# Patient Record
Sex: Female | Born: 1955
Health system: Southern US, Community
[De-identification: ages and names within clinical notes are randomized; demographics above are authoritative.]

## PROBLEM LIST (undated history)

## (undated) DIAGNOSIS — E785 Hyperlipidemia, unspecified: Secondary | ICD-10-CM

## (undated) DIAGNOSIS — E559 Vitamin D deficiency, unspecified: Secondary | ICD-10-CM

## (undated) DIAGNOSIS — Z973 Presence of spectacles and contact lenses: Secondary | ICD-10-CM

## (undated) DIAGNOSIS — G8929 Other chronic pain: Secondary | ICD-10-CM

## (undated) DIAGNOSIS — G2581 Restless legs syndrome: Secondary | ICD-10-CM

## (undated) DIAGNOSIS — K219 Gastro-esophageal reflux disease without esophagitis: Secondary | ICD-10-CM

## (undated) DIAGNOSIS — T7840XA Allergy, unspecified, initial encounter: Secondary | ICD-10-CM

## (undated) DIAGNOSIS — G43909 Migraine, unspecified, not intractable, without status migrainosus: Secondary | ICD-10-CM

## (undated) DIAGNOSIS — U071 COVID-19: Secondary | ICD-10-CM

## (undated) DIAGNOSIS — N83209 Unspecified ovarian cyst, unspecified side: Secondary | ICD-10-CM

## (undated) DIAGNOSIS — Z860101 Personal history of adenomatous and serrated colon polyps: Secondary | ICD-10-CM

## (undated) DIAGNOSIS — I8390 Asymptomatic varicose veins of unspecified lower extremity: Secondary | ICD-10-CM

## (undated) DIAGNOSIS — N838 Other noninflammatory disorders of ovary, fallopian tube and broad ligament: Secondary | ICD-10-CM

## (undated) DIAGNOSIS — N950 Postmenopausal bleeding: Secondary | ICD-10-CM

## (undated) DIAGNOSIS — R011 Cardiac murmur, unspecified: Secondary | ICD-10-CM

## (undated) DIAGNOSIS — J302 Other seasonal allergic rhinitis: Secondary | ICD-10-CM

## (undated) DIAGNOSIS — M8580 Other specified disorders of bone density and structure, unspecified site: Secondary | ICD-10-CM

## (undated) HISTORY — PX: DILATION AND CURETTAGE OF UTERUS: SHX78

## (undated) HISTORY — DX: Unspecified ovarian cyst, unspecified side: N83.209

## (undated) HISTORY — DX: Migraine, unspecified, not intractable, without status migrainosus: G43.909

## (undated) HISTORY — PX: APPENDECTOMY: SHX54

## (undated) HISTORY — DX: Vitamin D deficiency, unspecified: E55.9

## (undated) HISTORY — DX: Restless legs syndrome: G25.81

## (undated) HISTORY — DX: COVID-19: U07.1

## (undated) HISTORY — DX: Asymptomatic varicose veins of unspecified lower extremity: I83.90

## (undated) HISTORY — PX: BREAST BIOPSY: SHX20

## (undated) HISTORY — DX: Allergy, unspecified, initial encounter: T78.40XA

## (undated) HISTORY — PX: POLYPECTOMY: SHX149

## (undated) HISTORY — DX: Cardiac murmur, unspecified: R01.1

---

## 1989-05-12 HISTORY — PX: APPENDECTOMY: SHX54

## 2008-05-12 HISTORY — PX: CARPAL TUNNEL RELEASE: SHX101

## 2009-05-12 HISTORY — PX: FOOT SURGERY: SHX648

## 2009-05-12 HISTORY — PX: BUNIONECTOMY: SHX129

## 2010-05-12 HISTORY — PX: BREAST SURGERY: SHX581

## 2010-05-12 LAB — HM COLONOSCOPY: HM Colonoscopy: NORMAL

## 2011-02-12 DIAGNOSIS — I8390 Asymptomatic varicose veins of unspecified lower extremity: Secondary | ICD-10-CM | POA: Insufficient documentation

## 2011-02-12 DIAGNOSIS — J302 Other seasonal allergic rhinitis: Secondary | ICD-10-CM | POA: Insufficient documentation

## 2011-02-12 DIAGNOSIS — N926 Irregular menstruation, unspecified: Secondary | ICD-10-CM | POA: Insufficient documentation

## 2011-02-12 DIAGNOSIS — K219 Gastro-esophageal reflux disease without esophagitis: Secondary | ICD-10-CM | POA: Insufficient documentation

## 2011-10-13 DIAGNOSIS — K581 Irritable bowel syndrome with constipation: Secondary | ICD-10-CM | POA: Insufficient documentation

## 2013-07-10 LAB — HM MAMMOGRAPHY

## 2014-09-04 ENCOUNTER — Encounter: Payer: Self-pay | Admitting: *Deleted

## 2014-09-04 ENCOUNTER — Telehealth: Payer: Self-pay | Admitting: *Deleted

## 2014-09-04 NOTE — Telephone Encounter (Signed)
Pre-Visit Call completed with patient and chart updated.   Pre-Visit Info documented in Specialty Comments under SnapShot.    

## 2014-09-04 NOTE — Addendum Note (Signed)
Addended by: Leticia Penna A on: 09/04/2014 02:27 PM   Modules accepted: Medications

## 2014-09-04 NOTE — Telephone Encounter (Signed)
Unable to reach patient at time of Pre-Visit Call.  Left message for patient to return call when available.    

## 2014-09-05 ENCOUNTER — Ambulatory Visit (INDEPENDENT_AMBULATORY_CARE_PROVIDER_SITE_OTHER): Payer: Federal, State, Local not specified - PPO | Admitting: Family

## 2014-09-05 ENCOUNTER — Encounter: Payer: Self-pay | Admitting: *Deleted

## 2014-09-05 VITALS — BP 120/86 | HR 85 | Temp 98.0°F | Resp 16 | Ht 64.25 in | Wt 134.6 lb

## 2014-09-05 DIAGNOSIS — G43909 Migraine, unspecified, not intractable, without status migrainosus: Secondary | ICD-10-CM | POA: Insufficient documentation

## 2014-09-05 DIAGNOSIS — N3000 Acute cystitis without hematuria: Secondary | ICD-10-CM

## 2014-09-05 DIAGNOSIS — N390 Urinary tract infection, site not specified: Secondary | ICD-10-CM | POA: Insufficient documentation

## 2014-09-05 DIAGNOSIS — I872 Venous insufficiency (chronic) (peripheral): Secondary | ICD-10-CM

## 2014-09-05 DIAGNOSIS — N83209 Unspecified ovarian cyst, unspecified side: Secondary | ICD-10-CM

## 2014-09-05 DIAGNOSIS — R300 Dysuria: Secondary | ICD-10-CM

## 2014-09-05 DIAGNOSIS — N8320 Unspecified ovarian cysts: Secondary | ICD-10-CM

## 2014-09-05 DIAGNOSIS — G43809 Other migraine, not intractable, without status migrainosus: Secondary | ICD-10-CM

## 2014-09-05 LAB — POCT URINALYSIS DIPSTICK
Bilirubin, UA: NEGATIVE
Blood, UA: NEGATIVE
Glucose, UA: NEGATIVE
Ketones, UA: NEGATIVE
Nitrite, UA: NEGATIVE
Protein, UA: NEGATIVE
Spec Grav, UA: 1.01
Urobilinogen, UA: NEGATIVE
pH, UA: 7.5

## 2014-09-05 MED ORDER — RIZATRIPTAN BENZOATE 10 MG PO TABS: 10.0000 mg | ORAL_TABLET | ORAL | Status: DC | PRN

## 2014-09-05 MED ORDER — CIPROFLOXACIN HCL 500 MG PO TABS: 500.0000 mg | ORAL_TABLET | Freq: Two times a day (BID) | ORAL | Status: DC

## 2014-09-05 NOTE — Assessment & Plan Note (Signed)
UA + nitrite, will send for culture, start cipro.

## 2014-09-05 NOTE — Patient Instructions (Signed)
You will be contacted about your referral to vascular and to GYN. Please schedule a complete physical at your convenience. Welcome to [facility]!

## 2014-09-05 NOTE — Progress Notes (Signed)
Subjective:    Patient ID: Joanna Lambert, female    DOB: November 14, 1955, 59 y.o.   MRN: [id number]  HPI  Joanna Lambert is a 59 yr old female who presents today to establish care.    Dysuria-  Reports dysuria since last night. Urinalysis shows positive leuks.  Reports that it feels likey her urinary stream is "very fine and not coming out the right way.  Notes a general lower abdominal discomfort. Denies hematuria, fever or unusual low back pain.    Hx ovarian cyst- reports hx of 5cm ovarian cyst back about 1.5 years ago. Was told "no concern of CA" had serial ultrasounds and reports that [place] went down to 1 cm.    Circulation concern-  Reports that she had bilateral LE ultrasounds of her LE extremities and was told that she should have endovenous laser therapy.    Migraines-reports hx of "menstrual migraines". Less frequent since menopause but lasts 3 days instead of 1 day like it used to. Now has one every 6 weeks. Reports that she does get relief from maxalt but usually needs 2 doses.     Review of Systems  Constitutional: Negative for unexpected weight change.  HENT: Negative for hearing loss and rhinorrhea.   Eyes: Negative for visual disturbance.  Respiratory: Negative for cough.   Cardiovascular: Negative for leg swelling.  Gastrointestinal: Negative for nausea, diarrhea and constipation.  Genitourinary: Positive for dysuria. Negative for hematuria.  Musculoskeletal: Negative for myalgias and arthralgias.  Skin: Negative for rash.  Neurological: Positive for headaches.  Hematological: Negative for adenopathy.  Psychiatric/Behavioral: Negative for dysphoric mood and agitation.   Past Medical History  Diagnosis Date  . Allergy     seasonal  . Migraines   . Ovarian cyst     right  . Restless legs     History   Social History  . Marital Status: Married    Spouse Name: N/A  . Number of Children: N/A  . Years of Education: N/A   Occupational History  . Not on file.   Social  History Main Topics  . Smoking status: Never Smoker   . Smokeless tobacco: Never Used  . Alcohol Use: 1.2 oz/week    2 Glasses of wine per week  . Drug Use: No  . Sexual Activity: Not on file   Other Topics Concern  . 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      Past Surgical History  Procedure Laterality Date  . Foot surgery Right 2011    bunion removal  . Appendectomy    . Breast surgery Right 2012    biopsy, benign cyst  . Carpal tunnel release Bilateral 2010    Family History  Problem Relation Age of Onset  . Diabetes Mother   . Hypertension Mother 54  . Hyperlipidemia Mother   . Heart disease Father 14    pacemaker  . Atrial fibrillation Father 33    Allergies  Allergen Reactions  . Adhesive [Tape] Rash    Current Outpatient Prescriptions on File Prior to Visit  Medication Sig Dispense Refill  . aspirin 81 MG tablet Take 81 mg by mouth daily.    . Cholecalciferol (VITAMIN D) 2000 UNITS tablet Take 2,000 Units by mouth daily.    Marland Kitchen  rizatriptan (MAXALT) 10 MG tablet Take 10 mg by mouth as needed for migraine. May repeat in 2 hours if needed     No current facility-administered medications on file prior to visit.    BP 120/86 mmHg  Pulse 85  Temp(Src) 98 F (36.7 C) (Oral)  Resp 16  Ht 5' 4.25" (1.632 m)  Wt 134 lb 9.6 oz (61.054 kg)  BMI 22.92 kg/m2  SpO2 99%  LMP 05/12/2013       Objective:   Physical Exam  Constitutional: She is oriented to person, place, and time. She appears well-developed and well-nourished.  HENT:  Head: Normocephalic and atraumatic.  Right Ear: Tympanic membrane and ear canal normal.  Left Ear: Tympanic membrane and ear canal normal.  Cardiovascular: Normal rate, regular rhythm and normal heart sounds.   No murmur heard. Pulmonary/Chest:  Effort normal and breath sounds normal. No respiratory distress. She has no wheezes.  Abdominal: Soft. Bowel sounds are normal. She exhibits no distension. There is no tenderness.  Musculoskeletal: She exhibits no edema.  Lymphadenopathy:    She has no cervical adenopathy.  Neurological: She is alert and oriented to person, place, and time.  Skin: Skin is warm and dry.  Psychiatric: She has a normal mood and affect. Her behavior is normal. Judgment and thought content normal.          Assessment & Plan:

## 2014-09-05 NOTE — Assessment & Plan Note (Signed)
Will refer to GYN for surveillance.

## 2014-09-05 NOTE — Assessment & Plan Note (Signed)
Refer to vascular 

## 2014-09-05 NOTE — Progress Notes (Signed)
Pre visit review using our clinic review tool, if applicable. No additional management support is needed unless otherwise documented below in the visit note. 

## 2014-09-05 NOTE — Assessment & Plan Note (Signed)
Stable.  Continue Maxalt.

## 2014-09-08 LAB — URINE CULTURE: Colony Count: >=100000

## 2014-10-06 ENCOUNTER — Other Ambulatory Visit: Payer: Self-pay

## 2014-10-06 DIAGNOSIS — I872 Venous insufficiency (chronic) (peripheral): Secondary | ICD-10-CM

## 2014-10-11 ENCOUNTER — Encounter: Payer: Self-pay | Admitting: Vascular Surgery

## 2014-10-11 ENCOUNTER — Ambulatory Visit (INDEPENDENT_AMBULATORY_CARE_PROVIDER_SITE_OTHER): Payer: Federal, State, Local not specified - PPO | Admitting: Women's Health

## 2014-10-11 ENCOUNTER — Encounter (HOSPITAL_COMMUNITY): Payer: Self-pay

## 2014-10-11 ENCOUNTER — Other Ambulatory Visit (HOSPITAL_COMMUNITY)
Admission: RE | Admit: 2014-10-11 | Discharge: 2014-10-11 | Disposition: A | Discharge status: Home / Self Care | Payer: Federal, State, Local not specified - PPO | Source: Ambulatory Visit | Attending: Gynecology | Admitting: Gynecology

## 2014-10-11 ENCOUNTER — Encounter: Payer: Self-pay | Admitting: Women's Health

## 2014-10-11 VITALS — BP 128/80 | Ht 64.0 in | Wt 134.0 lb

## 2014-10-11 DIAGNOSIS — N8320 Unspecified ovarian cysts: Secondary | ICD-10-CM

## 2014-10-11 DIAGNOSIS — Z01419 Encounter for gynecological examination (general) (routine) without abnormal findings: Secondary | ICD-10-CM

## 2014-10-11 DIAGNOSIS — N898 Other specified noninflammatory disorders of vagina: Secondary | ICD-10-CM

## 2014-10-11 DIAGNOSIS — N83201 Unspecified ovarian cyst, right side: Secondary | ICD-10-CM

## 2014-10-11 MED ORDER — ESTRADIOL 2 MG VA RING: 2.0000 mg | VAGINAL_RING | VAGINAL | Status: DC

## 2014-10-11 NOTE — Progress Notes (Signed)
Joanna Lambert [birth year] [id number]    History:    Presents for annual exam.  Postmenopausal/no bleeding 2 years/no HRT. Recently moved here from [place]. Reports history of a 5 cm cyst that decreased to 1 cm and was to have a follow-up ultrasound November 2016. Reports normal Pap and mammogram history. History of IBS, had first colonoscopy in her 57s with numerous benign polyps, 3 years later no polyps and is due for  colonoscopy next year. Reports normal DEXA 2015. History of  breast cysts with  benign breast aspiration. History of menstrual migraines.  Past medical history, past surgical history, family history and social history were all reviewed and documented in the EPIC chart. Retired, moved here to be closer to daughter (PA at [employer]) and help care for grandchildren. Mother diabetes, hypertension and increased cholesterol, father heart disease.  ROS:  A ROS was performed and pertinent positives and negatives are included.  Exam:  Filed Vitals:   10/11/14 1508  BP: 128/80    General appearance:  Normal Thyroid:  Symmetrical, normal in size, without palpable masses or nodularity. Respiratory  Auscultation:  Clear without wheezing or rhonchi Cardiovascular  Auscultation:  Regular rate, without rubs, murmurs or gallops  Edema/varicosities:  Not grossly evident Abdominal  Soft,nontender, without masses, guarding or rebound.  Liver/spleen:  No organomegaly noted  Hernia:  None appreciated  Skin  Inspection:  Grossly normal   Breasts: Examined lying and sitting.     Right: Without masses, retractions, discharge or axillary adenopathy.     Left: Without masses, retractions, discharge or axillary adenopathy. Gentitourinary   Inguinal/mons:  Normal without inguinal adenopathy  External genitalia:  Normal  BUS/Urethra/Skene's glands:  Normal  Vagina:  Normal  Cervix:  Normal  Uterus:   normal in size, shape and contour.  Midline and  mobile  Adnexa/parametria:     Rt: Without masses or tenderness.   Lt: Without masses or tenderness.  Anus and perineum: Normal  Digital rectal exam: Normal sphincter tone without palpated masses or tenderness  Assessment/Plan:  59 y.o.MWF G2P2  for annual exam, follow-up of reported 1 cm right ovarian cyst and complaint of vaginal dryness.  Postmenopausal/name HRT/no bleeding with vaginal dryness IBS with history of benign colon polyps History of menstrual migraines  Plan: Ultrasound for right ovarian cyst November 2016, will schedule. Vaginal dryness reviewed, encouraged over-the-counter vaginal lubricants, HRT, Estring reviewed, will try Estring per vagina every 3 months, reviewed minimal systemic absorption. Instructed to call if no relief of dryness. SBE's, annual screening mammogram, breast center information given instructed to schedule. [facility] number given to schedule follow-up colonoscopy. Regular exercise, calcium rich diet, vitamin D 2000 daily encouraged. Reports normal vitamin D level. UA, Pap. (Reports normal Pap with negative HR HPV typing 2015). Instructed to have records sent.   Huel Cote [facility], 5:44 PM 10/11/2014

## 2014-10-11 NOTE — Patient Instructions (Signed)
Health Recommendations for Postmenopausal Women Respected and ongoing research has looked at the most common causes of death, disability, and poor quality of life in postmenopausal women. The causes include heart disease, diseases of blood vessels, diabetes, depression, cancer, and bone loss (osteoporosis). Many things can be done to help lower the chances of developing these and other common problems. CARDIOVASCULAR DISEASE Heart Disease: A heart attack is a medical emergency. Know the signs and symptoms of a heart attack. Below are things women can do to reduce their risk for heart disease.   Do not smoke. If you smoke, quit.  Aim for a healthy weight. Being overweight causes many preventable deaths. Eat a healthy and balanced diet and drink an adequate amount of liquids.  Get moving. Make a commitment to be more physically active. Aim for 30 minutes of activity on most, if not all days of the week.  Eat for heart health. Choose a diet that is low in saturated fat and cholesterol and eliminate trans fat. Include whole grains, vegetables, and fruits. Read and understand the labels on food containers before buying.  Know your numbers. Ask your caregiver to check your blood pressure, cholesterol (total, HDL, LDL, triglycerides) and blood glucose. Work with your caregiver on improving your entire clinical picture.  High blood pressure. Limit or stop your table salt intake (try salt substitute and food seasonings). Avoid salty foods and drinks. Read labels on food containers before buying. Eating well and exercising can help control high blood pressure. STROKE  Stroke is a medical emergency. Stroke may be the result of a blood clot in a blood vessel in the brain or by a brain hemorrhage (bleeding). Know the signs and symptoms of a stroke. To lower the risk of developing a stroke:  Avoid fatty foods.  Quit smoking.  Control your diabetes, blood pressure, and irregular heart rate. THROMBOPHLEBITIS  (BLOOD CLOT) OF THE LEG  Becoming overweight and leading a stationary lifestyle may also contribute to developing blood clots. Controlling your diet and exercising will help lower the risk of developing blood clots. CANCER SCREENING  Breast Cancer: Take steps to reduce your risk of breast cancer.  You should practice "breast self-awareness." This means understanding the normal appearance and feel of your breasts and should include breast self-examination. Any changes detected, no matter how small, should be reported to your caregiver.  After age 40, you should have a clinical breast exam (CBE) every year.  Starting at age 40, you should consider having a mammogram (breast X-ray) every year.  If you have a family history of breast cancer, talk to your caregiver about genetic screening.  If you are at high risk for breast cancer, talk to your caregiver about having an MRI and a mammogram every year.  Intestinal or Stomach Cancer: Tests to consider are a rectal exam, fecal occult blood, sigmoidoscopy, and colonoscopy. Women who are high risk may need to be screened at an earlier age and more often.  Cervical Cancer:  Beginning at age 30, you should have a Pap test every 3 years as long as the past 3 Pap tests have been normal.  If you have had past treatment for cervical cancer or a condition that could lead to cancer, you need Pap tests and screening for cancer for at least 20 years after your treatment.  If you had a hysterectomy for a problem that was not cancer or a condition that could lead to cancer, then you no longer need Pap tests.    If you are between ages 65 and 70, and you have had normal Pap tests going back 10 years, you no longer need Pap tests.  If Pap tests have been discontinued, risk factors (such as a new sexual partner) need to be reassessed to determine if screening should be resumed.  Some medical problems can increase the chance of getting cervical cancer. In these  cases, your caregiver may recommend more frequent screening and Pap tests.  Uterine Cancer: If you have vaginal bleeding after reaching menopause, you should notify your caregiver.  Ovarian Cancer: Other than yearly pelvic exams, there are no reliable tests available to screen for ovarian cancer at this time except for yearly pelvic exams.  Lung Cancer: Yearly chest X-rays can detect lung cancer and should be done on high risk women, such as cigarette smokers and women with chronic lung disease (emphysema).  Skin Cancer: A complete body skin exam should be done at your yearly examination. Avoid overexposure to the sun and ultraviolet light lamps. Use a strong sun block cream when in the sun. All of these things are important for lowering the risk of skin cancer. MENOPAUSE Menopause Symptoms: Hormone therapy products are effective for treating symptoms associated with menopause:  Moderate to severe hot flashes.  Night sweats.  Mood swings.  Headaches.  Tiredness.  Loss of sex drive.  Insomnia.  Other symptoms. Hormone replacement carries certain risks, especially in older women. Women who use or are thinking about using estrogen or estrogen with progestin treatments should discuss that with their caregiver. Your caregiver will help you understand the benefits and risks. The ideal dose of hormone replacement therapy is not known. The Food and Drug Administration (FDA) has concluded that hormone therapy should be used only at the lowest doses and for the shortest amount of time to reach treatment goals.  OSTEOPOROSIS Protecting Against Bone Loss and Preventing Fracture If you use hormone therapy for prevention of bone loss (osteoporosis), the risks for bone loss must outweigh the risk of the therapy. Ask your caregiver about other medications known to be safe and effective for preventing bone loss and fractures. To guard against bone loss or fractures, the following is recommended:  If  you are younger than age 50, take 1000 mg of calcium and at least 600 mg of Vitamin D per day.  If you are older than age 50 but younger than age 70, take 1200 mg of calcium and at least 600 mg of Vitamin D per day.  If you are older than age 70, take 1200 mg of calcium and at least 800 mg of Vitamin D per day. Smoking and excessive alcohol intake increases the risk of osteoporosis. Eat foods rich in calcium and vitamin D and do weight bearing exercises several times a week as your caregiver suggests. DIABETES Diabetes Mellitus: If you have type I or type 2 diabetes, you should keep your blood sugar under control with diet, exercise, and recommended medication. Avoid starchy and fatty foods, and too many sweets. Being overweight can make diabetes control more difficult. COGNITION AND MEMORY Cognition and Memory: Menopausal hormone therapy is not recommended for the prevention of cognitive disorders such as Alzheimer's disease or memory loss.  DEPRESSION  Depression may occur at any age, but it is common in elderly women. This may be because of physical, medical, social (loneliness), or financial problems and needs. If you are experiencing depression because of medical problems and control of symptoms, talk to your caregiver about this. Physical   activity and exercise may help with mood and sleep. Community and volunteer involvement may improve your sense of value and worth. If you have depression and you feel that the problem is getting worse or becoming severe, talk to your caregiver about which treatment options are best for you. ACCIDENTS  Accidents are common and can be serious in elderly woman. Prepare your house to prevent accidents. Eliminate throw rugs, place hand bars in bath, shower, and toilet areas. Avoid wearing high heeled shoes or walking on wet, snowy, and icy areas. Limit or stop driving if you have vision or hearing problems, or if you feel you are unsteady with your movements and  reflexes. HEPATITIS C Hepatitis C is a type of viral infection affecting the liver. It is spread mainly through contact with blood from an infected person. It can be treated, but if left untreated, it can lead to severe liver damage over the years. Many people who are infected do not know that the virus is in their blood. If you are a "baby-boomer", it is recommended that you have one screening test for Hepatitis C. IMMUNIZATIONS  Several immunizations are important to consider having during your senior years, including:   Tetanus, diphtheria, and pertussis booster shot.  Influenza every year before the flu season begins.  Pneumonia vaccine.  Shingles vaccine.  Others, as indicated based on your specific needs. Talk to your caregiver about these. Document Released: 06/20/2005 Document Revised: 09/12/2013 Document Reviewed: 02/14/2008 ExitCare Patient Information 2015 [employer]. This information is not intended to replace advice given to you by your health care provider. Make sure you discuss any questions you have with your health care provider.  

## 2014-10-13 LAB — CYTOLOGY - PAP

## 2014-10-16 ENCOUNTER — Encounter: Payer: Self-pay | Admitting: Vascular Surgery

## 2014-10-17 ENCOUNTER — Ambulatory Visit (INDEPENDENT_AMBULATORY_CARE_PROVIDER_SITE_OTHER): Payer: Federal, State, Local not specified - PPO | Admitting: Vascular Surgery

## 2014-10-17 ENCOUNTER — Encounter: Payer: Self-pay | Admitting: Vascular Surgery

## 2014-10-17 VITALS — BP 140/79 | HR 76 | Resp 14 | Ht 64.0 in | Wt 135.0 lb

## 2014-10-17 DIAGNOSIS — I87303 Chronic venous hypertension (idiopathic) without complications of bilateral lower extremity: Secondary | ICD-10-CM

## 2014-10-17 NOTE — Progress Notes (Signed)
Patient name: Joanna Lambert MRN: [id number] DOB: 01/25/1956 Sex: female     Reason for referral:  Chief Complaint  Patient presents with  . New Evaluation    bilateral varicose veins    HISTORY OF PRESENT ILLNESS: The patient presents today for discussion of venous pathology in both lower extremities. She is a very pleasant 59 year old female who recently moved to our area to help care for grandchildren. She retired from the position in [place]. While there, she underwent evaluation with a formal duplex showing reflux in both saphenous veins. Both were dilated with the left being more dilated than the right. She reports symptoms of  Achy sensation in both legs with prolonged sitting and standing. Also caused her to have restless legs at night. Since retiring her symptoms have improved somewhat. She had been scheduled for laser ablation of her left great saphenous vein in [place]. Denies any history of DVT or venous stasis ulceration. Does not have any significant lower extremity swelling  Past Medical History  Diagnosis Date  . Allergy     seasonal  . Migraines   . Ovarian cyst     right  . Restless legs   . Varicose veins     Past Surgical History  Procedure Laterality Date  . Foot surgery Right 2011    bunion removal  . Appendectomy    . Breast surgery Right 2012    biopsy, benign cyst  . Carpal tunnel release Bilateral 2010    History   Social History  . Marital Status: Married    Spouse Name: N/A  . Number of Children: N/A  . Years of Education: N/A   Occupational History  . Not on file.   Social History Main Topics  . Smoking status: Never Smoker   . Smokeless tobacco: Never Used  . Alcohol Use: 1.2 oz/week    2 Glasses of wine per week  . Drug Use: No  . Sexual Activity: Not on file   Other Topics Concern  . Not on file   Social History Narrative   Married   2 children (daughter lives here- she has two sons)   Son lives in [place]   Retired from  Teaching laboratory technician (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      Family History  Problem Relation Age of Onset  . Diabetes Mother   . Hypertension Mother 86  . Hyperlipidemia Mother   . Heart disease Father 64    pacemaker  . Atrial fibrillation Father 1    Allergies as of 10/17/2014 - Review Complete 10/17/2014  Allergen Reaction Noted  . Adhesive [tape] Rash 09/04/2014    Current Outpatient Prescriptions on File Prior to Visit  Medication Sig Dispense Refill  . aspirin 81 MG tablet Take 81 mg by mouth daily.    . Cholecalciferol (VITAMIN D) 2000 UNITS tablet Take 2,000 Units by mouth daily.    Marland Kitchen estradiol (ESTRING) 2 MG vaginal ring Place 2 mg vaginally every 3 (three) months. follow package directions 1 each 4  . rizatriptan (MAXALT) 10 MG tablet Take 1 tablet (10 mg total) by mouth as needed for migraine. May repeat in 2 hours if needed 10 tablet 5   No current facility-administered medications on file prior to visit.     REVIEW OF SYSTEMS:  Positives indicated with an "X"  CARDIOVASCULAR:  [ ]  chest pain   [ ]  chest pressure   [ ]  palpitations   [ ]   orthopnea   [ ]  dyspnea on exertion   [ ]  claudication   [ ]  rest pain   [ ]  DVT   [ ]  phlebitis PULMONARY:   [ ]  productive cough   [ ]  asthma   [ ]  wheezing NEUROLOGIC:   [ ]  weakness  [ ]  paresthesias  [ ]  aphasia  [ ]  amaurosis  [ ]  dizziness HEMATOLOGIC:   [ ]  bleeding problems   [ ]  clotting disorders MUSCULOSKELETAL:  [ ]  joint pain   [ ]  joint swelling GASTROINTESTINAL: [ ]   blood in stool  [ ]   hematemesis GENITOURINARY:  [ ]   dysuria  [ ]   hematuria PSYCHIATRIC:  [ ]  history of major depression INTEGUMENTARY:  [ ]  rashes  [ ]  ulcers CONSTITUTIONAL:  [ ]  fever   [ ]  chills  PHYSICAL EXAMINATION:  General: The patient is a well-nourished female, in no acute distress. Vital signs are BP 140/79 mmHg  Pulse 76  Resp 14  Ht 5\' 4"  (1.626 m)  Wt 135 lb (61.236 kg)  BMI 23.16 kg/m2  LMP  05/12/2013 Pulmonary: There is a good air exchange   Musculoskeletal: There are no major deformities.  There is no significant extremity pain. Neurologic: No focal weakness or paresthesias are detected, Skin: There are no ulcer or rashes noted. Psychiatric: The patient has normal affect. Cardiovascular: Apical dorsalis pedis pulses bilaterally No lower extremity swelling or varicosities or telangiectasia   I imaged her veins with SonoSite ultrasound. This does show some distention of her great saphenous veins bilaterally.  Impression and Plan:  I did review her prior venous duplex from [place]. Discussed this with the patient. Explained that this does not put her any increased risk for DVT or other more serious venous complications. Achy sensation may be improved by ablation of her saphenous vein. She reports that she is comfortable with observation only. I explained that there should be no risk from her venous reflux. Explained that this may progress over time and associated treated. She is comfortable with observation only and will notify should she wish to proceed with further treatment    Imo Cumbie [facility] Office: [phone]

## 2014-11-06 ENCOUNTER — Telehealth: Payer: Self-pay | Admitting: *Deleted

## 2014-11-06 NOTE — Telephone Encounter (Signed)
When was nuva ring picked up? Was it recent?

## 2014-11-06 NOTE — Telephone Encounter (Signed)
Joanna Lambert the pharmacist Kim at [facility] and said that they gave patient the wrong Rx. They gave her nuvaring rather than estring 2mg , they have called the patient and left a message for her to call, I called her as well and told her to contact [facility]. The pharmacist asked me to make you aware of this.

## 2014-11-07 NOTE — Telephone Encounter (Signed)
She did pick up nuvaring , I'm not sure if she used it, I didn't personally speak with patient I only called her to call the pharmacy yesterday.

## 2014-11-07 NOTE — Telephone Encounter (Signed)
She had annual exams 10/11/2014 when was then NuvaRing picked up? Did they say?

## 2014-11-07 NOTE — Telephone Encounter (Signed)
Yes patient picked up Rx, Spoke with [facility] pharmacist and pt called back and is aware of the mix up with pharmacy. They have to order estring 2mg .

## 2014-12-12 ENCOUNTER — Ambulatory Visit: Payer: Self-pay | Admitting: Family

## 2014-12-13 ENCOUNTER — Encounter: Payer: Self-pay | Admitting: Family

## 2014-12-25 ENCOUNTER — Telehealth: Payer: Self-pay | Admitting: *Deleted

## 2014-12-25 NOTE — Telephone Encounter (Signed)
Unable to reach patient at time of Pre-Visit Call.  Left message for patient to return call when available.   (Pre-Visit Call completed recently- pre-visit info from chart updated in SnapShot)

## 2014-12-26 ENCOUNTER — Encounter: Payer: Self-pay | Admitting: Family

## 2014-12-26 ENCOUNTER — Ambulatory Visit (INDEPENDENT_AMBULATORY_CARE_PROVIDER_SITE_OTHER): Payer: Federal, State, Local not specified - PPO | Admitting: Family

## 2014-12-26 ENCOUNTER — Other Ambulatory Visit: Payer: Self-pay | Admitting: Family

## 2014-12-26 VITALS — BP 118/82 | HR 71 | Temp 98.1°F | Resp 16 | Ht 64.25 in | Wt 132.0 lb

## 2014-12-26 DIAGNOSIS — Z0000 Encounter for general adult medical examination without abnormal findings: Secondary | ICD-10-CM | POA: Diagnosis not present

## 2014-12-26 DIAGNOSIS — Z7185 Encounter for immunization safety counseling: Secondary | ICD-10-CM | POA: Insufficient documentation

## 2014-12-26 DIAGNOSIS — E559 Vitamin D deficiency, unspecified: Secondary | ICD-10-CM | POA: Diagnosis not present

## 2014-12-26 DIAGNOSIS — E2839 Other primary ovarian failure: Secondary | ICD-10-CM

## 2014-12-26 HISTORY — DX: Vitamin D deficiency, unspecified: E55.9

## 2014-12-26 LAB — LIPID PANEL
Cholesterol: 214 mg/dL — ABNORMAL HIGH (ref 0–200)
HDL: 51.6 mg/dL (ref >39.00)
LDL Cholesterol: 143 mg/dL — ABNORMAL HIGH (ref 0–99)
NonHDL: 162.34
Total CHOL/HDL Ratio: 4
Triglycerides: 98 mg/dL (ref 0.0–149.0)
VLDL: 19.6 mg/dL (ref 0.0–40.0)

## 2014-12-26 LAB — BASIC METABOLIC PANEL
BUN: 16 mg/dL (ref 6–23)
CO2: 32 mEq/L (ref 19–32)
Calcium: 9.5 mg/dL (ref 8.4–10.5)
Chloride: 101 mEq/L (ref 96–112)
Creatinine, Ser: 0.89 mg/dL (ref 0.40–1.20)
GFR: 69.42 mL/min (ref >60.00)
Glucose, Bld: 92 mg/dL (ref 70–99)
Potassium: 3.8 mEq/L (ref 3.5–5.1)
Sodium: 138 mEq/L (ref 135–145)

## 2014-12-26 LAB — URINALYSIS, ROUTINE W REFLEX MICROSCOPIC
Bilirubin Urine: NEGATIVE
Hgb urine dipstick: NEGATIVE
Ketones, ur: NEGATIVE
Leukocytes, UA: NEGATIVE
Nitrite: NEGATIVE
RBC / HPF: NONE SEEN (ref 0–?)
Specific Gravity, Urine: 1.015 (ref 1.000–1.030)
Total Protein, Urine: NEGATIVE
Urine Glucose: NEGATIVE
Urobilinogen, UA: 0.2 (ref 0.0–1.0)
pH: 6 (ref 5.0–8.0)

## 2014-12-26 LAB — CBC WITH DIFFERENTIAL/PLATELET
Basophils Absolute: 0 10*3/uL (ref 0.0–0.1)
Basophils Relative: 0.9 % (ref 0.0–3.0)
Eosinophils Absolute: 0.1 10*3/uL (ref 0.0–0.7)
Eosinophils Relative: 2.7 % (ref 0.0–5.0)
HCT: 40.8 % (ref 36.0–46.0)
Hemoglobin: 13.7 g/dL (ref 12.0–15.0)
Lymphocytes Relative: 36.4 % (ref 12.0–46.0)
Lymphs Abs: 1.8 10*3/uL (ref 0.7–4.0)
MCHC: 33.6 g/dL (ref 30.0–36.0)
MCV: 90.3 fl (ref 78.0–100.0)
Monocytes Absolute: 0.5 10*3/uL (ref 0.1–1.0)
Monocytes Relative: 10.8 % (ref 3.0–12.0)
Neutro Abs: 2.5 10*3/uL (ref 1.4–7.7)
Neutrophils Relative %: 49.2 % (ref 43.0–77.0)
Platelets: 252 10*3/uL (ref 150.0–400.0)
RBC: 4.52 Mil/uL (ref 3.87–5.11)
RDW: 12.8 % (ref 11.5–15.5)
WBC: 5.1 10*3/uL (ref 4.0–10.5)

## 2014-12-26 LAB — HEPATIC FUNCTION PANEL
ALT: 18 U/L (ref 0–35)
AST: 22 U/L (ref 0–37)
Albumin: 4.5 g/dL (ref 3.5–5.2)
Alkaline Phosphatase: 75 U/L (ref 39–117)
Bilirubin, Direct: 0.1 mg/dL (ref 0.0–0.3)
Total Bilirubin: 0.5 mg/dL (ref 0.2–1.2)
Total Protein: 7.5 g/dL (ref 6.0–8.3)

## 2014-12-26 LAB — HEPATITIS C ANTIBODY: HCV Ab: NEGATIVE

## 2014-12-26 LAB — VITAMIN D 25 HYDROXY (VIT D DEFICIENCY, FRACTURES): VITD: 29.08 ng/mL — ABNORMAL LOW (ref 30.00–100.00)

## 2014-12-26 LAB — TSH: TSH: 2.31 u[IU]/mL (ref 0.35–4.50)

## 2014-12-26 MED ORDER — VITAMIN D (ERGOCALCIFEROL) 1.25 MG (50000 UNIT) PO CAPS: 50000.0000 [IU] | ORAL_CAPSULE | ORAL | Status: DC

## 2014-12-26 NOTE — Progress Notes (Signed)
Subjective:    Patient ID: Joanna Lambert, female    DOB: Jun 20, 1955, 59 y.o.   MRN: [id number]  HPI  Patient presents today for complete physical.  Immunizations: tetanus up to date Diet: reports healthy diet Exercise: walking Colonoscopy: 2012- follow up due 2017 Dexa: 2013 Pap Smear: 10/11/14 Mammogram:3/15 Vision- 2015 Dental:  Up to date  Review of Systems  Constitutional: Negative for unexpected weight change.  HENT: Negative for hearing loss and rhinorrhea.   Eyes: Negative for visual disturbance.  Respiratory: Negative for cough.   Cardiovascular: Negative for leg swelling.  Gastrointestinal: Negative for nausea, diarrhea and blood in stool.       Occasional constipation  Genitourinary: Negative for dysuria and frequency.  Musculoskeletal: Negative for myalgias and arthralgias.  Skin: Negative for rash.  Neurological:       Occasional migraines  Hematological: Negative for adenopathy.  Psychiatric/Behavioral: Negative for dysphoric mood and agitation.   Past Medical History  Diagnosis Date  . Allergy     seasonal  . Migraines   . Ovarian cyst     right  . Restless legs   . Varicose veins     Social History   Social History  . Marital Status: Married    Spouse Name: N/A  . Number of Children: N/A  . Years of Education: N/A   Occupational History  . Not on file.   Social History Main Topics  . Smoking status: Never Smoker   . Smokeless tobacco: Never Used  . Alcohol Use: 1.2 oz/week    2 Glasses of wine per week  . Drug Use: No  . Sexual Activity: Not on file   Other Topics Concern  . 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      Past Surgical History  Procedure Laterality Date  . Foot surgery Right 2011    bunion removal  . Appendectomy    . Breast surgery  Right 2012    biopsy, benign cyst  . Carpal tunnel release Bilateral 2010    Family History  Problem Relation Age of Onset  . Diabetes Mother   . Hypertension Mother 76  . Hyperlipidemia Mother   . Heart disease Father 38    pacemaker  . Atrial fibrillation Father 48    Allergies  Allergen Reactions  . Adhesive [Tape] Rash    Current Outpatient Prescriptions on File Prior to Visit  Medication Sig Dispense Refill  . aspirin 81 MG tablet Take 81 mg by mouth daily.    . Cholecalciferol (VITAMIN D) 2000 UNITS tablet Take 2,000 Units by mouth daily.    Marland Kitchen estradiol (ESTRING) 2 MG vaginal ring Place 2 mg vaginally every 3 (three) months. follow package directions 1 each 4  . rizatriptan (MAXALT) 10 MG tablet Take 1 tablet (10 mg total) by mouth as needed for migraine. May repeat in 2 hours if needed 10 tablet 5   No current facility-administered medications on file prior to visit.    BP 118/82 mmHg  Pulse 71  Temp(Src) 98.1 F (36.7 C) (Oral)  Resp 16  Ht 5' 4.25" (1.632 m)  Wt 132 lb (59.875 kg)  BMI 22.48 kg/m2  SpO2 99%  LMP 05/12/2013       Objective:   Physical Exam  Physical Exam  Constitutional: She is oriented to person, place, and time. She appears well-developed and well-nourished. No distress.  HENT:  Head: Normocephalic and atraumatic.  Right Ear: Tympanic membrane and ear canal normal.  Left Ear: Tympanic membrane and ear canal normal.  Mouth/Throat: Oropharynx is clear and moist.  Eyes: Pupils are equal, round, and reactive to light. No scleral icterus.  Neck: Normal range of motion. No thyromegaly present.  Cardiovascular: Normal rate and regular rhythm.   No murmur heard. Pulmonary/Chest: Effort normal and breath sounds normal. No respiratory distress. He has no wheezes. She has no rales. She exhibits no tenderness.  Abdominal: Soft. Bowel sounds are normal. He exhibits no distension and no mass. There is no tenderness. There is no rebound and no  guarding.  Musculoskeletal: She exhibits no edema.  Lymphadenopathy:    She has no cervical adenopathy.  Neurological: She is alert and oriented to person, place, and time. She has normal patellar reflexes. She exhibits normal muscle tone. Coordination normal.  Skin: Skin is warm and dry.  Psychiatric: She has a normal mood and affect. Her behavior is normal. Judgment and thought content normal.  Breasts: Examined lying Right: Without masses, retractions, discharge or axillary adenopathy.  Left: Without masses, retractions, discharge or axillary adenopathy.  Pelvic:  deferred         Assessment & Plan:         Assessment & Plan:

## 2014-12-26 NOTE — Assessment & Plan Note (Signed)
Immunizations reviewed and up to date.  Continue healthy diet, exercise.  Obtain routine labs.  EKG tracing is personally reviewed.  EKG notes NSR.  No acute changes.

## 2014-12-26 NOTE — Progress Notes (Signed)
Pre visit review using our clinic review tool, if applicable. No additional management support is needed unless otherwise documented below in the visit note. 

## 2014-12-26 NOTE — Patient Instructions (Addendum)
Please complete lab work prior to leaving. Continue healthy diet, exercise.  

## 2015-01-12 ENCOUNTER — Encounter: Payer: Self-pay | Admitting: Family Medicine

## 2015-01-12 ENCOUNTER — Ambulatory Visit (INDEPENDENT_AMBULATORY_CARE_PROVIDER_SITE_OTHER): Payer: Federal, State, Local not specified - PPO | Admitting: Family Medicine

## 2015-01-12 VITALS — BP 135/79 | HR 90 | Ht 64.0 in | Wt 130.0 lb

## 2015-01-12 DIAGNOSIS — M25541 Pain in joints of right hand: Secondary | ICD-10-CM

## 2015-01-12 DIAGNOSIS — M6530 Trigger finger, unspecified finger: Secondary | ICD-10-CM

## 2015-01-12 DIAGNOSIS — M79644 Pain in right finger(s): Secondary | ICD-10-CM | POA: Diagnosis not present

## 2015-01-12 MED ORDER — METHYLPREDNISOLONE ACETATE 40 MG/ML IJ SUSP
40.0000 mg | Freq: Once | INTRAMUSCULAR | Status: AC
  Administered 2015-01-12: 20 mg via INTRA_ARTICULAR

## 2015-01-16 DIAGNOSIS — M6530 Trigger finger, unspecified finger: Secondary | ICD-10-CM | POA: Insufficient documentation

## 2015-01-16 NOTE — Progress Notes (Signed)
PCP: Nance Pear., NP  Subjective:   HPI: Patient is a 59 y.o. female here for right thumb pain.  Patient reports over the past several weeks she's had worsening pain, catching of right thumb. Locking as well especially in the morning. Some swelling at base of thumb. No prior issues. Tried bracing. Is left handed.  Past Medical History  Diagnosis Date  . Allergy     seasonal  . Migraines   . Ovarian cyst     right  . Restless legs   . Varicose veins   . Vitamin D deficiency 12/26/2014    Current Outpatient Prescriptions on File Prior to Visit  Medication Sig Dispense Refill  . aspirin 81 MG tablet Take 81 mg by mouth daily.    . Cholecalciferol (VITAMIN D) 2000 UNITS tablet Take 2,000 Units by mouth daily.    Marland Kitchen estradiol (ESTRING) 2 MG vaginal ring Place 2 mg vaginally every 3 (three) months. follow package directions 1 each 4  . rizatriptan (MAXALT) 10 MG tablet Take 1 tablet (10 mg total) by mouth as needed for migraine. May repeat in 2 hours if needed 10 tablet 5  . Vitamin D, Ergocalciferol, (DRISDOL) 50000 UNITS CAPS capsule Take 1 capsule (50,000 Units total) by mouth every 7 (seven) days. 12 capsule 0   No current facility-administered medications on file prior to visit.    Past Surgical History  Procedure Laterality Date  . Foot surgery Right 2011    bunion removal  . Appendectomy    . Breast surgery Right 2012    biopsy, benign cyst  . Carpal tunnel release Bilateral 2010    Allergies  Allergen Reactions  . Adhesive [Tape] Rash    Social History   Social History  . Marital Status: Married    Spouse Name: N/A  . Number of Children: N/A  . Years of Education: N/A   Occupational History  . Not on file.   Social History Main Topics  . Smoking status: Never Smoker   . Smokeless tobacco: Never Used  . Alcohol Use: 1.2 oz/week    2 Glasses of wine per week  . Drug Use: No  . Sexual Activity: Not on file   Other Topics Concern  . 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      Family History  Problem Relation Age of Onset  . Diabetes Mother   . Hypertension Mother 72  . Hyperlipidemia Mother   . Heart disease Father 75    pacemaker  . Atrial fibrillation Father 75    BP 135/79 mmHg  Pulse 90  Ht 5\' 4"  (1.626 m)  Wt 130 lb (58.968 kg)  BMI 22.30 kg/m2  LMP 05/12/2013  Review of Systems: See HPI above.    Objective:  Physical Exam:  Gen: NAD  Right thumb: Nodule felt right 1st A1 pulley area.  No other swelling, bruising, deformity. Reproduction of catching when flexing/extending at IP joint. TTP at nodule.  No other tenderness. FROM IP , MCP, CMC joints. NVI distally.    Assessment & Plan:  1. Right trigger thumb - discussed options.  Has tried bracing, icing, otc meds.  Went ahead with injection today.  F/u prn.  After informed written consent patient was seated on exam  table.  Area overlying right 1st A1 pulley prepped with alcohol swab then injected with 0.5:0.96mL marcaine: depomedrol.  Patient tolerated procedure well without immediate complications.

## 2015-01-16 NOTE — Assessment & Plan Note (Signed)
discussed options.  Has tried bracing, icing, otc meds.  Went ahead with injection today.  F/u prn.  After informed written consent patient was seated on exam table.  Area overlying right 1st A1 pulley prepped with alcohol swab then injected with 0.5:0.57mL marcaine: depomedrol.  Patient tolerated procedure well without immediate complications.

## 2015-02-14 ENCOUNTER — Other Ambulatory Visit: Payer: Self-pay | Admitting: Family

## 2015-02-14 DIAGNOSIS — Z1231 Encounter for screening mammogram for malignant neoplasm of breast: Secondary | ICD-10-CM

## 2015-02-16 ENCOUNTER — Ambulatory Visit (INDEPENDENT_AMBULATORY_CARE_PROVIDER_SITE_OTHER): Payer: Federal, State, Local not specified - PPO

## 2015-02-16 DIAGNOSIS — Z23 Encounter for immunization: Secondary | ICD-10-CM

## 2015-02-26 ENCOUNTER — Ambulatory Visit (HOSPITAL_BASED_OUTPATIENT_CLINIC_OR_DEPARTMENT_OTHER)
Admission: RE | Admit: 2015-02-26 | Discharge: 2015-02-26 | Disposition: A | Discharge status: Home / Self Care | Payer: Federal, State, Local not specified - PPO | Source: Ambulatory Visit | Attending: Family | Admitting: Family

## 2015-02-26 DIAGNOSIS — R928 Other abnormal and inconclusive findings on diagnostic imaging of breast: Secondary | ICD-10-CM | POA: Insufficient documentation

## 2015-02-26 DIAGNOSIS — Z1231 Encounter for screening mammogram for malignant neoplasm of breast: Secondary | ICD-10-CM | POA: Insufficient documentation

## 2015-03-06 ENCOUNTER — Other Ambulatory Visit: Payer: Self-pay | Admitting: Family

## 2015-03-06 DIAGNOSIS — R928 Other abnormal and inconclusive findings on diagnostic imaging of breast: Secondary | ICD-10-CM

## 2015-03-14 ENCOUNTER — Other Ambulatory Visit: Payer: Self-pay

## 2015-03-16 ENCOUNTER — Ambulatory Visit
Admission: RE | Admit: 2015-03-16 | Discharge: 2015-03-16 | Disposition: A | Discharge status: Home / Self Care | Payer: Federal, State, Local not specified - PPO | Source: Ambulatory Visit | Attending: Family | Admitting: Family

## 2015-03-16 DIAGNOSIS — R928 Other abnormal and inconclusive findings on diagnostic imaging of breast: Secondary | ICD-10-CM

## 2015-03-19 ENCOUNTER — Other Ambulatory Visit: Payer: Self-pay | Admitting: Family

## 2015-03-19 NOTE — Telephone Encounter (Signed)
Rx request Denied, patient has not come in to lab for Vitamin D recheck [order was placed at time of Rx 12/26/14]; spoke with patient, understood & agreed, scheduled lab appt Wed, 03/21/15 at 4:00pm/SLS

## 2015-03-21 ENCOUNTER — Other Ambulatory Visit (INDEPENDENT_AMBULATORY_CARE_PROVIDER_SITE_OTHER): Payer: Federal, State, Local not specified - PPO

## 2015-03-21 DIAGNOSIS — E559 Vitamin D deficiency, unspecified: Secondary | ICD-10-CM

## 2015-03-22 ENCOUNTER — Other Ambulatory Visit: Payer: Self-pay | Admitting: Family

## 2015-03-22 ENCOUNTER — Encounter: Payer: Self-pay | Admitting: Family

## 2015-03-22 LAB — VITAMIN D 25 HYDROXY (VIT D DEFICIENCY, FRACTURES): VITD: 56.47 ng/mL (ref 30.00–100.00)

## 2015-03-22 MED ORDER — VITAMIN D3 75 MCG (3000 UT) PO TABS: 1.0000 | ORAL_TABLET | Freq: Every day | ORAL | Status: DC

## 2015-03-28 ENCOUNTER — Other Ambulatory Visit: Payer: Self-pay | Admitting: Women's Health

## 2015-03-28 ENCOUNTER — Encounter: Payer: Self-pay | Admitting: Women's Health

## 2015-03-28 ENCOUNTER — Ambulatory Visit (INDEPENDENT_AMBULATORY_CARE_PROVIDER_SITE_OTHER): Payer: Federal, State, Local not specified - PPO | Admitting: Women's Health

## 2015-03-28 ENCOUNTER — Ambulatory Visit (INDEPENDENT_AMBULATORY_CARE_PROVIDER_SITE_OTHER): Payer: Federal, State, Local not specified - PPO

## 2015-03-28 VITALS — BP 126/80 | Ht 64.0 in | Wt 132.0 lb

## 2015-03-28 DIAGNOSIS — D251 Intramural leiomyoma of uterus: Secondary | ICD-10-CM | POA: Diagnosis not present

## 2015-03-28 DIAGNOSIS — N83319 Acquired atrophy of ovary, unspecified side: Secondary | ICD-10-CM

## 2015-03-28 DIAGNOSIS — N854 Malposition of uterus: Secondary | ICD-10-CM | POA: Diagnosis not present

## 2015-03-28 DIAGNOSIS — N83201 Unspecified ovarian cyst, right side: Secondary | ICD-10-CM

## 2015-03-28 DIAGNOSIS — N839 Noninflammatory disorder of ovary, fallopian tube and broad ligament, unspecified: Secondary | ICD-10-CM | POA: Diagnosis not present

## 2015-03-28 DIAGNOSIS — N838 Other noninflammatory disorders of ovary, fallopian tube and broad ligament: Secondary | ICD-10-CM

## 2015-03-28 NOTE — Patient Instructions (Signed)
Ovarian Cyst An ovarian cyst is a fluid-filled sac that forms on an ovary. The ovaries are small organs that produce eggs in women. Various types of cysts can form on the ovaries. Most are not cancerous. Many do not cause problems, and they often go away on their own. Some may cause symptoms and require treatment. Common types of ovarian cysts include:  Functional cysts--These cysts may occur every month during the menstrual cycle. This is normal. The cysts usually go away with the next menstrual cycle if the woman does not get pregnant. Usually, there are no symptoms with a functional cyst.  Endometrioma cysts--These cysts form from the tissue that lines the uterus. They are also called "chocolate cysts" because they become filled with blood that turns brown. This type of cyst can cause pain in the lower abdomen during intercourse and with your menstrual period.  Cystadenoma cysts--This type develops from the cells on the outside of the ovary. These cysts can get very big and cause lower abdomen pain and pain with intercourse. This type of cyst can twist on itself, cut off its blood supply, and cause severe pain. It can also easily rupture and cause a lot of pain.  Dermoid cysts--This type of cyst is sometimes found in both ovaries. These cysts may contain different kinds of body tissue, such as skin, teeth, hair, or cartilage. They usually do not cause symptoms unless they get very big.  Theca lutein cysts--These cysts occur when too much of a certain hormone (human chorionic gonadotropin) is produced and overstimulates the ovaries to produce an egg. This is most common after procedures used to assist with the conception of a baby (in vitro fertilization). CAUSES   Fertility drugs can cause a condition in which multiple large cysts are formed on the ovaries. This is called ovarian hyperstimulation syndrome.  A condition called polycystic ovary syndrome can cause hormonal imbalances that can lead to  nonfunctional ovarian cysts. SIGNS AND SYMPTOMS  Many ovarian cysts do not cause symptoms. If symptoms are present, they may include:  Pelvic pain or pressure.  Pain in the lower abdomen.  Pain during sexual intercourse.  Increasing girth (swelling) of the abdomen.  Abnormal menstrual periods.  Increasing pain with menstrual periods.  Stopping having menstrual periods without being pregnant. DIAGNOSIS  These cysts are commonly found during a routine or annual pelvic exam. Tests may be ordered to find out more about the cyst. These tests may include:  Ultrasound.  X-ray of the pelvis.  CT scan.  MRI.  Blood tests. TREATMENT  Many ovarian cysts go away on their own without treatment. Your health care provider may want to check your cyst regularly for 2-3 months to see if it changes. For women in menopause, it is particularly important to monitor a cyst closely because of the higher rate of ovarian cancer in menopausal women. When treatment is needed, it may include any of the following:  A procedure to drain the cyst (aspiration). This may be done using a long needle and ultrasound. It can also be done through a laparoscopic procedure. This involves using a thin, lighted tube with a tiny camera on the end (laparoscope) inserted through a small incision.  Surgery to remove the whole cyst. This may be done using laparoscopic surgery or an open surgery involving a larger incision in the lower abdomen.  Hormone treatment or birth control pills. These methods are sometimes used to help dissolve a cyst. HOME CARE INSTRUCTIONS   Only take over-the-counter   or prescription medicines as directed by your health care provider.  Follow up with your health care provider as directed.  Get regular pelvic exams and Pap tests. SEEK MEDICAL CARE IF:   Your periods are late, irregular, or painful, or they stop.  Your pelvic pain or abdominal pain does not go away.  Your abdomen becomes  larger or swollen.  You have pressure on your bladder or trouble emptying your bladder completely.  You have pain during sexual intercourse.  You have feelings of fullness, pressure, or discomfort in your stomach.  You lose weight for no apparent reason.  You feel generally ill.  You become constipated.  You lose your appetite.  You develop acne.  You have an increase in body and facial hair.  You are gaining weight, without changing your exercise and eating habits.  You think you are pregnant. SEEK IMMEDIATE MEDICAL CARE IF:   You have increasing abdominal pain.  You feel sick to your stomach (nauseous), and you throw up (vomit).  You develop a fever that comes on suddenly.  You have abdominal pain during a bowel movement.  Your menstrual periods become heavier than usual. MAKE SURE YOU:  Understand these instructions.  Will watch your condition.  Will get help right away if you are not doing well or get worse.   This information is not intended to replace advice given to you by your health care provider. Make sure you discuss any questions you have with your health care provider.   Document Released: 04/28/2005 Document Revised: 05/03/2013 Document Reviewed: 01/03/2013 Elsevier Interactive Patient Education 2016 [employer]  

## 2015-03-28 NOTE — Progress Notes (Signed)
Patient ID: Joanna Lambert, female   DOB: 06-Jul-1955, 59 y.o.   MRN: [id number] Presents for follow-up ultrasound. Moved here from [place], less than one year ago reported a painful 5 cm right ovarian cyst,  with recheck cyst was 1 cm and asymptomatic. Was instructed from Dr. in [place] to have follow-up ultrasound November 2016. Postmenopausal on no HRT with no bleeding. Continues to be asymptomatic.  Exam: Appears well. Ultrasound: T/V and T/A retroverted uterus with intramural fibroids 10 x 9 mm, 14 x 9 mm, 7 x 6 mm. Right ovary thin-walled solid mass 12 x 10 x 17 mm heterogeneous echoes. Negative CFD. Arterial blood flow to right ovary. Left ovary normal atrophic. Arterial blood flow to left ovary, negative cul-de-sac. Endometrium 1.2 mm.  Persistent small solid right ovarian cyst/asymptomatic  Plan: CA-125. Will triage based on results.

## 2015-03-29 ENCOUNTER — Other Ambulatory Visit: Payer: Self-pay | Admitting: Women's Health

## 2015-03-29 DIAGNOSIS — N83201 Unspecified ovarian cyst, right side: Secondary | ICD-10-CM

## 2015-03-29 LAB — CA 125: CA 125: 7 U/mL (ref <35)

## 2015-08-08 ENCOUNTER — Telehealth: Payer: Self-pay | Admitting: *Deleted

## 2015-08-08 DIAGNOSIS — N83209 Unspecified ovarian cyst, unspecified side: Secondary | ICD-10-CM

## 2015-08-08 NOTE — Telephone Encounter (Signed)
Joanna Lambert the six-month recheck was with the CA 125 lab result. Yes recheck  Ultrasound, has had a persistent solid ovarian cyst.

## 2015-08-08 NOTE — Telephone Encounter (Signed)
Nancy please see the below note. 

## 2015-08-08 NOTE — Telephone Encounter (Signed)
Order placed

## 2015-08-08 NOTE — Telephone Encounter (Signed)
-----   Message from Sinclair Grooms sent at 08/08/2015  2:46 PM EDT ----- Regarding: nurse call Patient states Joanna Lambert wanted to repeat ultrasound in 6 months from November 2016 but I do not see where she mentions that in her not and no order was place. Please ask Joanna Lambert if she wants pt to have ultrasound and when? Thx

## 2015-08-15 DIAGNOSIS — K080 Exfoliation of teeth due to systemic causes: Secondary | ICD-10-CM | POA: Diagnosis not present

## 2015-08-29 DIAGNOSIS — K080 Exfoliation of teeth due to systemic causes: Secondary | ICD-10-CM | POA: Diagnosis not present

## 2015-09-05 ENCOUNTER — Ambulatory Visit (INDEPENDENT_AMBULATORY_CARE_PROVIDER_SITE_OTHER): Payer: Federal, State, Local not specified - PPO | Admitting: Women's Health

## 2015-09-05 ENCOUNTER — Ambulatory Visit (INDEPENDENT_AMBULATORY_CARE_PROVIDER_SITE_OTHER): Payer: Federal, State, Local not specified - PPO

## 2015-09-05 ENCOUNTER — Encounter: Payer: Self-pay | Admitting: Women's Health

## 2015-09-05 ENCOUNTER — Other Ambulatory Visit: Payer: Self-pay | Admitting: Women's Health

## 2015-09-05 DIAGNOSIS — N838 Other noninflammatory disorders of ovary, fallopian tube and broad ligament: Secondary | ICD-10-CM

## 2015-09-05 DIAGNOSIS — N839 Noninflammatory disorder of ovary, fallopian tube and broad ligament, unspecified: Secondary | ICD-10-CM

## 2015-09-05 DIAGNOSIS — N83209 Unspecified ovarian cyst, unspecified side: Secondary | ICD-10-CM

## 2015-09-05 DIAGNOSIS — D251 Intramural leiomyoma of uterus: Secondary | ICD-10-CM

## 2015-09-05 DIAGNOSIS — N83201 Unspecified ovarian cyst, right side: Secondary | ICD-10-CM | POA: Diagnosis not present

## 2015-09-05 NOTE — Progress Notes (Signed)
Patient ID: Joanna Lambert, female   DOB: [birth year], 60 y.o.   MRN: [id number] Presents for ultrasound. History of right-sided pelvic pain and persistent right ovarian cyst. History of appendectomy. History of a 5 cm right ovarian cyst that has resolved. 03/2015 ovarian cyst 12 x 10 x 17 mm, CA-125  7. Postmenopausal on no HRT.  Ultrasound: T/V and T/A retroverted uterus intramural fibroid 12 x 17 mm. Right ovary continued presence of solid mass avascular 13 x 8 x 8 mm, slightly smaller in size than previous ultrasound. Avascular. Positive arterial blood flow to right ovary. Left ovary normal. Negative cul-de-sac.  Persistent small right ovarian solid mass/smaller  Plan: Repeat CA 125. Reviewed most likely not cause of right-sided discomfort. Will try Motrin. Return to office for annual exam.

## 2015-09-06 LAB — CA 125: CA 125: 8 U/mL (ref <35)

## 2015-09-12 DIAGNOSIS — Z83511 Family history of glaucoma: Secondary | ICD-10-CM | POA: Diagnosis not present

## 2015-09-12 DIAGNOSIS — H5203 Hypermetropia, bilateral: Secondary | ICD-10-CM | POA: Diagnosis not present

## 2015-09-12 DIAGNOSIS — H2513 Age-related nuclear cataract, bilateral: Secondary | ICD-10-CM | POA: Diagnosis not present

## 2015-09-12 DIAGNOSIS — H1851 Endothelial corneal dystrophy: Secondary | ICD-10-CM | POA: Diagnosis not present

## 2015-09-26 ENCOUNTER — Encounter: Payer: Self-pay | Admitting: Family Medicine

## 2015-09-26 ENCOUNTER — Ambulatory Visit (INDEPENDENT_AMBULATORY_CARE_PROVIDER_SITE_OTHER): Payer: Federal, State, Local not specified - PPO | Admitting: Family Medicine

## 2015-09-26 VITALS — BP 132/82 | HR 85 | Ht 64.0 in | Wt 133.0 lb

## 2015-09-26 DIAGNOSIS — M25561 Pain in right knee: Secondary | ICD-10-CM | POA: Diagnosis not present

## 2015-09-26 NOTE — Patient Instructions (Signed)
You have popliteus spasms and secondary hamstring muscle strain. Aleve 2 tabs twice a day with food OR ibuprofen 600mg  three times a day with food only if needed. Heat 15 minutes at a time 3-4 times a day. Leg curls, hamstring swings, running lunges - add 2 pound weight with time if these are too easy. 3 sets of 10 once or twice a day. Start physical therapy and transition to a home exercise program.

## 2015-09-27 DIAGNOSIS — M25561 Pain in right knee: Secondary | ICD-10-CM | POA: Insufficient documentation

## 2015-09-27 NOTE — Assessment & Plan Note (Signed)
2/2 popliteus muscle spasms/strain, secondary hamstring strain.  She will start physical therapy and home exercises.  Heat, nsaids as needed.  Shown home exercises to do daily while waiting on PT.

## 2015-09-27 NOTE — Progress Notes (Signed)
PCP: Nance Pear., NP  Subjective:   HPI: Patient is a 60 y.o. female here for right knee pain.  Patient reports she's had 3 months of right knee pain. Primarily posterior pain. Worse with trying to fully extend her right knee. Pain is 3/10 at rest, up to 6/10 and sharp when trying to extend the knee. Can come and go. No associated swelling, redness, bruising. No trauma. No numbness.  Past Medical History  Diagnosis Date  . Allergy     seasonal  . Migraines   . Ovarian cyst     right  . Restless legs   . Varicose veins   . Vitamin D deficiency 12/26/2014    Current Outpatient Prescriptions on File Prior to Visit  Medication Sig Dispense Refill  . aspirin 81 MG tablet Take 81 mg by mouth daily.    . Cholecalciferol (VITAMIN D3) 3000 UNITS TABS Take 1 tablet by mouth daily. 30 tablet   . estradiol (ESTRING) 2 MG vaginal ring Place 2 mg vaginally every 3 (three) months. follow package directions 1 each 4  . rizatriptan (MAXALT) 10 MG tablet Take 1 tablet (10 mg total) by mouth as needed for migraine. May repeat in 2 hours if needed 10 tablet 5   No current facility-administered medications on file prior to visit.    Past Surgical History  Procedure Laterality Date  . Foot surgery Right 2011    bunion removal  . Appendectomy    . Breast surgery Right 2012    biopsy, benign cyst  . Carpal tunnel release Bilateral 2010    Allergies  Allergen Reactions  . Adhesive [Tape] Rash    Social History   Social History  . Marital Status: Married    Spouse Name: N/A  . Number of Children: N/A  . Years of Education: N/A   Occupational History  . Not on file.   Social History Main Topics  . Smoking status: Never Smoker   . Smokeless tobacco: Never Used  . Alcohol Use: 1.2 oz/week    2 Glasses of wine per week  . Drug Use: No  . Sexual Activity: Not on file   Other Topics Concern  . 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      Family History  Problem Relation Age of Onset  . Diabetes Mother   . Hypertension Mother 40  . Hyperlipidemia Mother   . Heart disease Father 39    pacemaker  . Atrial fibrillation Father 75    BP 132/82 mmHg  Pulse 85  Ht 5\' 4"  (1.626 m)  Wt 133 lb (60.328 kg)  BMI 22.82 kg/m2  LMP 05/12/2013  Review of Systems: See HPI above.    Objective:  Physical Exam:  Gen: NAD, comfortable in exam room  Right knee: No gross deformity, ecchymoses, effusion.  No palpable cords, bakers cyst. TTP popliteal fossa, distal hamstring tendons. FROM with pain on full extension. Negative ant/post drawers. Negative valgus/varus testing. Negative lachmanns. Negative mcmurrays, apleys, patellar apprehension. NV intact distally.  Left knee: FROM without pain.    MSK u/s right knee:  No visible meniscal tears.  Popliteal vein easily compressible in area of maximal pain popliteal fossa.  No bakers cyst.  Hamstring tendons also normal  Assessment &  Plan:  1. Right knee pain - 2/2 popliteus muscle spasms/strain, secondary hamstring strain.  She will start physical therapy and home exercises.  Heat, nsaids as needed.  Shown home exercises to do daily while waiting on PT.

## 2015-10-16 ENCOUNTER — Ambulatory Visit: Payer: Federal, State, Local not specified - PPO | Attending: Family Medicine | Admitting: Physical Therapy

## 2015-10-16 DIAGNOSIS — M25561 Pain in right knee: Secondary | ICD-10-CM | POA: Insufficient documentation

## 2015-10-16 DIAGNOSIS — M6281 Muscle weakness (generalized): Secondary | ICD-10-CM

## 2015-10-16 NOTE — Patient Instructions (Signed)
   Straight Leg Raise: With External Leg Rotation    Lie on back with right leg straight, opposite leg bent. Rotate straight leg out and lift __6-8__ inches. Repeat __10__ times per set. Do __2__ sets per session. Do _2-3___ sessions per day.  http://orth.exer.us/729   Copyright  VHI. All rights reserved.     HIP: Abduction - Side-Lying    Lie on side, legs straight and in line with trunk. Squeeze glutes. Raise top leg up and slightly back. Point toes forward. _10__ reps per set, _2__ sets per session, _2-3__ times per day.   Bend bottom leg to stabilize pelvis.  Use 2 lbs weights.  Copyright  VHI. All rights reserved.

## 2015-10-16 NOTE — Therapy (Addendum)
[facility] [address]  [place], [place], [address] Phone: [phone]   Fax:  [phone]  Physical Therapy Evaluation  Patient Details  Name: Joanna Lambert MRN: [id number] Date of Birth: 1955-11-27 Referring Provider: Karlton Lemon, MD  Encounter Date: 10/16/2015      PT End of Session - 10/16/15 1602    Visit Number 1   Number of Visits 4   Date for PT Re-Evaluation 11/27/15   Authorization Type BC[employer] Federal   PT Start Time 1520   PT Stop Time 1558   PT Time Calculation (min) 38 min   Activity Tolerance Patient tolerated treatment well   Behavior During Therapy [facility] for tasks assessed/performed      Past Medical History  Diagnosis Date  . Allergy     seasonal  . Migraines   . Ovarian cyst     right  . Restless legs   . Varicose veins   . Vitamin D deficiency 12/26/2014    Past Surgical History  Procedure Laterality Date  . Foot surgery Right 2011    bunion removal  . Appendectomy    . Breast surgery Right 2012    biopsy, benign cyst  . Carpal tunnel release Bilateral 2010    There were no vitals filed for this visit.       Subjective Assessment - 10/16/15 1521    Subjective Pt is a 60 y/o female who presents to OPPT with 6 month history of R knee pain.  Reports hx of hyperextension injury in Dec 2016 from stepping awkwardly.  Pt reports increased pain with sitting and getting up and down from floor when caring for grandchildren.  Reports more pain with lateral movement.  Pt also c/o pain at night with full extension when lying down in bed.     Pertinent History migraines   Limitations Sitting   How long can you sit comfortably? reports she "toughs it out" but feels the need to reposition after 1 hour   Patient Stated Goals improve strength, decrease pain   Currently in Pain? Yes   Pain Score 0-No pain  up to 5/10 in past week   Pain Location Knee   Pain Orientation Right   Pain  Descriptors / Indicators Other (Comment);Stabbing  pulling, rubbing   Pain Type Chronic pain   Pain Onset More than a month ago   Pain Frequency Intermittent   Aggravating Factors  sitting, squatting   Pain Relieving Factors exercises, somewhat unknown            [facility] PT Assessment - 10/16/15 1526    Assessment   Medical Diagnosis R knee pain   Referring Provider Karlton Lemon, MD   Onset Date/Surgical Date --  6 months ago   Next MD Visit PRN   Prior Therapy L elbow   Precautions   Precautions None   Restrictions   Weight Bearing Restrictions No   Balance Screen   Has the patient fallen in the past 6 months No   Has the patient had a decrease in activity level because of a fear of falling?  No   Is the patient reluctant to leave their home because of a fear of falling?  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   Leisure walking, play board games, travel   Observation/Other Assessments   Focus on Therapeutic Outcomes (FOTO)  57 (43% limited; predicted 32% limited)   Functional Tests   Functional tests Other   Other:   Other/ Comments 10 degree extensor lag with RLE SLR with hip external rotation   AROM   AROM Assessment Site Knee   Right/Left Knee Right;Left   Right Knee Extension 0   Right Knee Flexion 135   Left Knee Extension 0   Left Knee Flexion 143   PROM   Overall PROM Comments mild pain with overpressure on R knee flexion   Strength   Strength Assessment Site Hip;Knee   Right Hip Flexion 4/5   Right Hip Extension 3+/5   Right Hip ABduction 4-/5   Right Hip ADduction 4-/5   Left Hip Flexion 4/5   Left Hip Extension 4/5   Left Hip ABduction 4/5   Left Hip  ADduction 4/5   Right/Left Knee Right;Left   Right Knee Flexion 4-/5  with pain   Right Knee Extension 4/5   Left Knee Flexion 5/5   Left Knee Extension 5/5   Palpation   Patella mobility no significant tracking abnormalities noted   Palpation comment mild tenderness along R popliteal fossa   Special Tests    Special Tests Knee Special Tests   Knee Special tests  other   other    Findings Negative   Side  Right;Left   Comments valgus stress test   Ambulation/Gait   Gait Comments pt amb independently without gait deviations today                   [facility] Adult PT Treatment/Exercise - 10/16/15 1526    Exercises   Exercises Knee/Hip   Knee/Hip Exercises: Standing   Other Standing Knee Exercises recommended continues hamstring curls given to pt by MD   Knee/Hip Exercises: Supine   Straight Leg Raise with External Rotation Right;10 reps   Straight Leg Raise with External Rotation Limitations cues for technique and how to maintain knee extension without extensor lag   Knee/Hip Exercises: Sidelying   Hip ABduction Right;10 reps   Hip ABduction Limitations 2# with cues for technique                PT Education - 10/16/15 1602    Education provided Yes   Education Details clinical findings, POC, goals of care, continue HEP and updated HEP   Person(s) Educated Patient   Methods Explanation   Comprehension Verbalized understanding             PT Long Term Goals - 10/16/15 1607    PT LONG TERM GOAL #1   Title independent with HEP (11/27/15)   Time 6   Period Weeks   Status New   PT LONG TERM GOAL #2   Title perform R knee ROM without pain (11/27/15)   Time 6   Period Weeks   Status New   PT LONG TERM GOAL #3   Title perform 10 reps RLE SLR with external rotation without extensor lag for improved VMO strength and knee stability (11/27/15)   Time 6   Period Weeks   Status New   PT LONG TERM GOAL #4   Title perform full squat without pain for improved  function (11/27/15)   Time 6   Period Weeks  Status New               Plan - 10/16/15 1603    Clinical Impression Statement Pt is a 60 y/o female who presents to OPPT for low complexity eval of R knee pain.  No pain present today with ability to reproduce pain with end range knee flexion and resisted MMT of knee flexion.  Pt demonstrates poor VMO activation and weakness in hips and knees affecting ability to care for grandchildren and perform functional tasks without pain consistently.  Pt reports pain is intermittent and at this time hasn't been bothersome; she feels exercises have helped.  Added to HEP to address glut med weakness and VMO strengthening.  Will plan to follow up as needed.   Rehab Potential Good   PT Frequency Biweekly   PT Duration 6 weeks   PT Treatment/Interventions ADLs/Self Care Home Management;Electrical Stimulation;Moist Heat;Ultrasound;Patient/family education;Therapeutic exercise;Therapeutic activities;Functional mobility training;Manual techniques;Taping   PT Next Visit Plan review HEP; progress hip stability exercises and VMO strengthening (closed chain activities); modalities PRN   Consulted and Agree with Plan of Care Patient      Patient will benefit from skilled therapeutic intervention in order to improve the following deficits and impairments:  Decreased strength, Pain, Decreased mobility  Visit Diagnosis: Pain in right knee - Plan: PT plan of care cert/re-cert  Muscle weakness (generalized) - Plan: PT plan of care cert/re-cert     Problem List Patient Active Problem List   Diagnosis Date Noted  . Right knee pain 09/27/2015  . Trigger finger, acquired 01/16/2015  . Preventative health care 12/26/2014  . Vitamin D deficiency 12/26/2014  . Chronic venous insufficiency 09/05/2014  . Migraines 09/05/2014  . Ovarian cyst 09/05/2014  . Irritable bowel syndrome with constipation 10/13/2011  . Acid reflux 02/12/2011  . Phlebectasia 02/12/2011   . Allergic rhinitis, seasonal 02/12/2011   Laureen Abrahams, PT, DPT 10/16/2015 4:13 PM  [facility] [address]  [place], [place], [address] Phone: [phone]   Fax:  [phone]  Name: Joanna Lambert MRN: [id number] Date of Birth: May 23, 1955        PHYSICAL THERAPY DISCHARGE SUMMARY  Visits from Start of Care: 1  Current functional level related to goals / functional outcomes: See above   Remaining deficits: unknown   Education / Equipment: HEP  Plan: Patient agrees to discharge.  Patient goals were not met. Patient is being discharged due to not returning since the last visit.  ?????     Laureen Abrahams, PT, DPT 11/21/2015 8:45 AM  [facility] [address], [place] [address]  [phone] (office) [phone] (fax)

## 2015-11-16 ENCOUNTER — Other Ambulatory Visit: Payer: Self-pay | Admitting: Family

## 2015-11-16 NOTE — Telephone Encounter (Signed)
Called pt. Informed of the below. She says that she will call back to schedule.

## 2015-11-16 NOTE — Telephone Encounter (Signed)
Pt is requesting refill on Maxalt tabs.   Last OV: 12/26/2014, no future appt scheduled Last Fill: 09/05/2014 #10 and 5RF  Please advise.

## 2015-11-16 NOTE — Telephone Encounter (Signed)
Refill sent. Please contact pt to schedule follow up visit.

## 2015-12-24 ENCOUNTER — Other Ambulatory Visit: Payer: Self-pay | Admitting: Women's Health

## 2015-12-24 DIAGNOSIS — N898 Other specified noninflammatory disorders of vagina: Secondary | ICD-10-CM

## 2015-12-26 ENCOUNTER — Ambulatory Visit (INDEPENDENT_AMBULATORY_CARE_PROVIDER_SITE_OTHER): Payer: Federal, State, Local not specified - PPO | Admitting: Family

## 2015-12-26 ENCOUNTER — Encounter: Payer: Self-pay | Admitting: Family

## 2015-12-26 VITALS — BP 120/80 | HR 74 | Temp 97.9°F | Resp 16 | Ht 64.0 in | Wt 133.0 lb

## 2015-12-26 DIAGNOSIS — Z0000 Encounter for general adult medical examination without abnormal findings: Secondary | ICD-10-CM

## 2015-12-26 DIAGNOSIS — Z86010 Personal history of colonic polyps: Secondary | ICD-10-CM | POA: Diagnosis not present

## 2015-12-26 DIAGNOSIS — E559 Vitamin D deficiency, unspecified: Secondary | ICD-10-CM

## 2015-12-26 LAB — CBC WITH DIFFERENTIAL/PLATELET
Basophils Absolute: 0 10*3/uL (ref 0.0–0.1)
Basophils Relative: 0.9 % (ref 0.0–3.0)
Eosinophils Absolute: 0.2 10*3/uL (ref 0.0–0.7)
Eosinophils Relative: 3.4 % (ref 0.0–5.0)
HCT: 40.5 % (ref 36.0–46.0)
Hemoglobin: 13.6 g/dL (ref 12.0–15.0)
Lymphocytes Relative: 30.4 % (ref 12.0–46.0)
Lymphs Abs: 1.5 10*3/uL (ref 0.7–4.0)
MCHC: 33.6 g/dL (ref 30.0–36.0)
MCV: 89.8 fl (ref 78.0–100.0)
Monocytes Absolute: 0.6 10*3/uL (ref 0.1–1.0)
Monocytes Relative: 11.4 % (ref 3.0–12.0)
Neutro Abs: 2.7 10*3/uL (ref 1.4–7.7)
Neutrophils Relative %: 53.9 % (ref 43.0–77.0)
Platelets: 255 10*3/uL (ref 150.0–400.0)
RBC: 4.51 Mil/uL (ref 3.87–5.11)
RDW: 12.3 % (ref 11.5–15.5)
WBC: 5 10*3/uL (ref 4.0–10.5)

## 2015-12-26 LAB — LIPID PANEL
Cholesterol: 189 mg/dL (ref 0–200)
HDL: 51.9 mg/dL (ref >39.00)
LDL Cholesterol: 126 mg/dL — ABNORMAL HIGH (ref 0–99)
NonHDL: 137.34
Total CHOL/HDL Ratio: 4
Triglycerides: 58 mg/dL (ref 0.0–149.0)
VLDL: 11.6 mg/dL (ref 0.0–40.0)

## 2015-12-26 LAB — BASIC METABOLIC PANEL
BUN: 13 mg/dL (ref 6–23)
CO2: 33 mEq/L — ABNORMAL HIGH (ref 19–32)
Calcium: 9.8 mg/dL (ref 8.4–10.5)
Chloride: 99 mEq/L (ref 96–112)
Creatinine, Ser: 0.79 mg/dL (ref 0.40–1.20)
GFR: 79.38 mL/min (ref >60.00)
Glucose, Bld: 84 mg/dL (ref 70–99)
Potassium: 3.9 mEq/L (ref 3.5–5.1)
Sodium: 138 mEq/L (ref 135–145)

## 2015-12-26 LAB — HEPATIC FUNCTION PANEL
ALT: 20 U/L (ref 0–35)
AST: 21 U/L (ref 0–37)
Albumin: 4.5 g/dL (ref 3.5–5.2)
Alkaline Phosphatase: 83 U/L (ref 39–117)
Bilirubin, Direct: 0.1 mg/dL (ref 0.0–0.3)
Total Bilirubin: 0.5 mg/dL (ref 0.2–1.2)
Total Protein: 7.3 g/dL (ref 6.0–8.3)

## 2015-12-26 LAB — TSH: TSH: 1.29 u[IU]/mL (ref 0.35–4.50)

## 2015-12-26 LAB — VITAMIN D 25 HYDROXY (VIT D DEFICIENCY, FRACTURES): VITD: 20.3 ng/mL — ABNORMAL LOW (ref 30.00–100.00)

## 2015-12-26 NOTE — Progress Notes (Signed)
Subjective:    Patient ID: Joanna Lambert, female    DOB: 06/29/1955, 60 y.o.   MRN: [id number]  HPI  Patient presents today for complete physical.  Immunizations: up to date Diet: healthy Exercise: walks 6 days a week Colonoscopy: 05/2010, due Dexa: 12/26/13 Pap Smear: 10/11/14 done by GYN Elon Alas NP) Mammogram: 03/16/15   Review of Systems  Constitutional: Negative for unexpected weight change.  HENT: Positive for rhinorrhea.        Mild runny nose, grandson has a runny nose  Respiratory: Negative for cough and shortness of breath.   Cardiovascular: Negative for chest pain.  Gastrointestinal: Negative for constipation and diarrhea.  Genitourinary: Negative for dysuria and frequency.  Musculoskeletal: Negative for arthralgias and myalgias.  Skin: Negative for rash.  Neurological:       HA's are worse at the end of her 90 day estring cycle. Uses Estrace with some help  Hematological: Negative for adenopathy.  Psychiatric/Behavioral:       Denies depression/anxiety   Past Medical History:  Diagnosis Date  . Allergy    seasonal  . Migraines   . Ovarian cyst    right  . Restless legs   . Varicose veins   . Vitamin D deficiency 12/26/2014     Social History   Social History  . Marital status: Married    Spouse name: N/A  . Number of children: N/A  . Years of education: N/A   Occupational History  . Not on file.   Social History Main Topics  . Smoking status: Never Smoker  . Smokeless tobacco: Never Used  . Alcohol use 1.2 oz/week    2 Glasses of wine per week  . Drug use: No  . Sexual activity: Not on file   Other Topics Concern  . 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      Past Surgical History:  Procedure Laterality Date  . APPENDECTOMY    . BREAST SURGERY Right  2012   biopsy, benign cyst  . CARPAL TUNNEL RELEASE Bilateral 2010  . FOOT SURGERY Right 2011   bunion removal    Family History  Problem Relation Age of Onset  . Diabetes Mother   . Hypertension Mother 51  . Hyperlipidemia Mother   . Heart disease Father 73    pacemaker  . Atrial fibrillation Father 73  . Peripheral vascular disease Father     s/p Carotid Edartarectomy    Allergies  Allergen Reactions  . Adhesive [Tape] Rash    Current Outpatient Prescriptions on File Prior to Visit  Medication Sig Dispense Refill  . aspirin 81 MG tablet Take 81 mg by mouth daily.    . calcium carbonate (OS-CAL - DOSED IN MG OF ELEMENTAL CALCIUM) 1250 (500 Ca) MG tablet Take by mouth.    . ESTRING 2 MG vaginal ring INSERT VAGINALLY AS DIRECTED FOR 3 MONTHS 1 each 0  . rizatriptan (MAXALT) 10 MG tablet TAKE 1 BY MOUTH AS NEEDED FOR MIGRAINE. MAY REPEAT IN 2 HOURS IF NEEDED 10 tablet 0  . Vitamin D, Ergocalciferol, (DRISDOL) 50000 units CAPS capsule Take 1 capsule (50,000 Units total) by mouth every 7 (seven) days. 12 capsule 0   No current facility-administered medications on file prior to visit.  BP 120/80 (BP Location: Left Arm, Patient Position: Sitting, Cuff Size: Normal)   Pulse 74   Temp 97.9 F (36.6 C) (Oral)   Resp 16   Ht 5\' 4"  (1.626 m)   Wt 133 lb (60.3 kg)   LMP 05/12/2013   SpO2 97%   BMI 22.83 kg/m        Objective:   Physical Exam  Physical Exam  Constitutional: She is oriented to person, place, and time. She appears well-developed and well-nourished. No distress.  HENT:  Head: Normocephalic and atraumatic.  Right Ear: Tympanic membrane and ear canal normal.  Left Ear: Tympanic membrane and ear canal normal.  Mouth/Throat: Oropharynx is clear and moist.  Eyes: Pupils are equal, round, and reactive to light. No scleral icterus.  Neck: Normal range of motion. No thyromegaly present.  Cardiovascular: Normal rate and regular rhythm.   No murmur  heard. Pulmonary/Chest: Effort normal and breath sounds normal. No respiratory distress. He has no wheezes. She has no rales. She exhibits no tenderness.  Abdominal: Soft. Bowel sounds are normal. She exhibits no distension and no mass. There is no tenderness. There is no rebound and no guarding.  Musculoskeletal: She exhibits no edema.  Lymphadenopathy:    She has no cervical adenopathy.  Neurological: She is alert and oriented to person, place, and time. She has normal patellar reflexes. She exhibits normal muscle tone. Coordination normal.  Skin: Skin is warm and dry.  Psychiatric: She has a normal mood and affect. Her behavior is normal. Judgment and thought content normal.      Assessment & Plan:          Assessment & Plan:   EKG tracing is personally reviewed.  EKG notes NSR.  No acute changes.

## 2015-12-26 NOTE — Patient Instructions (Signed)
Please complete lab work prior to leaving.   

## 2015-12-26 NOTE — Progress Notes (Signed)
Pre visit review using our clinic review tool, if applicable. No additional management support is needed unless otherwise documented below in the visit note. 

## 2015-12-27 LAB — URINALYSIS, ROUTINE W REFLEX MICROSCOPIC
Bilirubin Urine: NEGATIVE
Hgb urine dipstick: NEGATIVE
Ketones, ur: NEGATIVE
Leukocytes, UA: NEGATIVE
Nitrite: NEGATIVE
RBC / HPF: NONE SEEN (ref 0–?)
Specific Gravity, Urine: <=1.005 — AB (ref 1.000–1.030)
Total Protein, Urine: NEGATIVE
Urine Glucose: NEGATIVE
Urobilinogen, UA: 0.2 (ref 0.0–1.0)
WBC, UA: NONE SEEN (ref 0–?)
pH: 5.5 (ref 5.0–8.0)

## 2015-12-27 NOTE — Telephone Encounter (Signed)
Vitamin D level is low.  Advise patient to begin vit D 50000 units once weekly for 12 weeks, then repeat vit D level (dx Vit D deficiency).  Hold the otc supplement for now.

## 2015-12-28 MED ORDER — VITAMIN D (ERGOCALCIFEROL) 1.25 MG (50000 UNIT) PO CAPS: 50000.0000 [IU] | ORAL_CAPSULE | ORAL | 0 refills | Status: DC

## 2015-12-30 NOTE — Assessment & Plan Note (Signed)
Immunizations reviewed and up to date. Obtain routine lab work.  Refer for colo, pap/mammo up to date.  Continue healthy diet and exercise.

## 2016-01-08 ENCOUNTER — Other Ambulatory Visit: Payer: Self-pay | Admitting: Family

## 2016-01-16 ENCOUNTER — Ambulatory Visit (INDEPENDENT_AMBULATORY_CARE_PROVIDER_SITE_OTHER): Payer: Federal, State, Local not specified - PPO | Admitting: Women's Health

## 2016-01-16 ENCOUNTER — Encounter: Payer: Self-pay | Admitting: Women's Health

## 2016-01-16 VITALS — BP 130/82 | Ht 64.0 in | Wt 133.0 lb

## 2016-01-16 DIAGNOSIS — N898 Other specified noninflammatory disorders of vagina: Secondary | ICD-10-CM | POA: Diagnosis not present

## 2016-01-16 DIAGNOSIS — Z01419 Encounter for gynecological examination (general) (routine) without abnormal findings: Secondary | ICD-10-CM

## 2016-01-16 MED ORDER — ESTRADIOL 2 MG VA RING: VAGINAL_RING | VAGINAL | 4 refills | Status: DC

## 2016-01-16 NOTE — Patient Instructions (Signed)

## 2016-01-16 NOTE — Progress Notes (Signed)
Joanna Lambert 1955/07/21 [id number]    History:    Presents for annual exam.  Postmenopausal on Estring with good relief of vaginal dryness and preventing migraines. States by day 80 migraines are starting would like to try to change Estring every 80 days. History of a 5 cm ovarian cyst that resolved. Normal Pap and mammogram history. 2015 normal DEXA. History of IBS had a colonoscopy 2009  numerous negative polyps, 2012 negative colonoscopy is due for colonoscopy now.  Past medical history, past surgical history, family history and social history were all reviewed and documented in the EPIC chart. Originally from [place]. Son doing a PhD program at [employer], daughter is a PA helps to care for her 2 grandchildren. Mother diabetes, hypertension and elevated cholesterol, father heart disease both living.  ROS:  A ROS was performed and pertinent positives and negatives are included.  Exam:  Vitals:   01/16/16 1608  BP: 130/82  Weight: 133 lb (60.3 kg)  Height: 5\' 4"  (1.626 m)   Body mass index is 22.83 kg/m.   General appearance:  Normal Thyroid:  Symmetrical, normal in size, without palpable masses or nodularity. Respiratory  Auscultation:  Clear without wheezing or rhonchi Cardiovascular  Auscultation:  Regular rate, without rubs, murmurs or gallops  Edema/varicosities:  Not grossly evident Abdominal  Soft,nontender, without masses, guarding or rebound.  Liver/spleen:  No organomegaly noted  Hernia:  None appreciated  Skin  Inspection:  Grossly normal   Breasts: Examined lying and sitting.     Right: Without masses, retractions, discharge or axillary adenopathy.     Left: Without masses, retractions, discharge or axillary adenopathy. Gentitourinary   Inguinal/mons:  Normal without inguinal adenopathy  External genitalia:  Normal  BUS/Urethra/Skene's glands:  Normal  Vagina:  Normal  Cervix:  Normal  Uterus:   normal in size, shape and contour.  Midline and  mobile  Adnexa/parametria:     Rt: Without masses or tenderness.   Lt: Without masses or tenderness.  Anus and perineum: Normal  Digital rectal exam: Normal sphincter tone without palpated masses or tenderness  Assessment/Plan:  60 y.o. MWF G3 P2 for annual exam.    Postmenopausal with no bleeding on Estring IBS Labs-primary care Migraines  Plan: Estring 2 mg per vagina every 80 days prescription, proper use given. Instructed to call if continued headache problems. SBE's, continue annual 3-D screening mammogram history of dense breasts. Continue active lifestyle of regular exercise, calcium rich foods, vitamin D 50,000 weekly per primary care. Home safety, fall prevention and importance of weightbearing exercise reviewed. Has occasional intermittent right lower quadrant pain, instructed to call for ultrasound if continues or changes. Pap normal 2016, new screening guidelines reviewed.    Huel Cote [facility], 5:31 PM 01/16/2016

## 2016-01-30 DIAGNOSIS — K080 Exfoliation of teeth due to systemic causes: Secondary | ICD-10-CM | POA: Diagnosis not present

## 2016-02-01 ENCOUNTER — Telehealth: Payer: Self-pay | Admitting: Gastroenterology

## 2016-02-27 ENCOUNTER — Other Ambulatory Visit: Payer: Self-pay | Admitting: Family

## 2016-02-28 NOTE — Telephone Encounter (Signed)
Medication filled to pharmacy as requested.   

## 2016-02-29 ENCOUNTER — Other Ambulatory Visit: Payer: Self-pay | Admitting: Family

## 2016-02-29 DIAGNOSIS — Z1231 Encounter for screening mammogram for malignant neoplasm of breast: Secondary | ICD-10-CM

## 2016-03-18 ENCOUNTER — Ambulatory Visit (HOSPITAL_BASED_OUTPATIENT_CLINIC_OR_DEPARTMENT_OTHER)
Admission: RE | Admit: 2016-03-18 | Discharge: 2016-03-18 | Disposition: A | Discharge status: Home / Self Care | Payer: Federal, State, Local not specified - PPO | Source: Ambulatory Visit | Attending: Family | Admitting: Family

## 2016-03-18 DIAGNOSIS — Z1231 Encounter for screening mammogram for malignant neoplasm of breast: Secondary | ICD-10-CM | POA: Diagnosis not present

## 2016-03-20 ENCOUNTER — Other Ambulatory Visit: Payer: Self-pay | Admitting: Family

## 2016-03-26 DIAGNOSIS — L821 Other seborrheic keratosis: Secondary | ICD-10-CM | POA: Diagnosis not present

## 2016-03-26 DIAGNOSIS — L718 Other rosacea: Secondary | ICD-10-CM | POA: Diagnosis not present

## 2016-03-26 NOTE — Telephone Encounter (Signed)
Dr. Silverio Decamp reviewed records and and has accepted for patient to be scheduled for direct colonoscopy. Spoke with patient will call back to schedule. Records will be in records file folder.

## 2016-04-17 ENCOUNTER — Encounter: Payer: Self-pay | Admitting: Gastroenterology

## 2016-04-17 NOTE — Telephone Encounter (Signed)
Patient has not returned phone call to schedule. Records will be in "records reviewed" folder. °

## 2016-04-28 ENCOUNTER — Other Ambulatory Visit: Payer: Self-pay | Admitting: Family

## 2016-04-28 NOTE — Telephone Encounter (Signed)
Refill sent per [facility] refill protocol/SLS 12/18 Last OV: 12/26/15 ROV: 6-Mths. Last [3] BP: 130/82, 120/80, 132/80

## 2016-05-01 ENCOUNTER — Emergency Department (INDEPENDENT_AMBULATORY_CARE_PROVIDER_SITE_OTHER)
Admission: EM | Admit: 2016-05-01 | Discharge: 2016-05-01 | Disposition: A | Discharge status: Home / Self Care | Payer: Federal, State, Local not specified - PPO | Source: Home / Self Care | Attending: Family Medicine | Admitting: Family Medicine

## 2016-05-01 ENCOUNTER — Encounter: Payer: Self-pay | Admitting: Emergency Medicine

## 2016-05-01 DIAGNOSIS — R358 Other polyuria: Secondary | ICD-10-CM | POA: Diagnosis not present

## 2016-05-01 DIAGNOSIS — R351 Nocturia: Secondary | ICD-10-CM | POA: Diagnosis not present

## 2016-05-01 DIAGNOSIS — R3589 Other polyuria: Secondary | ICD-10-CM

## 2016-05-01 LAB — POCT URINALYSIS DIP (MANUAL ENTRY)
Bilirubin, UA: NEGATIVE
Glucose, UA: NEGATIVE
Ketones, POC UA: NEGATIVE
Leukocytes, UA: NEGATIVE
Nitrite, UA: NEGATIVE
Protein Ur, POC: NEGATIVE
Spec Grav, UA: 1.01 (ref 1.005–1.03)
Urobilinogen, UA: 0.2 (ref 0–1)
pH, UA: 5.5 (ref 5–8)

## 2016-05-01 NOTE — ED Triage Notes (Signed)
Polyuria, pressure, LBP x 4 days

## 2016-05-01 NOTE — ED Provider Notes (Signed)
CSN: [id number]     Arrival date & time 05/01/16  0919 History   First MD Initiated Contact with Patient 05/01/16 1010     Chief Complaint  Patient presents with  . Polyuria   (Consider location/radiation/quality/duration/timing/severity/associated sxs/prior Treatment) HPI  Joanna Lambert is a 60 y.o. female presenting to UC with c/o urinary frequency, lower back pain, and bladder pressure for about 4 days.  She reports urinary frequency is typically worse at night.  She has been trying to stay well hydrated and take OTC cranberry tablets but no relief.  Hx of first UTI about 1 year ago.  Denies fever, chills, n/v/d. Denies vaginal symptoms.    Past Medical History:  Diagnosis Date  . Allergy    seasonal  . Migraines   . Ovarian cyst    right  . Restless legs   . Varicose veins   . Vitamin D deficiency 12/26/2014   Past Surgical History:  Procedure Laterality Date  . APPENDECTOMY    . BREAST SURGERY Right 2012   biopsy, benign cyst  . CARPAL TUNNEL RELEASE Bilateral 2010  . FOOT SURGERY Right 2011   bunion removal   Family History  Problem Relation Age of Onset  . Diabetes Mother   . Hypertension Mother 29  . Hyperlipidemia Mother   . Heart disease Father 36    pacemaker  . Atrial fibrillation Father 37  . Peripheral vascular disease Father     s/p Carotid Edartarectomy   Social History  Substance Use Topics  . Smoking status: Never Smoker  . Smokeless tobacco: Never Used  . Alcohol use 1.2 oz/week    2 Glasses of wine per week   OB History    Gravida Para Term Preterm AB Living   2       0 2   SAB TAB Ectopic Multiple Live Births                 Review of Systems  Gastrointestinal: Positive for abdominal pain ( bladder pressure). Negative for diarrhea, nausea and vomiting.  Genitourinary: Positive for frequency and urgency. Negative for dysuria, flank pain and hematuria.  Musculoskeletal: Negative for back pain and myalgias.    Allergies  Adhesive  [tape]  Home Medications   Prior to Admission medications   Medication Sig Start Date End Date Taking? Authorizing Provider  aspirin 81 MG tablet Take 81 mg by mouth daily.    Historical Provider, MD  calcium carbonate (OS-CAL - DOSED IN MG OF ELEMENTAL CALCIUM) 1250 (500 Ca) MG tablet Take by mouth.    Historical Provider, MD  estradiol (ESTRING) 2 MG vaginal ring Replace estring every 2 months 01/16/16   Huel Cote, NP  rizatriptan (MAXALT) 10 MG tablet TAKE 1 BY MOUTH AS NEEDED FOR MIGRAINE. MAY REPEAT IN 2 HOURS IF NEEDED 04/28/16   Debbrah Alar, NP  Vitamin D, Ergocalciferol, (DRISDOL) 50000 units CAPS capsule Take 1 capsule (50,000 Units total) by mouth every 7 (seven) days. 12/28/15   Debbrah Alar, NP   Meds Ordered and Administered this Visit  Medications - No data to display  BP 137/83 (BP Location: Left Arm)   Pulse 82   Temp 98 F (36.7 C) (Oral)   Ht 5\' 4"  (1.626 m)   Wt 132 lb (59.9 kg)   LMP 05/12/2013   SpO2 97%   BMI 22.66 kg/m  No data found.   Physical Exam  Constitutional: She is oriented to person, place, and time. She appears  well-developed and well-nourished. No distress.  HENT:  Head: Normocephalic and atraumatic.  Mouth/Throat: Oropharynx is clear and moist.  Eyes: EOM are normal.  Neck: Normal range of motion.  Cardiovascular: Normal rate and regular rhythm.   Pulmonary/Chest: Effort normal and breath sounds normal. No respiratory distress. She has no wheezes. She has no rales.  Abdominal: Soft. She exhibits no distension. There is no tenderness. There is no CVA tenderness.  Musculoskeletal: Normal range of motion.  Neurological: She is alert and oriented to person, place, and time.  Skin: Skin is warm and dry. She is not diaphoretic.  Psychiatric: She has a normal mood and affect. Her behavior is normal.  Nursing note and vitals reviewed.   Urgent Care Course   Clinical Course     Procedures (including critical care time)  Labs  Review Labs Reviewed - No data to display  Imaging Review No results found.   MDM   1. Polyuria   2. Nocturia    Pt c/o polyuria and nocturia.   Hx of UTI about 1 year ago.  There is trace hgb in urine, otherwise no evidence of infection. Culture sent due to pt being symptomatic and hx of prior UTI, will notify pt if antibiotics indicated based off culture.  She may try OTC Azo to help  With symptoms in the meantime. Encouraged f/u with PCP next week if not improving, especially if urine culture is normal.   Noland Fordyce, PA-C 05/01/16 1032

## 2016-05-02 LAB — URINE CULTURE: Organism ID, Bacteria: NO GROWTH

## 2016-05-03 ENCOUNTER — Telehealth: Payer: Self-pay | Admitting: Emergency Medicine

## 2016-05-30 ENCOUNTER — Ambulatory Visit (AMBULATORY_SURGERY_CENTER): Payer: Self-pay | Admitting: *Deleted

## 2016-05-30 VITALS — Ht 64.0 in | Wt 135.0 lb

## 2016-05-30 DIAGNOSIS — Z860100 Personal history of colon polyps, unspecified: Secondary | ICD-10-CM

## 2016-05-30 MED ORDER — NA SULFATE-K SULFATE-MG SULF 17.5-3.13-1.6 GM/177ML PO SOLN: ORAL | 0 refills | Status: DC

## 2016-05-30 NOTE — Progress Notes (Signed)
Patient denies any allergies to eggs or soy. Patient has nausea with anesthesia. Patient denies any oxygen use at home and does not take any diet/weight loss medications. EMMI education assisgned to patient on colonoscopy, this was explained and instructions given to patient. 

## 2016-06-13 ENCOUNTER — Ambulatory Visit (AMBULATORY_SURGERY_CENTER): Payer: Federal, State, Local not specified - PPO | Admitting: Gastroenterology

## 2016-06-13 ENCOUNTER — Encounter: Payer: Self-pay | Admitting: Gastroenterology

## 2016-06-13 VITALS — BP 127/77 | HR 72 | Temp 96.9°F | Resp 19 | Ht 64.0 in | Wt 135.0 lb

## 2016-06-13 DIAGNOSIS — Z86010 Personal history of colonic polyps: Secondary | ICD-10-CM

## 2016-06-13 DIAGNOSIS — D122 Benign neoplasm of ascending colon: Secondary | ICD-10-CM | POA: Diagnosis not present

## 2016-06-13 DIAGNOSIS — K589 Irritable bowel syndrome without diarrhea: Secondary | ICD-10-CM | POA: Diagnosis not present

## 2016-06-13 HISTORY — PX: COLONOSCOPY: SHX174

## 2016-06-13 MED ORDER — SODIUM CHLORIDE 0.9 % IV SOLN: 500.0000 mL | INTRAVENOUS | Status: DC

## 2016-06-13 NOTE — Progress Notes (Signed)
To PACU  Pt awake and Alert. Report to RN 

## 2016-06-13 NOTE — Progress Notes (Signed)
Called to room to assist during endoscopic procedure.  Patient ID and intended procedure confirmed with present staff. Received instructions for my participation in the procedure from the performing physician.  

## 2016-06-13 NOTE — Op Note (Signed)
[facility] Patient Name: Joanna Lambert Procedure Date: 06/13/2016 8:53 AM MRN: [id number] Endoscopist: Mauri Pole , MD Age: 61 Referring MD:  Date of Birth: 01-03-1956 Gender: Female Account #: [id number] Procedure:                Colonoscopy Indications:              High risk colon cancer surveillance: Personal                            history of colonic polyps, (Last colonoscopy: 2009                            with index polypectomy, subsequent surveillance                            colonoscopy: 2012 was normal, both wer preformed in                            [place]) Medicines:                Monitored Anesthesia Care Procedure:                Pre-Anesthesia Assessment:                           - Prior to the procedure, a History and Physical                            was performed, and patient medications and                            allergies were reviewed. The patient's tolerance of                            previous anesthesia was also reviewed. The risks                            and benefits of the procedure and the sedation                            options and risks were discussed with the patient.                            All questions were answered, and informed consent                            was obtained. Prior Anticoagulants: The patient has                            taken no previous anticoagulant or antiplatelet                            agents. ASA Grade Assessment: II - A patient with  mild systemic disease. After reviewing the risks                            and benefits, the patient was deemed in                            satisfactory condition to undergo the procedure.                           After obtaining informed consent, the colonoscope                            was passed under direct vision. Throughout the                            procedure, the patient's blood pressure, pulse,  and                            oxygen saturations were monitored continuously. The                            Model CF-HQ190L [id number]) scope was introduced                            through the anus and advanced to the the terminal                            ileum, with identification of the appendiceal                            orifice and IC valve. The colonoscopy was performed                            without difficulty. The patient tolerated the                            procedure well. The quality of the bowel                            preparation was good. The terminal ileum, ileocecal                            valve, appendiceal orifice, and rectum were                            photographed. Scope In: 8:59:11 AM Scope Out: 9:19:33 AM Scope Withdrawal Time: 0 hours 12 minutes 24 seconds  Total Procedure Duration: 0 hours 20 minutes 22 seconds  Findings:                 The perianal and digital rectal examinations were                            normal.  A 9 mm polyp was found in the ascending colon. The                            polyp was granular lateral spreading. The polyp was                            removed with a cold snare. Resection and retrieval                            were complete.                           Non-bleeding internal hemorrhoids were found during                            retroflexion. The hemorrhoids were small.                           The exam was otherwise without abnormality. Complications:            No immediate complications. Estimated Blood Loss:     Estimated blood loss was minimal. Impression:               - One 9 mm polyp in the ascending colon, removed                            with a cold snare. Resected and retrieved.                           - Non-bleeding internal hemorrhoids.                           - The examination was otherwise normal. Recommendation:           - Patient has a contact  number available for                            emergencies. The signs and symptoms of potential                            delayed complications were discussed with the                            patient. Return to normal activities tomorrow.                            Written discharge instructions were provided to the                            patient.                           - Resume previous diet.                           - Continue present medications.                           -  Await pathology results.                           - Repeat colonoscopy in 5 years for surveillance                            based on pathology results. Mauri Pole, MD 06/13/2016 9:24:30 AM This report has been signed electronically.

## 2016-06-13 NOTE — Patient Instructions (Addendum)
YOU HAD AN ENDOSCOPIC PROCEDURE TODAY AT [facility]:   Refer to the procedure report that was given to you for any specific questions about what was found during the examination.  If the procedure report does not answer your questions, please call your gastroenterologist to clarify.  If you requested that your care partner not be given the details of your procedure findings, then the procedure report has been included in a sealed envelope for you to review at your convenience later.  YOU SHOULD EXPECT: Some feelings of bloating in the abdomen. Passage of more gas than usual.  Walking can help get rid of the air that was put into your GI tract during the procedure and reduce the bloating. If you had a lower endoscopy (such as a colonoscopy or flexible sigmoidoscopy) you may notice spotting of blood in your stool or on the toilet paper. If you underwent a bowel prep for your procedure, you may not have a normal bowel movement for a few days.  Please Note:  You might notice some irritation and congestion in your nose or some drainage.  This is from the oxygen used during your procedure.  There is no need for concern and it should clear up in a day or so.  SYMPTOMS TO REPORT IMMEDIATELY:   Following lower endoscopy (colonoscopy or flexible sigmoidoscopy):  Excessive amounts of blood in the stool  Significant tenderness or worsening of abdominal pains  Swelling of the abdomen that is new, acute  Fever of 100F or higher   Following upper endoscopy (EGD)  Vomiting of blood or coffee ground material  New chest pain or pain under the shoulder blades  Painful or persistently difficult swallowing  New shortness of breath  Fever of 100F or higher  Black, tarry-looking stools  For urgent or emergent issues, a gastroenterologist can be reached at any hour by calling [phone].   DIET:  We do recommend a small meal at first, but then you may proceed to your regular diet.  Drink  plenty of fluids but you should avoid alcoholic beverages for 24 hours.  ACTIVITY:  You should plan to take it easy for the rest of today and you should NOT DRIVE or use heavy machinery until tomorrow (because of the sedation medicines used during the test).    FOLLOW UP: Our staff will call the number listed on your records the next business day following your procedure to check on you and address any questions or concerns that you may have regarding the information given to you following your procedure. If we do not reach you, we will leave a message.  However, if you are feeling well and you are not experiencing any problems, there is no need to return our call.  We will assume that you have returned to your regular daily activities without incident.  If any biopsies were taken you will be contacted by phone or by letter within the next 1-3 weeks.  Please call us at [phone] if you have not heard about the biopsies in 3 weeks.    SIGNATURES/CONFIDENTIALITY: You and/or your care partner have signed paperwork which will be entered into your electronic medical record.  These signatures attest to the fact that that the information above on your After Visit Summary has been reviewed and is understood.  Full responsibility of the confidentiality of this discharge information lies with you and/or your care-partner.    Handouts were given to your care partner on polyps  and hemorrhoids. You may resume your current medications today. Await biopsy results. Please call if any questions or concerns.

## 2016-06-15 ENCOUNTER — Other Ambulatory Visit: Payer: Self-pay | Admitting: Family

## 2016-06-16 ENCOUNTER — Telehealth: Payer: Self-pay

## 2016-06-16 NOTE — Telephone Encounter (Signed)
Called patient and informed her of all recommendations and will call the office to schedule an appointment if no better

## 2016-06-16 NOTE — Telephone Encounter (Signed)
  Follow up Call-  Call back number 06/13/2016  Post procedure Call Back phone  # [phone]  Permission to leave phone message Yes     Patient questions:  Do you have a fever, pain , or abdominal swelling? No. Pain Score  0 *  Have you tolerated food without any problems? Yes.    Have you been able to return to your normal activities? Yes.    Do you have any questions about your discharge instructions: Diet   No. Medications  No. Follow up visit  No.  Do you have questions or concerns about your Care? No.  Actions: * If pain score is 4 or above: No action needed, pain <4.

## 2016-06-16 NOTE — Telephone Encounter (Signed)
Dr Silverio Decamp this patient had a procedure with you on Friday, Please advise

## 2016-06-16 NOTE — Telephone Encounter (Signed)
She can try Ranitidine 150mg  twice daily as needed, if continues to have frequent episodes of heartburn despite H2 blocker please advise her to schedule office visit for further evaluation. Thanks

## 2016-06-19 ENCOUNTER — Encounter: Payer: Self-pay | Admitting: Gastroenterology

## 2016-07-02 ENCOUNTER — Ambulatory Visit: Payer: Self-pay | Admitting: Family

## 2016-07-04 ENCOUNTER — Ambulatory Visit (INDEPENDENT_AMBULATORY_CARE_PROVIDER_SITE_OTHER): Payer: Federal, State, Local not specified - PPO | Admitting: Family

## 2016-07-04 ENCOUNTER — Encounter: Payer: Self-pay | Admitting: Family

## 2016-07-04 VITALS — BP 127/81 | HR 73 | Temp 98.0°F | Resp 16 | Ht 64.0 in | Wt 136.0 lb

## 2016-07-04 DIAGNOSIS — G43809 Other migraine, not intractable, without status migrainosus: Secondary | ICD-10-CM | POA: Diagnosis not present

## 2016-07-04 DIAGNOSIS — K219 Gastro-esophageal reflux disease without esophagitis: Secondary | ICD-10-CM | POA: Diagnosis not present

## 2016-07-04 DIAGNOSIS — E559 Vitamin D deficiency, unspecified: Secondary | ICD-10-CM | POA: Diagnosis not present

## 2016-07-04 DIAGNOSIS — R599 Enlarged lymph nodes, unspecified: Secondary | ICD-10-CM

## 2016-07-04 MED ORDER — RIZATRIPTAN BENZOATE 10 MG PO TABS: ORAL_TABLET | ORAL | 5 refills | Status: DC

## 2016-07-04 MED ORDER — AMITRIPTYLINE HCL 10 MG PO TABS: 10.0000 mg | ORAL_TABLET | Freq: Every day | ORAL | 2 refills | Status: DC

## 2016-07-04 NOTE — Assessment & Plan Note (Signed)
Not taking a vit D supplement. Will check vit D level.

## 2016-07-04 NOTE — Progress Notes (Signed)
Pre visit review using our clinic review tool, if applicable. No additional management support is needed unless otherwise documented below in the visit note. 

## 2016-07-04 NOTE — Progress Notes (Addendum)
Subjective:    Patient ID: Joanna Lambert, female    DOB: 1955-06-28, 61 y.o.   MRN: [id number]  HPI  [name] is a 61 yr old female who presents today with chief complaint of migraine.  Reports that she continues to have 2-3 migraine a month. Uses maxalt prn.    GERD- recently started zantac for her gerd symptoms by GI. Reports that this is helping her gerd symptoms.   Vit D- reports that she completed the 12 week course of vitamin D back in the fall. She is no longer taking a supplement.   She also notes enlarged lymph node in her neck. This has been present for 3-4 months.   Review of Systems See HPI  Past Medical History:  Diagnosis Date  . Allergy    seasonal  . Migraines   . Ovarian cyst    right  . Restless legs   . Varicose veins   . Vitamin D deficiency 12/26/2014     Social History   Social History  . Marital status: Married    Spouse name: N/A  . Number of children: N/A  . Years of education: N/A   Occupational History  . Not on file.   Social History Main Topics  . Smoking status: Never Smoker  . Smokeless tobacco: Never Used  . Alcohol use Yes     Comment: once a month per pt  . Drug use: No  . Sexual activity: Yes   Other Topics Concern  . 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      Past Surgical History:  Procedure Laterality Date  . APPENDECTOMY    . BREAST SURGERY Right 2012   biopsy, benign cyst  . CARPAL TUNNEL RELEASE Bilateral 2010  . FOOT SURGERY Right 2011   bunion removal    Family History  Problem Relation Age of Onset  . Diabetes Mother   . Hypertension Mother 66  . Hyperlipidemia Mother   . Heart disease Father 51    pacemaker  . Atrial fibrillation Father 52  . Peripheral vascular disease Father     s/p Carotid Edartarectomy  . Colon  cancer Neg Hx     Allergies  Allergen Reactions  . Adhesive [Tape] Rash    Current Outpatient Prescriptions on File Prior to Visit  Medication Sig Dispense Refill  . aspirin 81 MG tablet Take 81 mg by mouth daily.    . calcium carbonate (OS-CAL - DOSED IN MG OF ELEMENTAL CALCIUM) 1250 (500 Ca) MG tablet Take by mouth.    . estradiol (ESTRING) 2 MG vaginal ring Replace estring every 2 months 2 each 4  . metroNIDAZOLE (METROCREAM) 0.75 % cream Apply 1 application topically daily.  11  . rizatriptan (MAXALT) 10 MG tablet TAKE 1 BY MOUTH AS NEEDED FOR MIGRAINE. MAY REPEAT IN 2 HOURS IF NEEDED 10 tablet 0  . Vitamin D, Ergocalciferol, (DRISDOL) 50000 units CAPS capsule Take 1 capsule (50,000 Units total) by mouth every 7 (seven) days. 12 capsule 0   Current Facility-Administered Medications on File Prior to Visit  Medication Dose Route Frequency Provider Last Rate Last Dose  . 0.9 %  sodium chloride infusion  500 mL Intravenous Continuous Mauri Pole, MD  BP 127/81 (BP Location: Right Arm, Patient Position: Sitting, Cuff Size: Normal)   Pulse 73   Temp 98 F (36.7 C)   Resp 16   Ht 5\' 4"  (1.626 m)   Wt 136 lb (61.7 kg)   LMP 05/12/2013   SpO2 100%   BMI 23.34 kg/m       Objective:   Physical Exam  Constitutional: She is oriented to person, place, and time. She appears well-developed and well-nourished.  HENT:  Head: Normocephalic and atraumatic.  Neck:    Prominent, non-tender mobile lymph node noted left upper anterior cervical neck.   Cardiovascular: Normal rate, regular rhythm and normal heart sounds.   No murmur heard. Pulmonary/Chest: Effort normal and breath sounds normal. No respiratory distress. She has no wheezes.  Musculoskeletal: She exhibits no edema.  Neurological: She is alert and oriented to person, place, and time.  Psychiatric: She has a normal mood and affect. Her behavior is normal. Judgment and thought content normal.            Assessment & Plan:  Enlarged lymph node- will obtain US to further characterize.

## 2016-07-04 NOTE — Assessment & Plan Note (Signed)
Uncontrolled. Will try trial of elavil for migraine prophylaxis.  Continue prn maxalt.

## 2016-07-04 NOTE — Patient Instructions (Signed)
Please complete lab work prior to leaving. Start elavil at bedtime.

## 2016-07-04 NOTE — Addendum Note (Signed)
Addended by: Debbrah Alar on: 07/04/2016 02:14 PM   Modules accepted: Orders

## 2016-07-04 NOTE — Assessment & Plan Note (Signed)
Stable on PPI, continue same.  

## 2016-07-07 LAB — VITAMIN D 1,25 DIHYDROXY
Vitamin D 1, 25 (OH)2 Total: 42 pg/mL (ref 18–72)
Vitamin D2 1, 25 (OH)2: 26 pg/mL
Vitamin D3 1, 25 (OH)2: 16 pg/mL

## 2016-07-09 ENCOUNTER — Ambulatory Visit (HOSPITAL_BASED_OUTPATIENT_CLINIC_OR_DEPARTMENT_OTHER)
Admission: RE | Admit: 2016-07-09 | Discharge: 2016-07-09 | Disposition: A | Discharge status: Home / Self Care | Payer: Federal, State, Local not specified - PPO | Source: Ambulatory Visit | Attending: Family | Admitting: Family

## 2016-07-09 DIAGNOSIS — R590 Localized enlarged lymph nodes: Secondary | ICD-10-CM | POA: Diagnosis not present

## 2016-07-09 DIAGNOSIS — R599 Enlarged lymph nodes, unspecified: Secondary | ICD-10-CM | POA: Diagnosis not present

## 2016-07-10 ENCOUNTER — Telehealth: Payer: Self-pay | Admitting: Family

## 2016-07-10 NOTE — Telephone Encounter (Signed)
Relation to [id number] Call back number: [phone]   Reason for call:  Patient returning call regarding imaging results, please advise

## 2016-07-10 NOTE — Progress Notes (Signed)
Tried to reach pt lvm to call back.  

## 2016-07-10 NOTE — Telephone Encounter (Signed)
Spoke with pt. about results  pt. voiced understanding.  Pt will call back to schedule 3 week follow up

## 2016-07-10 NOTE — Telephone Encounter (Signed)
-----   Message from Debbrah Alar, NP sent at 07/10/2016 12:57 PM EST ----- Ultrasound shows a mildly enlarged cervical lymph node.  I would like to see her back in the office in 3 weeks for re-evaluation.  If it is not improved at that time we may do a CT of her neck.

## 2016-08-06 ENCOUNTER — Ambulatory Visit (INDEPENDENT_AMBULATORY_CARE_PROVIDER_SITE_OTHER): Payer: Federal, State, Local not specified - PPO | Admitting: Family

## 2016-08-06 ENCOUNTER — Encounter: Payer: Self-pay | Admitting: Family

## 2016-08-06 VITALS — BP 119/75 | HR 77 | Temp 98.6°F | Resp 16 | Ht 64.0 in | Wt 135.2 lb

## 2016-08-06 DIAGNOSIS — G43809 Other migraine, not intractable, without status migrainosus: Secondary | ICD-10-CM

## 2016-08-06 DIAGNOSIS — Z91030 Bee allergy status: Secondary | ICD-10-CM | POA: Diagnosis not present

## 2016-08-06 DIAGNOSIS — K080 Exfoliation of teeth due to systemic causes: Secondary | ICD-10-CM | POA: Diagnosis not present

## 2016-08-06 DIAGNOSIS — R599 Enlarged lymph nodes, unspecified: Secondary | ICD-10-CM

## 2016-08-06 MED ORDER — EPINEPHRINE 0.3 MG/0.3ML IJ SOAJ
0.3000 mg | Freq: Once | INTRAMUSCULAR | 0 refills | Status: AC
Start: 2016-08-06 — End: 2016-08-06

## 2016-08-06 NOTE — Progress Notes (Signed)
Pre visit review using our clinic review tool, if applicable. No additional management support is needed unless otherwise documented below in the visit note. 

## 2016-08-06 NOTE — Progress Notes (Signed)
Subjective:    Patient ID: Joanna Lambert, female    DOB: 03-09-1956, 61 y.o.   MRN: [id number]  HPI  [name] is a 61 yr old female who presents today for follow up  enlarged cervical lymph node-She underwent an [place] which showed LN with diameter of .7 cm.   Migraines- was given trial of maxalt last visit.  She does not notice much of an improvement.  Sleeping better.  Thinks that it is "too early to tell" how it is going to work since starting the [place] coincided with changing her estrace ring.   Reports that she had a bee sting on her foot over the summer which became very swollen and black and blue.  Review of Systems See HPI  Past Medical History:  Diagnosis Date  . Allergy    seasonal  . Migraines   . Ovarian cyst    right  . Restless legs   . Varicose veins   . Vitamin D deficiency 12/26/2014     Social History   Social History  . Marital status: Married    Spouse name: N/A  . Number of children: N/A  . Years of education: N/A   Occupational History  . Not on file.   Social History Main Topics  . Smoking status: Never Smoker  . Smokeless tobacco: Never Used  . Alcohol use Yes     Comment: once a month per pt  . Drug use: No  . Sexual activity: Yes   Other Topics Concern  . 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      Past Surgical History:  Procedure Laterality Date  . APPENDECTOMY    . BREAST SURGERY Right 2012   biopsy, benign cyst  . CARPAL TUNNEL RELEASE Bilateral 2010  . FOOT SURGERY Right 2011   bunion removal    Family History  Problem Relation Age of Onset  . Diabetes Mother   . Hypertension Mother 79  . Hyperlipidemia Mother   . Heart disease Father 40    pacemaker  . Atrial fibrillation Father 54  . Peripheral vascular disease Father     s/p Carotid  Edartarectomy  . Colon cancer Neg Hx     Allergies  Allergen Reactions  . Adhesive [Tape] Rash    Current Outpatient Prescriptions on File Prior to Visit  Medication Sig Dispense Refill  . amitriptyline (ELAVIL) 10 MG tablet Take 1 tablet (10 mg total) by mouth at bedtime. 30 tablet 2  . aspirin 81 MG tablet Take 81 mg by mouth daily.    . calcium carbonate (OS-CAL - DOSED IN MG OF ELEMENTAL CALCIUM) 1250 (500 Ca) MG tablet Take by mouth.    . estradiol (ESTRING) 2 MG vaginal ring Replace estring every 2 months 2 each 4  . metroNIDAZOLE (METROCREAM) 0.75 % cream Apply 1 application topically daily.  11  . ranitidine (ZANTAC) 150 MG/10ML syrup Take by mouth 2 (two) times daily.    . rizatriptan (MAXALT) 10 MG tablet TAKE 1 BY MOUTH AS NEEDED FOR MIGRAINE. MAY REPEAT IN 2 HOURS IF NEEDED 10 tablet 5   Current Facility-Administered Medications on File Prior to Visit  Medication Dose Route Frequency Provider Last Rate Last Dose  . 0.9 %  sodium chloride  infusion  500 mL Intravenous Continuous Kavitha Nandigam V, MD        BP 119/75 (BP Location: Right Arm, Patient Position: Sitting)   Pulse 77   Temp 98.6 F (37 C) (Oral)   Resp 16   Ht 5\' 4"  (1.626 m)   Wt 135 lb 3.2 oz (61.3 kg)   LMP 05/12/2013   SpO2 100%   BMI 23.21 kg/m       Objective:   Physical Exam  Constitutional: She appears well-developed and well-nourished.  Neck:  Palpable left anterior upper cervical LN, without tenderness or enlargement.   Cardiovascular: Normal rate, regular rhythm and normal heart sounds.   No murmur heard. Pulmonary/Chest: Effort normal and breath sounds normal. No respiratory distress. She has no wheezes.  Psychiatric: She has a normal mood and affect. Her behavior is normal. Judgment and thought content normal.          Assessment & Plan:  Cervical lymph node- WNL, reassurance provided.

## 2016-08-06 NOTE — Assessment & Plan Note (Signed)
About the same. Continue current dose of elavil. If not improved in 3 months, consider increasing dose to 25mg .

## 2016-08-06 NOTE — Assessment & Plan Note (Signed)
Gave rx for epipen and advised pt on appropriate time/use of epipen. She is advised to keep on hand at all times as well as liquid benadryl.

## 2016-09-17 DIAGNOSIS — H35412 Lattice degeneration of retina, left eye: Secondary | ICD-10-CM | POA: Diagnosis not present

## 2016-09-17 DIAGNOSIS — H04123 Dry eye syndrome of bilateral lacrimal glands: Secondary | ICD-10-CM | POA: Diagnosis not present

## 2016-09-17 DIAGNOSIS — H1851 Endothelial corneal dystrophy: Secondary | ICD-10-CM | POA: Diagnosis not present

## 2016-09-17 DIAGNOSIS — H2513 Age-related nuclear cataract, bilateral: Secondary | ICD-10-CM | POA: Diagnosis not present

## 2016-09-19 ENCOUNTER — Other Ambulatory Visit: Payer: Self-pay | Admitting: Family

## 2016-09-24 ENCOUNTER — Encounter: Payer: Self-pay | Admitting: Gynecology

## 2016-09-30 ENCOUNTER — Encounter: Payer: Self-pay | Admitting: Family

## 2016-09-30 ENCOUNTER — Ambulatory Visit (INDEPENDENT_AMBULATORY_CARE_PROVIDER_SITE_OTHER): Payer: Federal, State, Local not specified - PPO | Admitting: Family

## 2016-09-30 VITALS — BP 118/75 | HR 81 | Temp 98.3°F | Resp 16 | Ht 64.0 in | Wt 134.6 lb

## 2016-09-30 DIAGNOSIS — R590 Localized enlarged lymph nodes: Secondary | ICD-10-CM

## 2016-09-30 DIAGNOSIS — G43809 Other migraine, not intractable, without status migrainosus: Secondary | ICD-10-CM

## 2016-09-30 MED ORDER — RIZATRIPTAN BENZOATE 10 MG PO TABS: ORAL_TABLET | ORAL | 5 refills | Status: DC

## 2016-09-30 MED ORDER — AMITRIPTYLINE HCL 10 MG PO TABS: ORAL_TABLET | ORAL | 5 refills | Status: DC

## 2016-09-30 NOTE — Assessment & Plan Note (Signed)
Stable/improved on elavil. Continue same.

## 2016-09-30 NOTE — Progress Notes (Signed)
Subjective:    Patient ID: Joanna Lambert, female    DOB: 12/07/1955, 61 y.o.   MRN: [id number]  HPI  Migraine- patient is maintained on elavil and prn imitrex. Reports sleeping better.  Headaches are less frequent.  Has had 2 migraines in the last month.  These tend to occur when she is nearing time to change her estrace ring. Uses maxalt prn, usually requires 2 doses.   Notes lymph node is less prominent.   Review of Systems See HPI  Past Medical History:  Diagnosis Date  . Allergy    seasonal  . Migraines   . Ovarian cyst    right  . Restless legs   . Varicose veins   . Vitamin D deficiency 12/26/2014     Social History   Social History  . Marital status: Married    Spouse name: N/A  . Number of children: N/A  . Years of education: N/A   Occupational History  . Not on file.   Social History Main Topics  . Smoking status: Never Smoker  . Smokeless tobacco: Never Used  . Alcohol use Yes     Comment: once a month per pt  . Drug use: No  . Sexual activity: Yes   Other Topics Concern  . 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      Past Surgical History:  Procedure Laterality Date  . APPENDECTOMY    . BREAST SURGERY Right 2012   biopsy, benign cyst  . CARPAL TUNNEL RELEASE Bilateral 2010  . FOOT SURGERY Right 2011   bunion removal    Family History  Problem Relation Age of Onset  . Diabetes Mother   . Hypertension Mother 42  . Hyperlipidemia Mother   . Heart disease Father 4       pacemaker  . Atrial fibrillation Father 3  . Peripheral vascular disease Father        s/p Carotid Edartarectomy  . Colon cancer Neg Hx     Allergies  Allergen Reactions  . Bee Venom     Local swelling  . Adhesive [Tape] Rash    Current Outpatient Prescriptions on File Prior to Visit   Medication Sig Dispense Refill  . amitriptyline (ELAVIL) 10 MG tablet TAKE 1 TABLET(10 MG) BY MOUTH AT BEDTIME 30 tablet 0  . aspirin 81 MG tablet Take 81 mg by mouth daily.    . calcium carbonate (OS-CAL - DOSED IN MG OF ELEMENTAL CALCIUM) 1250 (500 Ca) MG tablet Take by mouth.    . estradiol (ESTRING) 2 MG vaginal ring Replace estring every 2 months 2 each 4  . metroNIDAZOLE (METROCREAM) 0.75 % cream Apply 1 application topically daily.  11  . ranitidine (ZANTAC) 150 MG/10ML syrup Take by mouth 2 (two) times daily.    . rizatriptan (MAXALT) 10 MG tablet TAKE 1 BY MOUTH AS NEEDED FOR MIGRAINE. MAY REPEAT IN 2 HOURS IF NEEDED 10 tablet 5   Current Facility-Administered Medications on File Prior to Visit  Medication Dose Route Frequency Provider Last Rate Last Dose  . 0.9 %  sodium chloride infusion  500 mL Intravenous Continuous Nandigam, Kavitha V, MD        BP 118/75 (BP Location: Right Arm, Cuff Size: Normal)   Pulse  81   Temp 98.3 F (36.8 C) (Oral)   Resp 16   Ht 5\' 4"  (1.626 m)   Wt 134 lb 9.6 oz (61.1 kg)   LMP 05/12/2013   SpO2 99%   BMI 23.10 kg/m       Objective:   Physical Exam  Constitutional: She is oriented to person, place, and time. She appears well-developed and well-nourished.  HENT:  Head: Normocephalic and atraumatic.  Right Ear: Tympanic membrane and ear canal normal.  Left Ear: Tympanic membrane and ear canal normal.  Mouth/Throat: No oropharyngeal exudate, posterior oropharyngeal edema or posterior oropharyngeal erythema.  Neck: Neck supple. No thyromegaly present.  Cardiovascular: Normal rate, regular rhythm and normal heart sounds.   No murmur heard. Pulmonary/Chest: Effort normal and breath sounds normal. No respiratory distress. She has no wheezes.  Neurological: She is alert and oriented to person, place, and time.  Skin: Skin is warm and dry.  Psychiatric: She has a normal mood and affect. Her behavior is normal. Judgment and thought content  normal.          Assessment & Plan:  Enlarged cervical lymph node- resolved.

## 2016-11-06 ENCOUNTER — Ambulatory Visit: Payer: Self-pay | Admitting: Family

## 2016-11-07 ENCOUNTER — Ambulatory Visit: Payer: Self-pay | Admitting: Family

## 2017-02-11 DIAGNOSIS — K080 Exfoliation of teeth due to systemic causes: Secondary | ICD-10-CM | POA: Diagnosis not present

## 2017-03-04 ENCOUNTER — Other Ambulatory Visit: Payer: Self-pay | Admitting: Family

## 2017-03-04 DIAGNOSIS — Z1231 Encounter for screening mammogram for malignant neoplasm of breast: Secondary | ICD-10-CM

## 2017-03-18 ENCOUNTER — Ambulatory Visit (INDEPENDENT_AMBULATORY_CARE_PROVIDER_SITE_OTHER): Payer: Federal, State, Local not specified - PPO | Admitting: Women's Health

## 2017-03-18 ENCOUNTER — Other Ambulatory Visit: Payer: Federal, State, Local not specified - PPO

## 2017-03-18 ENCOUNTER — Encounter: Payer: Self-pay | Admitting: Women's Health

## 2017-03-18 DIAGNOSIS — N898 Other specified noninflammatory disorders of vagina: Secondary | ICD-10-CM

## 2017-03-18 MED ORDER — CLOTRIMAZOLE-BETAMETHASONE 1-0.05 % EX CREA: 1.0000 "application " | TOPICAL_CREAM | Freq: Two times a day (BID) | CUTANEOUS | 1 refills | Status: DC

## 2017-03-18 MED ORDER — ESTRADIOL 2 MG VA RING: VAGINAL_RING | VAGINAL | 4 refills | Status: DC

## 2017-03-18 NOTE — Patient Instructions (Signed)
Health Maintenance for Postmenopausal Women Menopause is a normal process in which your reproductive ability comes to an end. This process happens gradually over a span of months to years, usually between the ages of 22 and 9. Menopause is complete when you have missed 12 consecutive menstrual periods. It is important to talk with your health care provider about some of the most common conditions that affect postmenopausal women, such as heart disease, cancer, and bone loss (osteoporosis). Adopting a healthy lifestyle and getting preventive care can help to promote your health and wellness. Those actions can also lower your chances of developing some of these common conditions. What should I know about menopause? During menopause, you may experience a number of symptoms, such as:  Moderate-to-severe hot flashes.  Night sweats.  Decrease in sex drive.  Mood swings.  Headaches.  Tiredness.  Irritability.  Memory problems.  Insomnia.  Choosing to treat or not to treat menopausal changes is an individual decision that you make with your health care provider. What should I know about hormone replacement therapy and supplements? Hormone therapy products are effective for treating symptoms that are associated with menopause, such as hot flashes and night sweats. Hormone replacement carries certain risks, especially as you become older. If you are thinking about using estrogen or estrogen with progestin treatments, discuss the benefits and risks with your health care provider. What should I know about heart disease and stroke? Heart disease, heart attack, and stroke become more likely as you age. This may be due, in part, to the hormonal changes that your body experiences during menopause. These can affect how your body processes dietary fats, triglycerides, and cholesterol. Heart attack and stroke are both medical emergencies. There are many things that you can do to help prevent heart disease  and stroke:  Have your blood pressure checked at least every 1-2 years. High blood pressure causes heart disease and increases the risk of stroke.  If you are 53-22 years old, ask your health care provider if you should take aspirin to prevent a heart attack or a stroke.  Do not use any tobacco products, including cigarettes, chewing tobacco, or electronic cigarettes. If you need help quitting, ask your health care provider.  It is important to eat a healthy diet and maintain a healthy weight. ? Be sure to include plenty of vegetables, fruits, low-fat dairy products, and lean protein. ? Avoid eating foods that are high in solid fats, added sugars, or salt (sodium).  Get regular exercise. This is one of the most important things that you can do for your health. ? Try to exercise for at least 150 minutes each week. The type of exercise that you do should increase your heart rate and make you sweat. This is known as moderate-intensity exercise. ? Try to do strengthening exercises at least twice each week. Do these in addition to the moderate-intensity exercise.  Know your numbers.Ask your health care provider to check your cholesterol and your blood glucose. Continue to have your blood tested as directed by your health care provider.  What should I know about cancer screening? There are several types of cancer. Take the following steps to reduce your risk and to catch any cancer development as early as possible. Breast Cancer  Practice breast self-awareness. ? This means understanding how your breasts normally appear and feel. ? It also means doing regular breast self-exams. Let your health care provider know about any changes, no matter how small.  If you are 40  or older, have a clinician do a breast exam (clinical breast exam or CBE) every year. Depending on your age, family history, and medical history, it may be recommended that you also have a yearly breast X-ray (mammogram).  If you  have a family history of breast cancer, talk with your health care provider about genetic screening.  If you are at high risk for breast cancer, talk with your health care provider about having an MRI and a mammogram every year.  Breast cancer (BRCA) gene test is recommended for women who have family members with BRCA-related cancers. Results of the assessment will determine the need for genetic counseling and BRCA1 and for BRCA2 testing. BRCA-related cancers include these types: ? Breast. This occurs in males or females. ? Ovarian. ? Tubal. This may also be called fallopian tube cancer. ? Cancer of the abdominal or pelvic lining (peritoneal cancer). ? Prostate. ? Pancreatic.  Cervical, Uterine, and Ovarian Cancer Your health care provider may recommend that you be screened regularly for cancer of the pelvic organs. These include your ovaries, uterus, and vagina. This screening involves a pelvic exam, which includes checking for microscopic changes to the surface of your cervix (Pap test).  For women ages 21-65, health care providers may recommend a pelvic exam and a Pap test every three years. For women ages 79-65, they may recommend the Pap test and pelvic exam, combined with testing for human papilloma virus (HPV), every five years. Some types of HPV increase your risk of cervical cancer. Testing for HPV may also be done on women of any age who have unclear Pap test results.  Other health care providers may not recommend any screening for nonpregnant women who are considered low risk for pelvic cancer and have no symptoms. Ask your health care provider if a screening pelvic exam is right for you.  If you have had past treatment for cervical cancer or a condition that could lead to cancer, you need Pap tests and screening for cancer for at least 20 years after your treatment. If Pap tests have been discontinued for you, your risk factors (such as having a new sexual partner) need to be  reassessed to determine if you should start having screenings again. Some women have medical problems that increase the chance of getting cervical cancer. In these cases, your health care provider may recommend that you have screening and Pap tests more often.  If you have a family history of uterine cancer or ovarian cancer, talk with your health care provider about genetic screening.  If you have vaginal bleeding after reaching menopause, tell your health care provider.  There are currently no reliable tests available to screen for ovarian cancer.  Lung Cancer Lung cancer screening is recommended for adults 69-62 years old who are at high risk for lung cancer because of a history of smoking. A yearly low-dose CT scan of the lungs is recommended if you:  Currently smoke.  Have a history of at least 30 pack-years of smoking and you currently smoke or have quit within the past 15 years. A pack-year is smoking an average of one pack of cigarettes per day for one year.  Yearly screening should:  Continue until it has been 15 years since you quit.  Stop if you develop a health problem that would prevent you from having lung cancer treatment.  Colorectal Cancer  This type of cancer can be detected and can often be prevented.  Routine colorectal cancer screening usually begins at  age 42 and continues through age 45.  If you have risk factors for colon cancer, your health care provider may recommend that you be screened at an earlier age.  If you have a family history of colorectal cancer, talk with your health care provider about genetic screening.  Your health care provider may also recommend using home test kits to check for hidden blood in your stool.  A small camera at the end of a tube can be used to examine your colon directly (sigmoidoscopy or colonoscopy). This is done to check for the earliest forms of colorectal cancer.  Direct examination of the colon should be repeated every  5-10 years until age 71. However, if early forms of precancerous polyps or small growths are found or if you have a family history or genetic risk for colorectal cancer, you may need to be screened more often.  Skin Cancer  Check your skin from head to toe regularly.  Monitor any moles. Be sure to tell your health care provider: ? About any new moles or changes in moles, especially if there is a change in a mole's shape or color. ? If you have a mole that is larger than the size of a pencil eraser.  If any of your family members has a history of skin cancer, especially at a young age, talk with your health care provider about genetic screening.  Always use sunscreen. Apply sunscreen liberally and repeatedly throughout the day.  Whenever you are outside, protect yourself by wearing long sleeves, pants, a wide-brimmed hat, and sunglasses.  What should I know about osteoporosis? Osteoporosis is a condition in which bone destruction happens more quickly than new bone creation. After menopause, you may be at an increased risk for osteoporosis. To help prevent osteoporosis or the bone fractures that can happen because of osteoporosis, the following is recommended:  If you are 46-71 years old, get at least 1,000 mg of calcium and at least 600 mg of vitamin D per day.  If you are older than age 55 but younger than age 65, get at least 1,200 mg of calcium and at least 600 mg of vitamin D per day.  If you are older than age 54, get at least 1,200 mg of calcium and at least 800 mg of vitamin D per day.  Smoking and excessive alcohol intake increase the risk of osteoporosis. Eat foods that are rich in calcium and vitamin D, and do weight-bearing exercises several times each week as directed by your health care provider. What should I know about how menopause affects my mental health? Depression may occur at any age, but it is more common as you become older. Common symptoms of depression  include:  Low or sad mood.  Changes in sleep patterns.  Changes in appetite or eating patterns.  Feeling an overall lack of motivation or enjoyment of activities that you previously enjoyed.  Frequent crying spells.  Talk with your health care provider if you think that you are experiencing depression. What should I know about immunizations? It is important that you get and maintain your immunizations. These include:  Tetanus, diphtheria, and pertussis (Tdap) booster vaccine.  Influenza every year before the flu season begins.  Pneumonia vaccine.  Shingles vaccine.  Your health care provider may also recommend other immunizations. This information is not intended to replace advice given to you by your health care provider. Make sure you discuss any questions you have with your health care provider. Document Released: 06/20/2005  Document Revised: 11/16/2015 Document Reviewed: 01/30/2015 Elsevier Interactive Patient Education  2018 [employer]  

## 2017-03-18 NOTE — Progress Notes (Signed)
Joanna Lambert [birth year] [id number]    History:    Presents for annual exam.  Postmenopausal with no bleeding using Estring for dryness as well as headache prevention which is working, changes  every 80 days with good relief. 2018 negative colon polyp 5 year follow-up. Normal Pap and mammogram history.  Past medical history, past surgical history, family history and social history were all reviewed and documented in the EPIC chart. Retired, helping care for grandchildren.  ROS:  A ROS was performed and pertinent positives and negatives are included.  Exam:  Vitals:   03/18/17 1055  BP: 134/80  Weight: 131 lb (59 kg)  Height: 5\' 4"  (1.626 m)   Body mass index is 22.49 kg/m.   General appearance:  Normal Thyroid:  Symmetrical, normal in size, without palpable masses or nodularity. Respiratory  Auscultation:  Clear without wheezing or rhonchi Cardiovascular  Auscultation:  Regular rate, without rubs, murmurs or gallops  Edema/varicosities:  Not grossly evident Abdominal  Soft,nontender, without masses, guarding or rebound.  Liver/spleen:  No organomegaly noted  Hernia:  None appreciated  Skin  Inspection:  Grossly normal   Breasts: Examined lying and sitting.     Right: Without masses, retractions, discharge or axillary adenopathy.     Left: Without masses, retractions, discharge or axillary adenopathy. Gentitourinary   Inguinal/mons:  Normal without inguinal adenopathy  External genitalia:  Normal  BUS/Urethra/Skene's glands:  Normal  Vagina:  Normal  Cervix:  Normal  Uterus:   normal in size, shape and contour.  Midline and mobile  Adnexa/parametria:     Rt: Without masses or tenderness.   Lt: Without masses or tenderness.  Anus and perineum: Normal  Digital rectal exam: Normal sphincter tone without palpated masses or tenderness  Assessment/Plan:  61 y.o. MWF G2 P2 for annual exam with no complaints.  Postmenopausal/no HRT with Estring Bee allergy Primary  care-labs  Plan: Estring prescription, proper use, will change it every 80 days for headache prevention. Reviewed minimal systemic effects but has decreased headaches will continue. SBE's, continue annual screening mammogram, calcium rich diet, regular exercise, vitamin D 1000 daily encouraged. Last vitamin D level 42. Paps normal 2016, new screening guidelines reviewed. Refill of Lotrisone given per request. Uses sparingly    [facility], 11:31 AM 03/18/2017

## 2017-03-25 ENCOUNTER — Ambulatory Visit (HOSPITAL_BASED_OUTPATIENT_CLINIC_OR_DEPARTMENT_OTHER)
Admission: RE | Admit: 2017-03-25 | Discharge: 2017-03-25 | Disposition: A | Discharge status: Home / Self Care | Payer: Federal, State, Local not specified - PPO | Source: Ambulatory Visit | Attending: Family | Admitting: Family

## 2017-03-25 DIAGNOSIS — Z1231 Encounter for screening mammogram for malignant neoplasm of breast: Secondary | ICD-10-CM | POA: Insufficient documentation

## 2017-03-26 ENCOUNTER — Encounter: Payer: Self-pay | Admitting: Women's Health

## 2017-04-15 ENCOUNTER — Ambulatory Visit (INDEPENDENT_AMBULATORY_CARE_PROVIDER_SITE_OTHER): Payer: Federal, State, Local not specified - PPO | Admitting: Family

## 2017-04-15 ENCOUNTER — Encounter: Payer: Self-pay | Admitting: Family

## 2017-04-15 VITALS — BP 134/80 | HR 86 | Temp 98.1°F | Resp 16 | Ht 64.0 in | Wt 131.0 lb

## 2017-04-15 DIAGNOSIS — R634 Abnormal weight loss: Secondary | ICD-10-CM | POA: Diagnosis not present

## 2017-04-15 DIAGNOSIS — Z0000 Encounter for general adult medical examination without abnormal findings: Secondary | ICD-10-CM | POA: Diagnosis not present

## 2017-04-15 LAB — CBC WITH DIFFERENTIAL/PLATELET
Basophils Absolute: 0 10*3/uL (ref 0.0–0.1)
Basophils Relative: 0.4 % (ref 0.0–3.0)
Eosinophils Absolute: 0.1 10*3/uL (ref 0.0–0.7)
Eosinophils Relative: 1.3 % (ref 0.0–5.0)
HCT: 41.6 % (ref 36.0–46.0)
Hemoglobin: 13.8 g/dL (ref 12.0–15.0)
Lymphocytes Relative: 27.1 % (ref 12.0–46.0)
Lymphs Abs: 1.5 10*3/uL (ref 0.7–4.0)
MCHC: 33.2 g/dL (ref 30.0–36.0)
MCV: 93.5 fl (ref 78.0–100.0)
Monocytes Absolute: 0.6 10*3/uL (ref 0.1–1.0)
Monocytes Relative: 10.4 % (ref 3.0–12.0)
Neutro Abs: 3.4 10*3/uL (ref 1.4–7.7)
Neutrophils Relative %: 60.8 % (ref 43.0–77.0)
Platelets: 280 10*3/uL (ref 150.0–400.0)
RBC: 4.45 Mil/uL (ref 3.87–5.11)
RDW: 12.3 % (ref 11.5–15.5)
WBC: 5.6 10*3/uL (ref 4.0–10.5)

## 2017-04-15 LAB — URINALYSIS, ROUTINE W REFLEX MICROSCOPIC
Bilirubin Urine: NEGATIVE
Hgb urine dipstick: NEGATIVE
Ketones, ur: NEGATIVE
Leukocytes, UA: NEGATIVE
Nitrite: NEGATIVE
RBC / HPF: NONE SEEN (ref 0–?)
Specific Gravity, Urine: <=1.005 — AB (ref 1.000–1.030)
Total Protein, Urine: NEGATIVE
Urine Glucose: NEGATIVE
Urobilinogen, UA: 0.2 (ref 0.0–1.0)
WBC, UA: NONE SEEN (ref 0–?)
pH: 5.5 (ref 5.0–8.0)

## 2017-04-15 LAB — HEPATIC FUNCTION PANEL
ALT: 15 U/L (ref 0–35)
AST: 20 U/L (ref 0–37)
Albumin: 4.9 g/dL (ref 3.5–5.2)
Alkaline Phosphatase: 77 U/L (ref 39–117)
Bilirubin, Direct: 0.1 mg/dL (ref 0.0–0.3)
Total Bilirubin: 0.7 mg/dL (ref 0.2–1.2)
Total Protein: 7.5 g/dL (ref 6.0–8.3)

## 2017-04-15 LAB — LIPID PANEL
Cholesterol: 214 mg/dL — ABNORMAL HIGH (ref 0–200)
HDL: 56.9 mg/dL (ref >39.00)
LDL Cholesterol: 143 mg/dL — ABNORMAL HIGH (ref 0–99)
NonHDL: 156.81
Total CHOL/HDL Ratio: 4
Triglycerides: 67 mg/dL (ref 0.0–149.0)
VLDL: 13.4 mg/dL (ref 0.0–40.0)

## 2017-04-15 LAB — BASIC METABOLIC PANEL
BUN: 14 mg/dL (ref 6–23)
CO2: 32 mEq/L (ref 19–32)
Calcium: 9.9 mg/dL (ref 8.4–10.5)
Chloride: 101 mEq/L (ref 96–112)
Creatinine, Ser: 0.8 mg/dL (ref 0.40–1.20)
GFR: 77.88 mL/min (ref >60.00)
Glucose, Bld: 89 mg/dL (ref 70–99)
Potassium: 5.1 mEq/L (ref 3.5–5.1)
Sodium: 140 mEq/L (ref 135–145)

## 2017-04-15 LAB — TSH: TSH: 1.52 u[IU]/mL (ref 0.35–4.50)

## 2017-04-15 NOTE — Progress Notes (Signed)
Subjective:    Patient ID: Joanna Lambert, female    DOB: 1955-10-18, 61 y.o.   MRN: [id number]  HPI  Patient presents today for complete physical.  Immunizations: tetanus and flu shot up to date due for shingrix but on backorder Diet: healthy Exercise: walks Colonoscopy: 06/23/16 Dexa:03/15/15 Pap Smear: 6/16 Mammogram:03/25/17  Weight loss- Reports that she dropped down to 124.  Increased her calories and came back up to 131.  Reports that she is eating closer to 3000 calories a day. Mom has hyperthyroid.   Wt Readings from Last 3 Encounters:  04/15/17 131 lb (59.4 kg)  03/18/17 131 lb (59.4 kg)  09/30/16 134 lb 9.6 oz (61.1 kg)      Review of Systems  Constitutional: Positive for unexpected weight change.  HENT: Negative for hearing loss and rhinorrhea.   Eyes: Negative for visual disturbance.  Respiratory: Negative for cough and shortness of breath.   Cardiovascular: Negative for chest pain and leg swelling.       Occasional palpitations  Gastrointestinal: Negative for blood in stool, constipation and diarrhea.  Genitourinary: Negative for dysuria, frequency and hematuria.  Musculoskeletal: Negative for arthralgias and myalgias.  Skin: Negative for rash.  Neurological: Negative for headaches.  Hematological: Negative for adenopathy.  Psychiatric/Behavioral:       Denies depression/anxiety   Past Medical History:  Diagnosis Date  . Allergy    seasonal  . Migraines   . Ovarian cyst    right  . Restless legs   . Varicose veins   . Vitamin D deficiency 12/26/2014     Social History   Socioeconomic History  . Marital status: Married    Spouse name: Not on file  . Number of children: Not on file  . Years of education: Not on file  . Highest education level: Not on file  Social Needs  . Financial resource strain: Not on file  . Food insecurity - worry: Not on file  . Food insecurity - inability: Not on file  . Transportation needs - medical: Not on file  .  Transportation needs - non-medical: Not on file  Occupational History  . Not on file  Tobacco Use  . Smoking status: Never Smoker  . Smokeless tobacco: Never Used  Substance and Sexual Activity  . Alcohol use: Yes    Comment: once a month per pt  . Drug use: No  . Sexual activity: Yes  Other Topics Concern  . 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      Past Surgical History:  Procedure Laterality Date  . APPENDECTOMY    . BREAST SURGERY Right 2012   biopsy, benign cyst  . CARPAL TUNNEL RELEASE Bilateral 2010  . FOOT SURGERY Right 2011   bunion removal    Family History  Problem Relation Age of Onset  . Diabetes Mother   . Hypertension Mother 69  . Hyperlipidemia Mother   . Heart disease Mother   . Heart disease Father 51       pacemaker  . Atrial fibrillation Father 49  . Peripheral vascular disease Father        s/p Carotid Edartarectomy  . Colon cancer Neg Hx     Allergies  Allergen Reactions  . Bee Venom  Local swelling  . Adhesive [Tape] Rash    Current Outpatient Medications on File Prior to Visit  Medication Sig Dispense Refill  . aspirin 81 MG tablet Take 81 mg by mouth daily.    . calcium carbonate (OS-CAL - DOSED IN MG OF ELEMENTAL CALCIUM) 1250 (500 Ca) MG tablet Take by mouth.    . clotrimazole-betamethasone (LOTRISONE) cream Apply 1 application 2 (two) times daily topically. 45 g 1  . estradiol (ESTRING) 2 MG vaginal ring Replace estring every 2 months 2 each 4  . metroNIDAZOLE (METROCREAM) 0.75 % cream Apply 1 application topically daily.  11  . ranitidine (ZANTAC) 150 MG/10ML syrup Take by mouth 2 (two) times daily.    . rizatriptan (MAXALT) 10 MG tablet TAKE 1 BY MOUTH AS NEEDED FOR MIGRAINE. MAY REPEAT IN 2 HOURS IF NEEDED 10 tablet 5   Current  Facility-Administered Medications on File Prior to Visit  Medication Dose Route Frequency Provider Last Rate Last Dose  . 0.9 %  sodium chloride infusion  500 mL Intravenous Continuous Nandigam, Kavitha V, MD        BP 134/80 (BP Location: Right Arm, Patient Position: Sitting, Cuff Size: Small)   Pulse 86   Temp 98.1 F (36.7 C) (Oral)   Resp 16   Ht 5\' 4"  (1.626 m)   Wt 131 lb (59.4 kg)   LMP 05/12/2013   SpO2 100%   BMI 22.49 kg/m       Objective:   Physical Exam  Physical Exam  Constitutional: She is oriented to person, place, and time. She appears well-developed and well-nourished. No distress.  HENT:  Head: Normocephalic and atraumatic.  Right Ear: Tympanic membrane and ear canal normal.  Left Ear: Tympanic membrane and ear canal normal.  Mouth/Throat: Oropharynx is clear and moist.  Eyes: Pupils are equal, round, and reactive to light. No scleral icterus.  Neck: Normal range of motion. No thyromegaly present.  Cardiovascular: Normal rate and regular rhythm.   No murmur heard. Pulmonary/Chest: Effort normal and breath sounds normal. No respiratory distress. He has no wheezes. She has no rales. She exhibits no tenderness.  Abdominal: Soft. Bowel sounds are normal. She exhibits no distension and no mass. There is no tenderness. There is no rebound and no guarding.  Musculoskeletal: She exhibits no edema.  Lymphadenopathy:    She has no cervical adenopathy.  Neurological: She is alert and oriented to person, place, and time. She has normal patellar reflexes. She exhibits normal muscle tone. Coordination normal.  Skin: Skin is warm and dry.  Psychiatric: She has a normal mood and affect. Her behavior is normal. Judgment and thought content normal.  Breasts: Examined lying Right: Without masses, retractions, discharge or axillary adenopathy.  Left: Without masses, retractions, discharge or axillary adenopathy.         Assessment & Plan:         Assessment & Plan:   Preventative care- encouraged pt to continue healthy diet, regular exercise. EKG tracing is personally reviewed.  EKG notes NSR.  No acute changes. Obtain routine lab work. Pap, mammo and colo up to date.   Weight loss- check TSH- ? Hyperthyroid due to c/o palpitations as well and family hx.

## 2017-04-15 NOTE — Patient Instructions (Signed)
Please complete lab work prior to leaving. Call if you have any further weight loss. Continue healthy diet and regular exercise.

## 2017-07-11 ENCOUNTER — Other Ambulatory Visit: Payer: Self-pay | Admitting: Family

## 2017-07-13 NOTE — Telephone Encounter (Signed)
Maxalt refill sent, Mychart message sent to pt to schedule follow up.

## 2017-07-29 ENCOUNTER — Encounter: Payer: Self-pay | Admitting: Family

## 2017-07-29 ENCOUNTER — Ambulatory Visit (INDEPENDENT_AMBULATORY_CARE_PROVIDER_SITE_OTHER): Payer: Federal, State, Local not specified - PPO | Admitting: Family

## 2017-07-29 VITALS — BP 119/71 | HR 70 | Temp 98.6°F | Resp 16 | Ht 64.0 in | Wt 134.8 lb

## 2017-07-29 DIAGNOSIS — J029 Acute pharyngitis, unspecified: Secondary | ICD-10-CM

## 2017-07-29 DIAGNOSIS — R634 Abnormal weight loss: Secondary | ICD-10-CM | POA: Diagnosis not present

## 2017-07-29 DIAGNOSIS — G43909 Migraine, unspecified, not intractable, without status migrainosus: Secondary | ICD-10-CM | POA: Diagnosis not present

## 2017-07-29 MED ORDER — RIZATRIPTAN BENZOATE 10 MG PO TABS: ORAL_TABLET | ORAL | 1 refills | Status: DC

## 2017-07-29 NOTE — Progress Notes (Signed)
Subjective:    Patient ID: Joanna Lambert, female    DOB: 01-Jul-1955, 62 y.o.   MRN: [id number]  HPI  62 year old female who presents today for follow-up.  Weight loss-last visit she noted that she had increased her caloric intake but was still having some weight loss.  We checked her thyroid function and found TSH to be within normal limits. Lab Results  Component Value Date   TSH 1.52 04/15/2017   Her weight is up 3 pounds from her last visit.  Wt Readings from Last 3 Encounters:  07/29/17 134 lb 12.8 oz (61.1 kg)  04/15/17 131 lb (59.4 kg)  03/18/17 131 lb (59.4 kg)   Migraine-she continues Maxalt on an as-needed basis.  Reports that weather changes sometime cause migraine.   Patient reports 2-day history of sore throat sore throat- reports pain is 5/10.   Review of Systems See HPI  Past Medical History:  Diagnosis Date  . Allergy    seasonal  . Migraines   . Ovarian cyst    right  . Restless legs   . Varicose veins   . Vitamin D deficiency 12/26/2014     Social History   Socioeconomic History  . Marital status: Married    Spouse name: Not on file  . Number of children: Not on file  . Years of education: Not on file  . Highest education level: Not on file  Social Needs  . Financial resource strain: Not on file  . Food insecurity - worry: Not on file  . Food insecurity - inability: Not on file  . Transportation needs - medical: Not on file  . Transportation needs - non-medical: Not on file  Occupational History  . Not on file  Tobacco Use  . Smoking status: Never Smoker  . Smokeless tobacco: Never Used  Substance and Sexual Activity  . Alcohol use: Yes    Comment: once a month per pt  . Drug use: No  . Sexual activity: Yes  Other Topics Concern  . 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      Past Surgical History:  Procedure Laterality Date  . APPENDECTOMY    . BREAST SURGERY Right 2012   biopsy, benign cyst  . CARPAL TUNNEL RELEASE Bilateral 2010  . FOOT SURGERY Right 2011   bunion removal    Family History  Problem Relation Age of Onset  . Diabetes Mother   . Hypertension Mother 54  . Hyperlipidemia Mother   . Heart disease Mother        Aortic stenosis  . Heart disease Father 95       pacemaker  . Atrial fibrillation Father 22  . Peripheral vascular disease Father        s/p Carotid Edartarectomy  . Dementia Father   . Colon cancer Neg Hx     Allergies  Allergen Reactions  . Bee Venom     Local swelling  . Adhesive [Tape] Rash    Current Outpatient Medications on File Prior to Visit  Medication Sig Dispense Refill  . aspirin 81 MG tablet Take 81 mg by mouth daily.    . calcium carbonate (OS-CAL - DOSED IN MG OF ELEMENTAL CALCIUM) 1250 (500 Ca) MG tablet Take by mouth.    Marland Kitchen  clotrimazole-betamethasone (LOTRISONE) cream Apply 1 application 2 (two) times daily topically. 45 g 1  . estradiol (ESTRING) 2 MG vaginal ring Replace estring every 2 months 2 each 4  . metroNIDAZOLE (METROCREAM) 0.75 % cream Apply 1 application topically daily.  11  . ranitidine (ZANTAC) 150 MG/10ML syrup Take by mouth 2 (two) times daily.     Current Facility-Administered Medications on File Prior to Visit  Medication Dose Route Frequency Provider Last Rate Last Dose  . 0.9 %  sodium chloride infusion  500 mL Intravenous Continuous Nandigam, Kavitha V, MD        BP 119/71 (BP Location: Right Arm, Patient Position: Sitting, Cuff Size: Small)   Pulse 70   Temp 98.6 F (37 C) (Oral)   Resp 16   Ht 5\' 4"  (1.626 m)   Wt 134 lb 12.8 oz (61.1 kg)   LMP 05/12/2013   SpO2 98%   BMI 23.14 kg/m       Objective:   Physical Exam  Constitutional: She appears well-developed and well-nourished.  HENT:  Mouth/Throat: Posterior oropharyngeal erythema  present. No oropharyngeal exudate or posterior oropharyngeal edema.  Neck:    Palpable slightly enlarged lymph node  Cardiovascular: Normal rate, regular rhythm and normal heart sounds.  No murmur heard. Pulmonary/Chest: Effort normal and breath sounds normal. No respiratory distress. She has no wheezes.  Psychiatric: She has a normal mood and affect. Her behavior is normal. Judgment and thought content normal.          Assessment & Plan:  Viral pharyngitis-symptoms and exam most consistent with viral pharyngitis.  She is advised to call if new or worsening symptoms or if symptoms are not improved in the next few days.  Weight loss-this has now stabilized.  She is advised to let me know if she has difficulty maintaining her current weight.  Migraines-stable with as needed use of Maxalt.  Continue same.

## 2017-07-29 NOTE — Patient Instructions (Addendum)
Please follow up in December for annual physical, sooner if problems/concerns.

## 2017-09-14 DIAGNOSIS — K080 Exfoliation of teeth due to systemic causes: Secondary | ICD-10-CM | POA: Diagnosis not present

## 2017-09-30 ENCOUNTER — Other Ambulatory Visit: Payer: Self-pay | Admitting: Women's Health

## 2017-09-30 DIAGNOSIS — N898 Other specified noninflammatory disorders of vagina: Secondary | ICD-10-CM

## 2017-09-30 NOTE — Telephone Encounter (Signed)
Per note on 03/18/2017 "Estring prescription, proper use, will change it every 80 days for headache prevention"

## 2017-11-18 DIAGNOSIS — H2513 Age-related nuclear cataract, bilateral: Secondary | ICD-10-CM | POA: Diagnosis not present

## 2017-11-18 DIAGNOSIS — H1851 Endothelial corneal dystrophy: Secondary | ICD-10-CM | POA: Diagnosis not present

## 2017-11-18 DIAGNOSIS — H04123 Dry eye syndrome of bilateral lacrimal glands: Secondary | ICD-10-CM | POA: Diagnosis not present

## 2017-11-18 DIAGNOSIS — H35412 Lattice degeneration of retina, left eye: Secondary | ICD-10-CM | POA: Diagnosis not present

## 2017-11-18 DIAGNOSIS — H5203 Hypermetropia, bilateral: Secondary | ICD-10-CM | POA: Diagnosis not present

## 2017-11-18 DIAGNOSIS — H524 Presbyopia: Secondary | ICD-10-CM | POA: Diagnosis not present

## 2017-12-04 ENCOUNTER — Other Ambulatory Visit: Payer: Self-pay | Admitting: Women's Health

## 2017-12-04 NOTE — Telephone Encounter (Signed)
Overdue for annual. Last one 01/16/2016.  Appt for CE set for 03/23/18.

## 2017-12-04 NOTE — Telephone Encounter (Signed)
Ok for refill? 

## 2018-02-04 IMAGING — MG 2D DIGITAL SCREENING BILATERAL MAMMOGRAM WITH CAD AND ADJUNCT TO
8 series · 9 of 24 positions shown · non-contrast
Comparison: Previous exam(s).

CLINICAL DATA: Screening.

EXAM:
2D DIGITAL SCREENING BILATERAL MAMMOGRAM WITH CAD AND ADJUNCT TOMO

[R MLO]
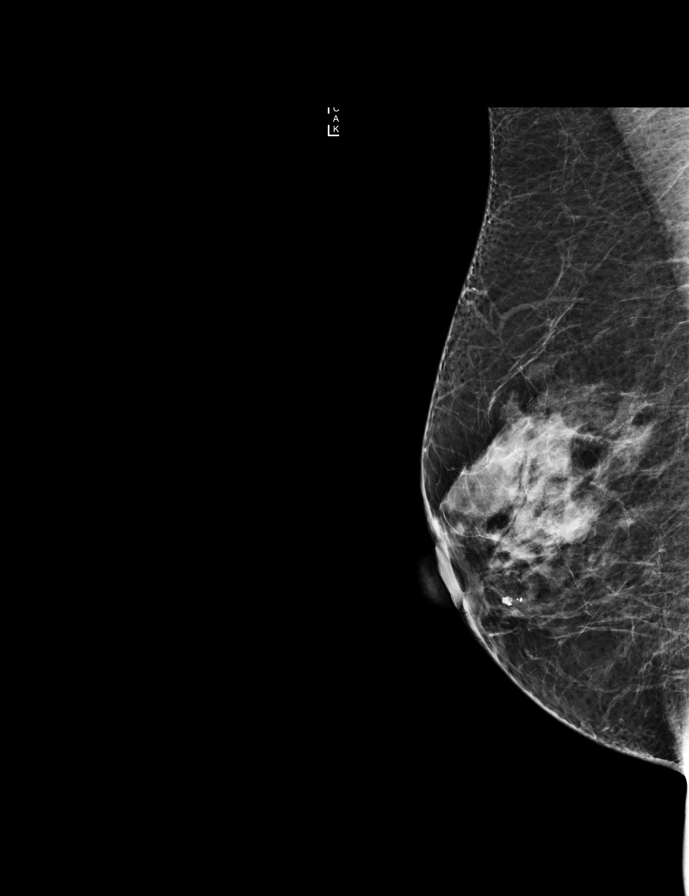

[L CC]
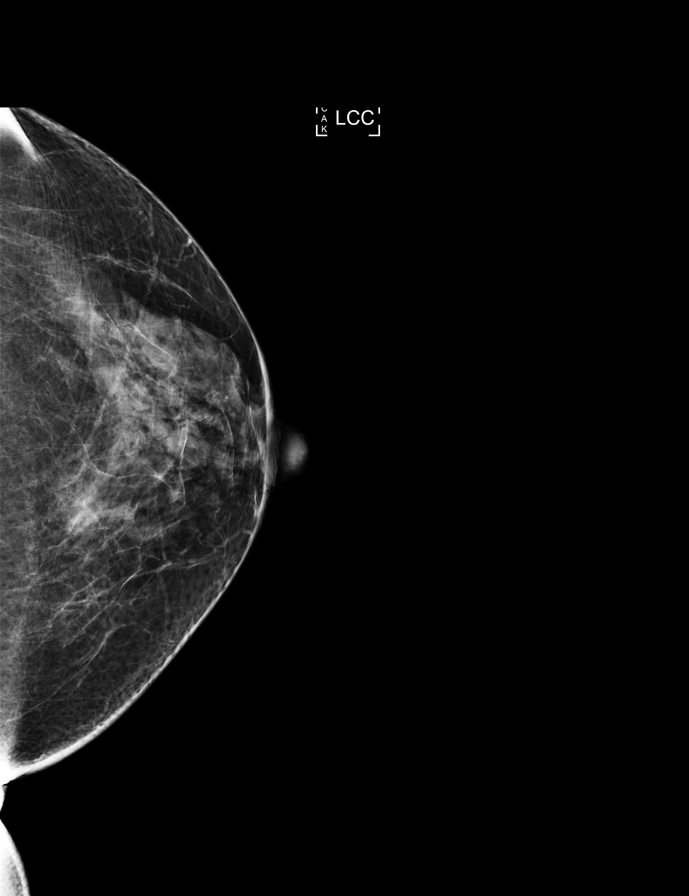

[R CC]
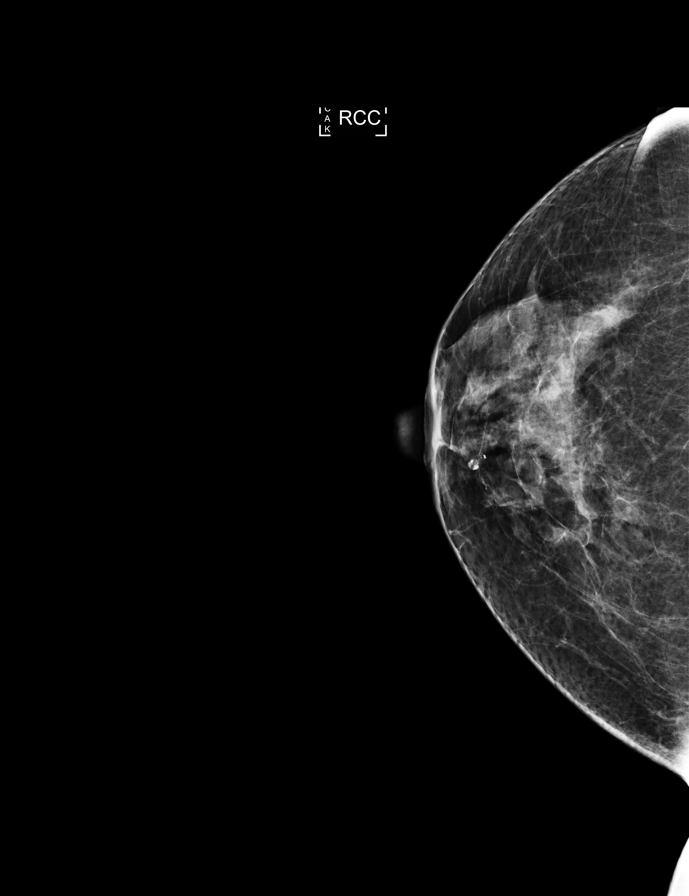

[L MLO]
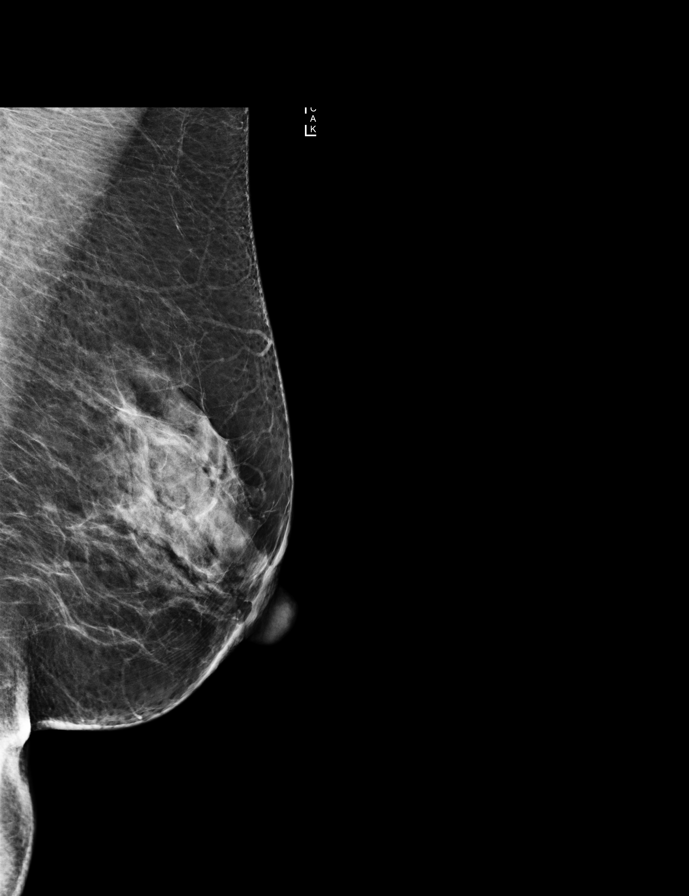

[R MLO tomo · 2 of 58 frames shown]
[frame 19/58]
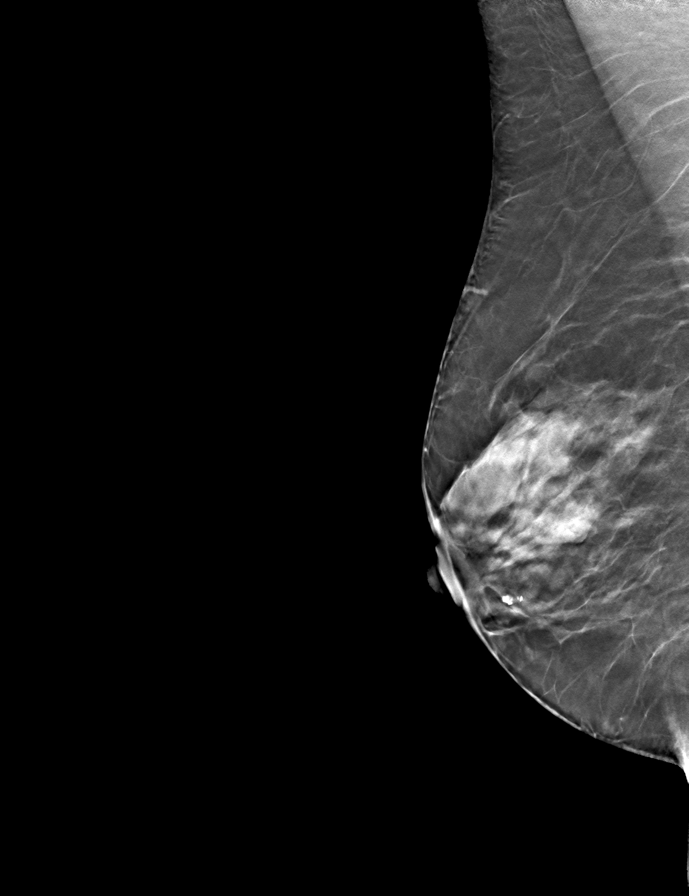
[frame 29/58]
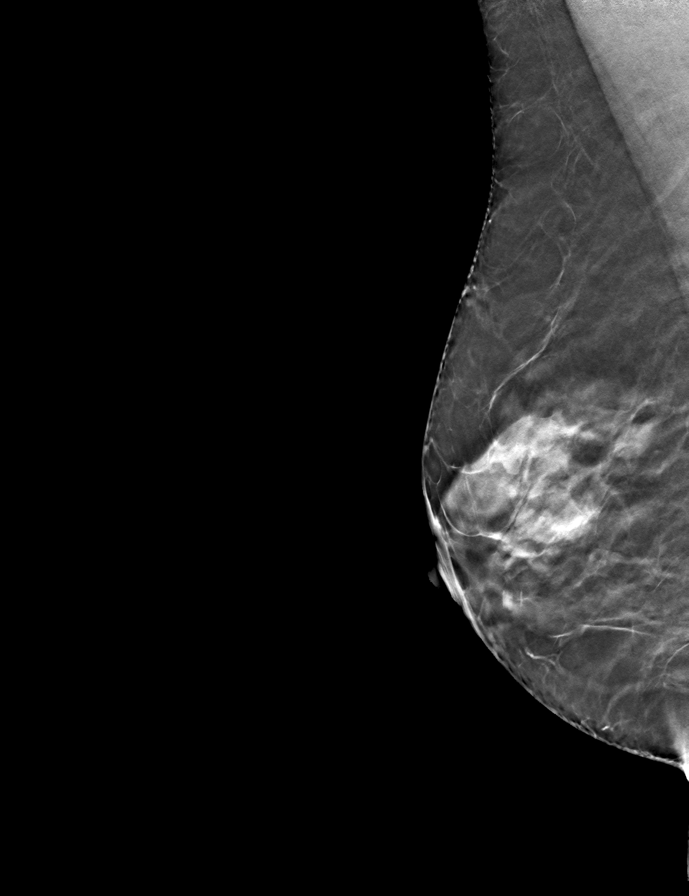

[R CC tomo · tomo slice 35/69.0]
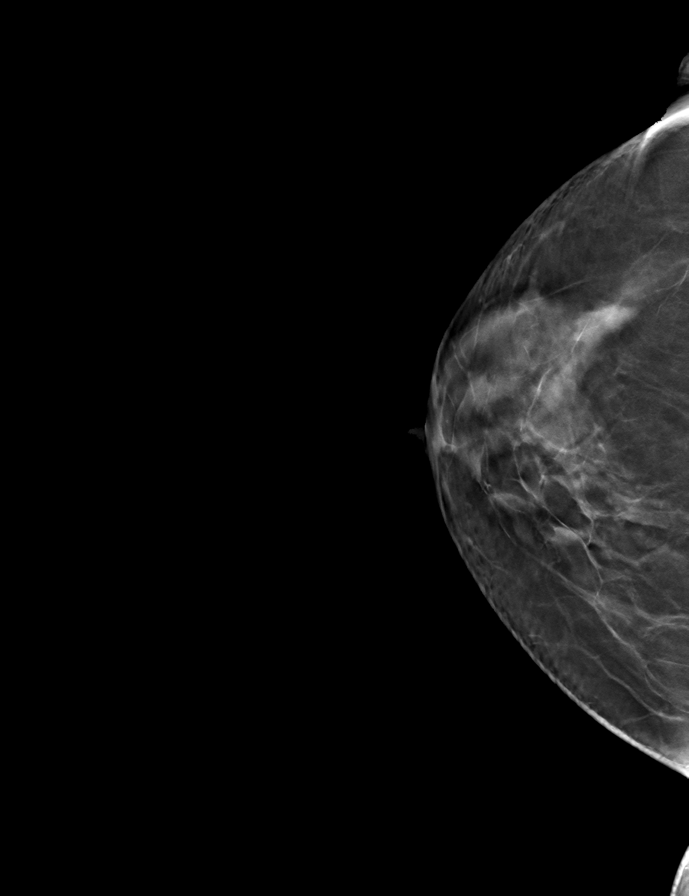

[L MLO tomo · tomo slice 33/65.0]
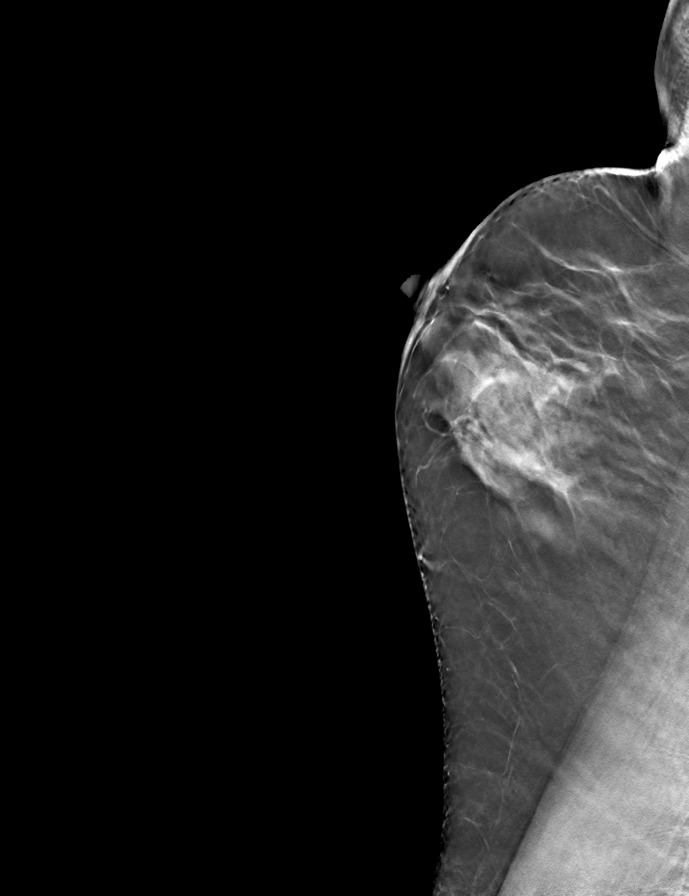

[L CC tomo · tomo slice 34/67.0]
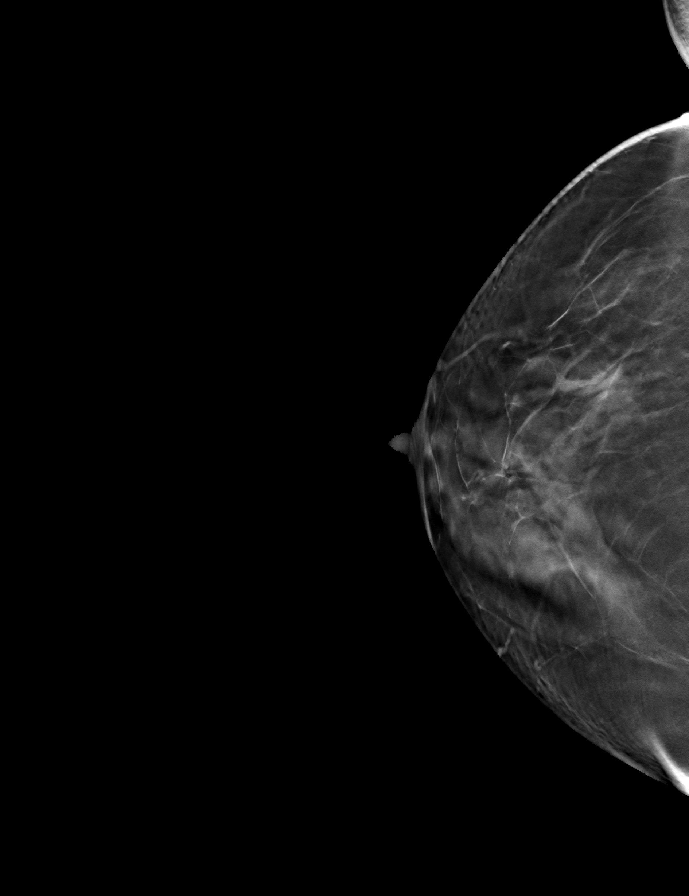

[9 of 24 positions shown; findings below may reference images not displayed]

ACR Breast Density Category c: The breast tissue is heterogeneously
dense, which may obscure small masses.
FINDINGS: There are no findings suspicious for malignancy. Images were
processed with CAD.
IMPRESSION: No mammographic evidence of malignancy. A result letter of this
screening mammogram will be mailed directly to the patient.

RECOMMENDATION:
Screening mammogram in one year. (Code:[id number])

BI-RADS CATEGORY  1: Negative.

## 2018-02-12 ENCOUNTER — Other Ambulatory Visit: Payer: Self-pay | Admitting: Family

## 2018-02-26 ENCOUNTER — Ambulatory Visit: Payer: Self-pay | Admitting: Family

## 2018-03-11 ENCOUNTER — Encounter: Payer: Self-pay | Admitting: Family

## 2018-03-22 ENCOUNTER — Encounter: Payer: Federal, State, Local not specified - PPO | Admitting: Women's Health

## 2018-03-23 ENCOUNTER — Encounter: Payer: Self-pay | Admitting: Women's Health

## 2018-03-23 ENCOUNTER — Ambulatory Visit (INDEPENDENT_AMBULATORY_CARE_PROVIDER_SITE_OTHER): Payer: Federal, State, Local not specified - PPO | Admitting: Women's Health

## 2018-03-23 VITALS — BP 122/80 | Ht 64.0 in | Wt 135.0 lb

## 2018-03-23 DIAGNOSIS — N898 Other specified noninflammatory disorders of vagina: Secondary | ICD-10-CM

## 2018-03-23 DIAGNOSIS — Z01419 Encounter for gynecological examination (general) (routine) without abnormal findings: Secondary | ICD-10-CM | POA: Diagnosis not present

## 2018-03-23 MED ORDER — RIZATRIPTAN BENZOATE 10 MG PO TABS: ORAL_TABLET | ORAL | 0 refills | Status: DC

## 2018-03-23 MED ORDER — ESTRADIOL 2 MG VA RING: VAGINAL_RING | VAGINAL | 4 refills | Status: DC

## 2018-03-23 NOTE — Patient Instructions (Signed)
Health Maintenance for Postmenopausal Women Menopause is a normal process in which your reproductive ability comes to an end. This process happens gradually over a span of months to years, usually between the ages of 22 and 9. Menopause is complete when you have missed 12 consecutive menstrual periods. It is important to talk with your health care provider about some of the most common conditions that affect postmenopausal women, such as heart disease, cancer, and bone loss (osteoporosis). Adopting a healthy lifestyle and getting preventive care can help to promote your health and wellness. Those actions can also lower your chances of developing some of these common conditions. What should I know about menopause? During menopause, you may experience a number of symptoms, such as:  Moderate-to-severe hot flashes.  Night sweats.  Decrease in sex drive.  Mood swings.  Headaches.  Tiredness.  Irritability.  Memory problems.  Insomnia.  Choosing to treat or not to treat menopausal changes is an individual decision that you make with your health care provider. What should I know about hormone replacement therapy and supplements? Hormone therapy products are effective for treating symptoms that are associated with menopause, such as hot flashes and night sweats. Hormone replacement carries certain risks, especially as you become older. If you are thinking about using estrogen or estrogen with progestin treatments, discuss the benefits and risks with your health care provider. What should I know about heart disease and stroke? Heart disease, heart attack, and stroke become more likely as you age. This may be due, in part, to the hormonal changes that your body experiences during menopause. These can affect how your body processes dietary fats, triglycerides, and cholesterol. Heart attack and stroke are both medical emergencies. There are many things that you can do to help prevent heart disease  and stroke:  Have your blood pressure checked at least every 1-2 years. High blood pressure causes heart disease and increases the risk of stroke.  If you are 53-22 years old, ask your health care provider if you should take aspirin to prevent a heart attack or a stroke.  Do not use any tobacco products, including cigarettes, chewing tobacco, or electronic cigarettes. If you need help quitting, ask your health care provider.  It is important to eat a healthy diet and maintain a healthy weight. ? Be sure to include plenty of vegetables, fruits, low-fat dairy products, and lean protein. ? Avoid eating foods that are high in solid fats, added sugars, or salt (sodium).  Get regular exercise. This is one of the most important things that you can do for your health. ? Try to exercise for at least 150 minutes each week. The type of exercise that you do should increase your heart rate and make you sweat. This is known as moderate-intensity exercise. ? Try to do strengthening exercises at least twice each week. Do these in addition to the moderate-intensity exercise.  Know your numbers.Ask your health care provider to check your cholesterol and your blood glucose. Continue to have your blood tested as directed by your health care provider.  What should I know about cancer screening? There are several types of cancer. Take the following steps to reduce your risk and to catch any cancer development as early as possible. Breast Cancer  Practice breast self-awareness. ? This means understanding how your breasts normally appear and feel. ? It also means doing regular breast self-exams. Let your health care provider know about any changes, no matter how small.  If you are 40  or older, have a clinician do a breast exam (clinical breast exam or CBE) every year. Depending on your age, family history, and medical history, it may be recommended that you also have a yearly breast X-ray (mammogram).  If you  have a family history of breast cancer, talk with your health care provider about genetic screening.  If you are at high risk for breast cancer, talk with your health care provider about having an MRI and a mammogram every year.  Breast cancer (BRCA) gene test is recommended for women who have family members with BRCA-related cancers. Results of the assessment will determine the need for genetic counseling and BRCA1 and for BRCA2 testing. BRCA-related cancers include these types: ? Breast. This occurs in males or females. ? Ovarian. ? Tubal. This may also be called fallopian tube cancer. ? Cancer of the abdominal or pelvic lining (peritoneal cancer). ? Prostate. ? Pancreatic.  Cervical, Uterine, and Ovarian Cancer Your health care provider may recommend that you be screened regularly for cancer of the pelvic organs. These include your ovaries, uterus, and vagina. This screening involves a pelvic exam, which includes checking for microscopic changes to the surface of your cervix (Pap test).  For women ages 21-65, health care providers may recommend a pelvic exam and a Pap test every three years. For women ages 79-65, they may recommend the Pap test and pelvic exam, combined with testing for human papilloma virus (HPV), every five years. Some types of HPV increase your risk of cervical cancer. Testing for HPV may also be done on women of any age who have unclear Pap test results.  Other health care providers may not recommend any screening for nonpregnant women who are considered low risk for pelvic cancer and have no symptoms. Ask your health care provider if a screening pelvic exam is right for you.  If you have had past treatment for cervical cancer or a condition that could lead to cancer, you need Pap tests and screening for cancer for at least 20 years after your treatment. If Pap tests have been discontinued for you, your risk factors (such as having a new sexual partner) need to be  reassessed to determine if you should start having screenings again. Some women have medical problems that increase the chance of getting cervical cancer. In these cases, your health care provider may recommend that you have screening and Pap tests more often.  If you have a family history of uterine cancer or ovarian cancer, talk with your health care provider about genetic screening.  If you have vaginal bleeding after reaching menopause, tell your health care provider.  There are currently no reliable tests available to screen for ovarian cancer.  Lung Cancer Lung cancer screening is recommended for adults 69-62 years old who are at high risk for lung cancer because of a history of smoking. A yearly low-dose CT scan of the lungs is recommended if you:  Currently smoke.  Have a history of at least 30 pack-years of smoking and you currently smoke or have quit within the past 15 years. A pack-year is smoking an average of one pack of cigarettes per day for one year.  Yearly screening should:  Continue until it has been 15 years since you quit.  Stop if you develop a health problem that would prevent you from having lung cancer treatment.  Colorectal Cancer  This type of cancer can be detected and can often be prevented.  Routine colorectal cancer screening usually begins at  age 42 and continues through age 45.  If you have risk factors for colon cancer, your health care provider may recommend that you be screened at an earlier age.  If you have a family history of colorectal cancer, talk with your health care provider about genetic screening.  Your health care provider may also recommend using home test kits to check for hidden blood in your stool.  A small camera at the end of a tube can be used to examine your colon directly (sigmoidoscopy or colonoscopy). This is done to check for the earliest forms of colorectal cancer.  Direct examination of the colon should be repeated every  5-10 years until age 71. However, if early forms of precancerous polyps or small growths are found or if you have a family history or genetic risk for colorectal cancer, you may need to be screened more often.  Skin Cancer  Check your skin from head to toe regularly.  Monitor any moles. Be sure to tell your health care provider: ? About any new moles or changes in moles, especially if there is a change in a mole's shape or color. ? If you have a mole that is larger than the size of a pencil eraser.  If any of your family members has a history of skin cancer, especially at a young age, talk with your health care provider about genetic screening.  Always use sunscreen. Apply sunscreen liberally and repeatedly throughout the day.  Whenever you are outside, protect yourself by wearing long sleeves, pants, a wide-brimmed hat, and sunglasses.  What should I know about osteoporosis? Osteoporosis is a condition in which bone destruction happens more quickly than new bone creation. After menopause, you may be at an increased risk for osteoporosis. To help prevent osteoporosis or the bone fractures that can happen because of osteoporosis, the following is recommended:  If you are 46-71 years old, get at least 1,000 mg of calcium and at least 600 mg of vitamin D per day.  If you are older than age 55 but younger than age 65, get at least 1,200 mg of calcium and at least 600 mg of vitamin D per day.  If you are older than age 54, get at least 1,200 mg of calcium and at least 800 mg of vitamin D per day.  Smoking and excessive alcohol intake increase the risk of osteoporosis. Eat foods that are rich in calcium and vitamin D, and do weight-bearing exercises several times each week as directed by your health care provider. What should I know about how menopause affects my mental health? Depression may occur at any age, but it is more common as you become older. Common symptoms of depression  include:  Low or sad mood.  Changes in sleep patterns.  Changes in appetite or eating patterns.  Feeling an overall lack of motivation or enjoyment of activities that you previously enjoyed.  Frequent crying spells.  Talk with your health care provider if you think that you are experiencing depression. What should I know about immunizations? It is important that you get and maintain your immunizations. These include:  Tetanus, diphtheria, and pertussis (Tdap) booster vaccine.  Influenza every year before the flu season begins.  Pneumonia vaccine.  Shingles vaccine.  Your health care provider may also recommend other immunizations. This information is not intended to replace advice given to you by your health care provider. Make sure you discuss any questions you have with your health care provider. Document Released: 06/20/2005  Document Revised: 11/16/2015 Document Reviewed: 01/30/2015 Elsevier Interactive Patient Education  2018 [employer]  

## 2018-03-23 NOTE — Progress Notes (Signed)
Joanna Lambert 03/19/1956 [id number]    History:    Presents for annual exam.  Postmenopausal with no bleeding using Estring every 2 months which has also helped with decreasing migraines.  2009 numerous benign colon polyps, 2012- colonoscopy, 2018 benign colon polyp, 5-year follow-up.  Normal Pap and mammogram history.  2015 normal DEXA.  Labs at primary care.  Past medical history, past surgical history, family history and social history were all reviewed and documented in the EPIC chart.  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  Going to [employer] with the grandchildren next week.  Mother hypertension, hypercholesteremia, father heart disease.  ROS:  A ROS was performed and pertinent positives and negatives are included.  Exam:  Vitals:   03/23/18 0927  Weight: 135 lb (61.2 kg)  Height: 5\' 4"  (1.626 m)   Body mass index is 23.17 kg/m.   General appearance:  Normal Thyroid:  Symmetrical, normal in size, without palpable masses or nodularity. Respiratory  Auscultation:  Clear without wheezing or rhonchi Cardiovascular  Auscultation:  Regular rate, without rubs, murmurs or gallops  Edema/varicosities:  Not grossly evident Abdominal  Soft,nontender, without masses, guarding or rebound.  Liver/spleen:  No organomegaly noted  Hernia:  None appreciated  Skin  Inspection:  Grossly normal   Breasts: Examined lying and sitting.     Right: Without masses, retractions, discharge or axillary adenopathy.     Left: Without masses, retractions, discharge or axillary adenopathy. Gentitourinary   Inguinal/mons:  Normal without inguinal adenopathy  External genitalia:  Normal  BUS/Urethra/Skene's glands:  Normal  Vagina:  Normal  Cervix:  Normal  Uterus:   normal in size, shape and contour.  Midline and mobile  Adnexa/parametria:     Rt: Without masses or tenderness.   Lt: Without masses or tenderness.  Anus and perineum: Normal  Digital rectal  exam: Normal sphincter tone without palpated masses or tenderness  Assessment/Plan:  62 y.o. MWF G2 P2 for annual exam with no complaints.  Postmenopausal with no bleeding on Estring Primary care manages labs IBS/GERD-GI manages Migraines without aura  Plan: Estring prescription, proper use, replaces every 2 months has had good relief of vaginal dryness as well as migraines.  SBE's, continue annual mammogram, calcium rich foods, vitamin D 2000 daily encouraged.  We will repeat DEXA next year.  Continue healthy lifestyle of regular exercise, balance and weightbearing exercise encouraged.  Maxalt 10 mg with migraines prescription, proper use given and reviewed.  Pap normal 2016, Pap with HR HPV typing, new screening guidelines reviewed.      [place], 9:30 AM 03/23/2018

## 2018-03-24 DIAGNOSIS — K080 Exfoliation of teeth due to systemic causes: Secondary | ICD-10-CM | POA: Diagnosis not present

## 2018-03-25 LAB — PAP, TP IMAGING W/ HPV RNA, RFLX HPV TYPE 16,18/45: HPV DNA High Risk: NOT DETECTED

## 2018-04-12 ENCOUNTER — Other Ambulatory Visit: Payer: Self-pay | Admitting: Family

## 2018-04-12 DIAGNOSIS — Z1231 Encounter for screening mammogram for malignant neoplasm of breast: Secondary | ICD-10-CM

## 2018-04-13 ENCOUNTER — Ambulatory Visit (HOSPITAL_BASED_OUTPATIENT_CLINIC_OR_DEPARTMENT_OTHER)
Admission: RE | Admit: 2018-04-13 | Discharge: 2018-04-13 | Disposition: A | Discharge status: Home / Self Care | Payer: Federal, State, Local not specified - PPO | Source: Ambulatory Visit | Attending: Family | Admitting: Family

## 2018-04-13 DIAGNOSIS — Z1231 Encounter for screening mammogram for malignant neoplasm of breast: Secondary | ICD-10-CM | POA: Insufficient documentation

## 2018-05-18 ENCOUNTER — Ambulatory Visit (INDEPENDENT_AMBULATORY_CARE_PROVIDER_SITE_OTHER): Payer: Federal, State, Local not specified - PPO | Admitting: Family

## 2018-05-18 ENCOUNTER — Encounter: Payer: Self-pay | Admitting: Family

## 2018-05-18 VITALS — BP 131/78 | HR 65 | Temp 98.2°F | Resp 16 | Ht 64.0 in | Wt 131.0 lb

## 2018-05-18 DIAGNOSIS — K219 Gastro-esophageal reflux disease without esophagitis: Secondary | ICD-10-CM | POA: Diagnosis not present

## 2018-05-18 DIAGNOSIS — E2839 Other primary ovarian failure: Secondary | ICD-10-CM

## 2018-05-18 DIAGNOSIS — Z0000 Encounter for general adult medical examination without abnormal findings: Secondary | ICD-10-CM

## 2018-05-18 DIAGNOSIS — R1011 Right upper quadrant pain: Secondary | ICD-10-CM | POA: Diagnosis not present

## 2018-05-18 DIAGNOSIS — Z0001 Encounter for general adult medical examination with abnormal findings: Secondary | ICD-10-CM

## 2018-05-18 LAB — CBC WITH DIFFERENTIAL/PLATELET
Basophils Absolute: 0 10*3/uL (ref 0.0–0.1)
Basophils Relative: 0.5 % (ref 0.0–3.0)
Eosinophils Absolute: 0.1 10*3/uL (ref 0.0–0.7)
Eosinophils Relative: 1.5 % (ref 0.0–5.0)
HCT: 41.8 % (ref 36.0–46.0)
Hemoglobin: 13.8 g/dL (ref 12.0–15.0)
Lymphocytes Relative: 31.5 % (ref 12.0–46.0)
Lymphs Abs: 1.8 10*3/uL (ref 0.7–4.0)
MCHC: 33.1 g/dL (ref 30.0–36.0)
MCV: 92 fl (ref 78.0–100.0)
Monocytes Absolute: 0.5 10*3/uL (ref 0.1–1.0)
Monocytes Relative: 9.7 % (ref 3.0–12.0)
Neutro Abs: 3.2 10*3/uL (ref 1.4–7.7)
Neutrophils Relative %: 56.8 % (ref 43.0–77.0)
Platelets: 318 10*3/uL (ref 150.0–400.0)
RBC: 4.54 Mil/uL (ref 3.87–5.11)
RDW: 12.2 % (ref 11.5–15.5)
WBC: 5.6 10*3/uL (ref 4.0–10.5)

## 2018-05-18 LAB — URINALYSIS, ROUTINE W REFLEX MICROSCOPIC
Bilirubin Urine: NEGATIVE
Hgb urine dipstick: NEGATIVE
Ketones, ur: NEGATIVE
Leukocytes, UA: NEGATIVE
Nitrite: NEGATIVE
RBC / HPF: NONE SEEN (ref 0–?)
Specific Gravity, Urine: 1.01 (ref 1.000–1.030)
Total Protein, Urine: NEGATIVE
Urine Glucose: NEGATIVE
Urobilinogen, UA: 0.2 (ref 0.0–1.0)
WBC, UA: NONE SEEN (ref 0–?)
pH: 6 (ref 5.0–8.0)

## 2018-05-18 LAB — HEPATIC FUNCTION PANEL
ALT: 17 U/L (ref 0–35)
AST: 18 U/L (ref 0–37)
Albumin: 4.5 g/dL (ref 3.5–5.2)
Alkaline Phosphatase: 88 U/L (ref 39–117)
Bilirubin, Direct: 0.1 mg/dL (ref 0.0–0.3)
Total Bilirubin: 0.7 mg/dL (ref 0.2–1.2)
Total Protein: 6.8 g/dL (ref 6.0–8.3)

## 2018-05-18 LAB — LIPID PANEL
Cholesterol: 199 mg/dL (ref 0–200)
HDL: 47.9 mg/dL (ref >39.00)
LDL Cholesterol: 125 mg/dL — ABNORMAL HIGH (ref 0–99)
NonHDL: 150.96
Total CHOL/HDL Ratio: 4
Triglycerides: 131 mg/dL (ref 0.0–149.0)
VLDL: 26.2 mg/dL (ref 0.0–40.0)

## 2018-05-18 LAB — BASIC METABOLIC PANEL
BUN: 13 mg/dL (ref 6–23)
CO2: 33 mEq/L — ABNORMAL HIGH (ref 19–32)
Calcium: 9.8 mg/dL (ref 8.4–10.5)
Chloride: 101 mEq/L (ref 96–112)
Creatinine, Ser: 0.78 mg/dL (ref 0.40–1.20)
GFR: 79.89 mL/min (ref >60.00)
Glucose, Bld: 83 mg/dL (ref 70–99)
Potassium: 5.1 mEq/L (ref 3.5–5.1)
Sodium: 138 mEq/L (ref 135–145)

## 2018-05-18 LAB — TSH: TSH: 2.42 u[IU]/mL (ref 0.35–4.50)

## 2018-05-18 MED ORDER — PANTOPRAZOLE SODIUM 40 MG PO TBEC: 40.0000 mg | DELAYED_RELEASE_TABLET | Freq: Every day | ORAL | 3 refills | Status: DC

## 2018-05-18 NOTE — Patient Instructions (Signed)
Please complete lab work prior to leaving. You should be contacted about scheduling your bone density and abdominal ultrasound.  Start protonix.

## 2018-05-18 NOTE — Progress Notes (Signed)
Subjective:    Patient ID: Joanna Lambert, female    DOB: 11-May-1956, 63 y.o.   MRN: [id number]  HPI  Patient presents today for complete physical.  Immunizations: up to date Diet: healthy Exercise: reports that she walks and does tai chi/silver sneakers Colonoscopy: 06/23/16, due 06/1221 Dexa: due Pap Smear:03/23/18 Mammogram:  Dental: up to date Vision: up to date Wt Readings from Last 3 Encounters:  05/18/18 131 lb (59.4 kg)  03/23/18 135 lb (61.2 kg)  07/29/17 134 lb 12.8 oz (61.1 kg)     Reports that her reflux is uncontrolled. Stopped zantac due to recall. Prevacid made her "fuzzy brained."  Famotidine otc not helping. (21m bid). Nexium gave her a headache.    Lymph node- "I still have that lymph node on my neck"    "hole in her tonsil" on the left x 3 months.  Reports "extreme discomfort" RUQ, worse at night, worse after I eat.  Feels like raspberries cause intense pain in this area and possibly raisins as well.   Review of Systems  Constitutional: Negative for unexpected weight change.  HENT: Negative for hearing loss and rhinorrhea.   Eyes: Negative for visual disturbance.  Respiratory: Negative for shortness of breath.   Cardiovascular: Negative for chest pain.  Gastrointestinal: Negative for blood in stool, constipation and diarrhea.  Genitourinary: Negative for dysuria, frequency and hematuria.  Musculoskeletal: Negative for arthralgias and myalgias.  Skin: Negative for rash.  Neurological:       Occasional migraines- about 5x a month, relieved by maxalt  Hematological:       See HPI  Psychiatric/Behavioral:       Denies depression/anxiety   Past Medical History:  Diagnosis Date  . Allergy    seasonal  . Migraines   . Ovarian cyst    right  . Restless legs   . Varicose veins   . Vitamin D deficiency 12/26/2014     Social History   Socioeconomic History  . Marital status: Married    Spouse name: Not on file  . Number of children: Not on file    . Years of education: Not on file  . Highest education level: Not on file  Occupational History  . Not on file  Social Needs  . Financial resource strain: Not on file  . Food insecurity:    Worry: Not on file    Inability: Not on file  . Transportation needs:    Medical: Not on file    Non-medical: Not on file  Tobacco Use  . Smoking status: Never Smoker  . Smokeless tobacco: Never Used  Substance and Sexual Activity  . Alcohol use: Yes    Comment: once a month per pt  . Drug use: No  . Sexual activity: Yes  Lifestyle  . Physical activity:    Days per week: Not on file    Minutes per session: Not on file  . Stress: Not on file  Relationships  . Social connections:    Talks on phone: Not on file    Gets together: Not on file    Attends religious service: Not on file    Active member of club or organization: Not on file    Attends meetings of clubs or organizations: Not on file    Relationship status: Not on file  . Intimate partner violence:    Fear of current or ex partner: Not on file    Emotionally abused: Not on file    Physically abused:  Not on file    Forced sexual activity: Not on file  Other Topics Concern  . 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      Past Surgical History:  Procedure Laterality Date  . APPENDECTOMY    . BREAST SURGERY Right 2012   biopsy, benign cyst  . CARPAL TUNNEL RELEASE Bilateral 2010  . FOOT SURGERY Right 2011   bunion removal    Family History  Problem Relation Age of Onset  . Diabetes Mother   . Hypertension Mother 23  . Hyperlipidemia Mother   . Heart disease Mother        Aortic stenosis  . Heart disease Father 71       pacemaker  . Atrial fibrillation Father 28  . Peripheral vascular disease Father        s/p Carotid Edartarectomy  .  Dementia Father   . Colon cancer Neg Hx     Allergies  Allergen Reactions  . Bee Venom     Local swelling  . Adhesive [Tape] Rash    Current Outpatient Medications on File Prior to Visit  Medication Sig Dispense Refill  . aspirin 81 MG tablet Take 81 mg by mouth daily.    . calcium carbonate (OS-CAL - DOSED IN MG OF ELEMENTAL CALCIUM) 1250 (500 Ca) MG tablet Take by mouth.    . clotrimazole-betamethasone (LOTRISONE) cream APPLY EXTERNALLY TO THE AFFECTED AREA TWICE DAILY 45 g 1  . estradiol (ESTRING) 2 MG vaginal ring INSERT( REPLACE) 1 RING VAGINALLY EVERY 2 MONTHS 2 each 4  . metroNIDAZOLE (METROCREAM) 0.75 % cream Apply 1 application topically daily.  11  . rizatriptan (MAXALT) 10 MG tablet May repeat in 2 hours if needed 10 tablet 0   Current Facility-Administered Medications on File Prior to Visit  Medication Dose Route Frequency Provider Last Rate Last Dose  . 0.9 %  sodium chloride infusion  500 mL Intravenous Continuous Nandigam, Kavitha V, MD        BP 131/78 (BP Location: Right Arm, Patient Position: Sitting, Cuff Size: Small)   Pulse 65   Temp 98.2 F (36.8 C) (Oral)   Resp 16   Ht '5\' 4"'  (1.626 m)   Wt 131 lb (59.4 kg)   LMP 05/12/2013   SpO2 100%   BMI 22.49 kg/m       Objective:   Physical Exam Physical Exam  Constitutional: She is oriented to person, place, and time. She appears well-developed and well-nourished. No distress.  HENT:  Head: Normocephalic and atraumatic.  Right Ear: Tympanic membrane and ear canal normal.  Left Ear: Tympanic membrane and ear canal normal.  Mouth/Throat: Oropharynx is clear and moist. (small cryptic tonsils) no tonsilar masses or abscesses noted Eyes: Pupils are equal, round, and reactive to light. No scleral icterus.  Neck: Normal range of motion. No thyromegaly present.  Cardiovascular: Normal rate and regular rhythm.   No murmur heard. Pulmonary/Chest: Effort normal and breath sounds normal. No respiratory distress.  He has no wheezes. She has no rales. She exhibits no tenderness.  Abdominal: Soft. Bowel sounds are normal. She exhibits no distension and no mass. There is no tenderness. There is no rebound and no guarding.  Musculoskeletal: She exhibits no edema.  Lymphadenopathy:    She has  a palpable left upper cervical LN which is <1cm in diameter and is non-tender Neurological: She is alert and oriented to person, place, and time. She exhibits normal muscle tone. Coordination normal.  Skin: Skin is warm and dry.  Psychiatric: She has a normal mood and affect. Her behavior is normal. Judgment and thought content normal.  Breasts: Examined lying Right: Without masses, retractions, discharge or axillary adenopathy.  Left: Without masses, retractions, discharge or axillary adenopathy.  Pelvic: deferred           Assessment & Plan:  Preventative care- continue healthy diet, exercise. Refer for dexa. Mammo/colo/immunizations reviewed and up to date. EKG notes NSR- + artifact but no acute changes noted.   RUQ pain- suspect GB etiology- check lft and abd [place]. Pt is advised to go to the ER if severe pain occurs.  Cervical LN- stable, size WNL.  Monitor.   GERD- uncontrolled- trial of protonix. I suspect GB could be a contributing factor as well.       Assessment & Plan:

## 2018-05-25 ENCOUNTER — Ambulatory Visit (HOSPITAL_BASED_OUTPATIENT_CLINIC_OR_DEPARTMENT_OTHER)
Admission: RE | Admit: 2018-05-25 | Discharge: 2018-05-25 | Disposition: A | Discharge status: Home / Self Care | Payer: Federal, State, Local not specified - PPO | Source: Ambulatory Visit | Attending: Family | Admitting: Family

## 2018-05-25 ENCOUNTER — Telehealth: Payer: Self-pay | Admitting: Family

## 2018-05-25 DIAGNOSIS — R1011 Right upper quadrant pain: Secondary | ICD-10-CM

## 2018-05-25 DIAGNOSIS — Z780 Asymptomatic menopausal state: Secondary | ICD-10-CM | POA: Diagnosis not present

## 2018-05-25 DIAGNOSIS — E2839 Other primary ovarian failure: Secondary | ICD-10-CM | POA: Diagnosis not present

## 2018-05-25 DIAGNOSIS — M8589 Other specified disorders of bone density and structure, multiple sites: Secondary | ICD-10-CM | POA: Diagnosis not present

## 2018-05-25 NOTE — Telephone Encounter (Signed)
Reviewed [place] results. No definite cholecystitis but there is an area of inflammation noted on [place]. I would like her to meet with a surgeon to further discuss.  Referral has been placed.

## 2018-05-27 ENCOUNTER — Encounter: Payer: Self-pay | Admitting: Family

## 2018-05-27 DIAGNOSIS — K080 Exfoliation of teeth due to systemic causes: Secondary | ICD-10-CM | POA: Diagnosis not present

## 2018-06-13 ENCOUNTER — Other Ambulatory Visit: Payer: Self-pay | Admitting: Women's Health

## 2018-06-14 NOTE — Telephone Encounter (Signed)
Ok for refill? 

## 2018-08-27 ENCOUNTER — Other Ambulatory Visit: Payer: Self-pay

## 2018-08-27 MED ORDER — PANTOPRAZOLE SODIUM 40 MG PO TBEC: 40.0000 mg | DELAYED_RELEASE_TABLET | Freq: Every day | ORAL | 3 refills | Status: DC

## 2018-08-28 ENCOUNTER — Encounter: Payer: Self-pay | Admitting: Family

## 2018-08-30 MED ORDER — PANTOPRAZOLE SODIUM 40 MG PO TBEC: 40.0000 mg | DELAYED_RELEASE_TABLET | Freq: Every day | ORAL | 1 refills | Status: DC

## 2018-09-16 ENCOUNTER — Encounter: Payer: Self-pay | Admitting: Family

## 2018-09-17 ENCOUNTER — Telehealth: Payer: Self-pay

## 2018-09-17 NOTE — Telephone Encounter (Signed)
Per patient it looks like insurance is requesting pa for pantoprazole. Can you please initiate.

## 2018-09-17 NOTE — Telephone Encounter (Signed)
PA will be initiated

## 2018-09-17 NOTE — Telephone Encounter (Signed)
PA initiated via Covermymeds; KEY: [id number]. PA approved.

## 2018-09-20 NOTE — Telephone Encounter (Signed)
This pa was approved

## 2018-09-20 NOTE — Telephone Encounter (Signed)
Message left with husband for patient to be aware pa was approved

## 2018-12-08 DIAGNOSIS — H5203 Hypermetropia, bilateral: Secondary | ICD-10-CM | POA: Diagnosis not present

## 2018-12-08 DIAGNOSIS — H35412 Lattice degeneration of retina, left eye: Secondary | ICD-10-CM | POA: Diagnosis not present

## 2018-12-08 DIAGNOSIS — H2513 Age-related nuclear cataract, bilateral: Secondary | ICD-10-CM | POA: Diagnosis not present

## 2018-12-08 DIAGNOSIS — H04123 Dry eye syndrome of bilateral lacrimal glands: Secondary | ICD-10-CM | POA: Diagnosis not present

## 2018-12-08 DIAGNOSIS — H524 Presbyopia: Secondary | ICD-10-CM | POA: Diagnosis not present

## 2018-12-08 DIAGNOSIS — H52203 Unspecified astigmatism, bilateral: Secondary | ICD-10-CM | POA: Diagnosis not present

## 2018-12-08 DIAGNOSIS — H1851 Endothelial corneal dystrophy: Secondary | ICD-10-CM | POA: Diagnosis not present

## 2018-12-31 ENCOUNTER — Other Ambulatory Visit: Payer: Self-pay | Admitting: Women's Health

## 2019-01-03 NOTE — Telephone Encounter (Signed)
Ok for refill? 

## 2019-03-25 ENCOUNTER — Other Ambulatory Visit (HOSPITAL_BASED_OUTPATIENT_CLINIC_OR_DEPARTMENT_OTHER): Payer: Self-pay | Admitting: Family

## 2019-03-25 ENCOUNTER — Other Ambulatory Visit: Payer: Self-pay

## 2019-03-25 DIAGNOSIS — Z1231 Encounter for screening mammogram for malignant neoplasm of breast: Secondary | ICD-10-CM

## 2019-03-28 ENCOUNTER — Encounter: Payer: Self-pay | Admitting: Women's Health

## 2019-03-28 ENCOUNTER — Other Ambulatory Visit: Payer: Self-pay

## 2019-03-28 ENCOUNTER — Ambulatory Visit (INDEPENDENT_AMBULATORY_CARE_PROVIDER_SITE_OTHER): Payer: Federal, State, Local not specified - PPO | Admitting: Women's Health

## 2019-03-28 VITALS — BP 132/80 | Ht 64.0 in | Wt 136.0 lb

## 2019-03-28 DIAGNOSIS — N898 Other specified noninflammatory disorders of vagina: Secondary | ICD-10-CM

## 2019-03-28 DIAGNOSIS — Z01419 Encounter for gynecological examination (general) (routine) without abnormal findings: Secondary | ICD-10-CM

## 2019-03-28 MED ORDER — ESTRING 2 MG VA RING: VAGINAL_RING | VAGINAL | 4 refills | Status: DC

## 2019-03-28 NOTE — Patient Instructions (Signed)
Vit D 2000 iu daily Good to see you!  Health Maintenance for Postmenopausal Women Menopause is a normal process in which your ability to get pregnant comes to an end. This process happens slowly over many months or years, usually between the ages of 74 and 70. Menopause is complete when you have missed your menstrual periods for 12 months. It is important to talk with your health care provider about some of the most common conditions that affect women after menopause (postmenopausal women). These include heart disease, cancer, and bone loss (osteoporosis). Adopting a healthy lifestyle and getting preventive care can help to promote your health and wellness. The actions you take can also lower your chances of developing some of these common conditions. What should I know about menopause? During menopause, you may get a number of symptoms, such as:  Hot flashes. These can be moderate or severe.  Night sweats.  Decrease in sex drive.  Mood swings.  Headaches.  Tiredness.  Irritability.  Memory problems.  Insomnia. Choosing to treat or not to treat these symptoms is a decision that you make with your health care provider. Do I need hormone replacement therapy?  Hormone replacement therapy is effective in treating symptoms that are caused by menopause, such as hot flashes and night sweats.  Hormone replacement carries certain risks, especially as you become older. If you are thinking about using estrogen or estrogen with progestin, discuss the benefits and risks with your health care provider. What is my risk for heart disease and stroke? The risk of heart disease, heart attack, and stroke increases as you age. One of the causes may be a change in the body's hormones during menopause. This can affect how your body uses dietary fats, triglycerides, and cholesterol. Heart attack and stroke are medical emergencies. There are many things that you can do to help prevent heart disease and  stroke. Watch your blood pressure  High blood pressure causes heart disease and increases the risk of stroke. This is more likely to develop in people who have high blood pressure readings, are of African descent, or are overweight.  Have your blood pressure checked: ? Every 3-5 years if you are 76-53 years of age. ? Every year if you are 52 years old or older. Eat a healthy diet   Eat a diet that includes plenty of vegetables, fruits, low-fat dairy products, and lean protein.  Do not eat a lot of foods that are high in solid fats, added sugars, or sodium. Get regular exercise Get regular exercise. This is one of the most important things you can do for your health. Most adults should:  Try to exercise for at least 150 minutes each week. The exercise should increase your heart rate and make you sweat (moderate-intensity exercise).  Try to do strengthening exercises at least twice each week. Do these in addition to the moderate-intensity exercise.  Spend less time sitting. Even light physical activity can be beneficial. Other tips  Work with your health care provider to achieve or maintain a healthy weight.  Do not use any products that contain nicotine or tobacco, such as cigarettes, e-cigarettes, and chewing tobacco. If you need help quitting, ask your health care provider.  Know your numbers. Ask your health care provider to check your cholesterol and your blood sugar (glucose). Continue to have your blood tested as directed by your health care provider. Do I need screening for cancer? Depending on your health history and family history, you may need to  have cancer screening at different stages of your life. This may include screening for:  Breast cancer.  Cervical cancer.  Lung cancer.  Colorectal cancer. What is my risk for osteoporosis? After menopause, you may be at increased risk for osteoporosis. Osteoporosis is a condition in which bone destruction happens more  quickly than new bone creation. To help prevent osteoporosis or the bone fractures that can happen because of osteoporosis, you may take the following actions:  If you are 62-24 years old, get at least 1,000 mg of calcium and at least 600 mg of vitamin D per day.  If you are older than age 49 but younger than age 46, get at least 1,200 mg of calcium and at least 600 mg of vitamin D per day.  If you are older than age 40, get at least 1,200 mg of calcium and at least 800 mg of vitamin D per day. Smoking and drinking excessive alcohol increase the risk of osteoporosis. Eat foods that are rich in calcium and vitamin D, and do weight-bearing exercises several times each week as directed by your health care provider. How does menopause affect my mental health? Depression may occur at any age, but it is more common as you become older. Common symptoms of depression include:  Low or sad mood.  Changes in sleep patterns.  Changes in appetite or eating patterns.  Feeling an overall lack of motivation or enjoyment of activities that you previously enjoyed.  Frequent crying spells. Talk with your health care provider if you think that you are experiencing depression. General instructions See your health care provider for regular wellness exams and vaccines. This may include:  Scheduling regular health, dental, and eye exams.  Getting and maintaining your vaccines. These include: ? Influenza vaccine. Get this vaccine each year before the flu season begins. ? Pneumonia vaccine. ? Shingles vaccine. ? Tetanus, diphtheria, and pertussis (Tdap) booster vaccine. Your health care provider may also recommend other immunizations. Tell your health care provider if you have ever been abused or do not feel safe at home. Summary  Menopause is a normal process in which your ability to get pregnant comes to an end.  This condition causes hot flashes, night sweats, decreased interest in sex, mood swings,  headaches, or lack of sleep.  Treatment for this condition may include hormone replacement therapy.  Take actions to keep yourself healthy, including exercising regularly, eating a healthy diet, watching your weight, and checking your blood pressure and blood sugar levels.  Get screened for cancer and depression. Make sure that you are up to date with all your vaccines. This information is not intended to replace advice given to you by your health care provider. Make sure you discuss any questions you have with your health care provider. Document Released: 06/20/2005 Document Revised: 04/21/2018 Document Reviewed: 04/21/2018 Elsevier Patient Education  2020 [employer].

## 2019-03-28 NOTE — Progress Notes (Signed)
Joanna Lambert Sep 01, 1955 [id number]    History:    Presents for annual exam.  Postmenopausal on Estring with no bleeding.  Uses the Estring every 2 months for migraine prevention which has helped.  Long-term history of migraines menstrual related and have persisted through menopause.  Normal Pap and mammogram history.  2018 benign colon polyp 5-year follow-up.  2020 T score -1.3 at spine normal at hip.  Labs managed by primary care.  Has had Shingrix and flu vaccine this year.  Past medical history, past surgical history, family history and social history were all reviewed and documented in the EPIC chart.  Retired, active lifestyle helps care for her grandchildren.  2 children both healthy.  ROS:  A ROS was performed and pertinent positives and negatives are included.  Exam:  Vitals:   03/28/19 0857  BP: 132/80  Weight: 136 lb (61.7 kg)  Height: 5\' 4"  (1.626 m)   Body mass index is 23.34 kg/m.   General appearance:  Normal Thyroid:  Symmetrical, normal in size, without palpable masses or nodularity. Respiratory  Auscultation:  Clear without wheezing or rhonchi Cardiovascular  Auscultation:  Regular rate, without rubs, murmurs or gallops  Edema/varicosities:  Not grossly evident Abdominal  Soft,nontender, without masses, guarding or rebound.  Liver/spleen:  No organomegaly noted  Hernia:  None appreciated  Skin  Inspection:  Grossly normal   Breasts: Examined lying and sitting.     Right: Without masses, retractions, discharge or axillary adenopathy.     Left: Without masses, retractions, discharge or axillary adenopathy. Gentitourinary   Inguinal/mons:  Normal without inguinal adenopathy  External genitalia:  Normal  BUS/Urethra/Skene's glands:  Normal  Vagina:  Normal  Cervix:  Normal  Uterus:  normal in size, shape and contour.  Midline and mobile  Adnexa/parametria:     Rt: Without masses or tenderness.   Lt: Without masses or tenderness.  Anus and  perineum: Normal  Digital rectal exam: Normal sphincter tone without palpated masses or tenderness  Assessment/Plan:  64 y.o. MWF G2 P2 for annual exam with no complaints.  Postmenopausal on Estring for migraine prevention Labs-primary care Osteopenia without elevated FRAX  Plan: Estring 2 mg per vagina every 2 months prescription, proper use given and reviewed minimal systemic absorption.  SBEs, continue annual 3D screening mammogram has scheduled.  Encouraged to continue active lifestyle, regular exercise, yoga.  Home safety, fall prevention discussed.  Vitamin D 2000 daily.  Pap normal 2019, new screening guidelines reviewed.    [place], 9:22 AM 03/28/2019

## 2019-04-19 ENCOUNTER — Ambulatory Visit (HOSPITAL_BASED_OUTPATIENT_CLINIC_OR_DEPARTMENT_OTHER)
Admission: RE | Admit: 2019-04-19 | Discharge: 2019-04-19 | Disposition: A | Discharge status: Home / Self Care | Payer: Federal, State, Local not specified - PPO | Source: Ambulatory Visit | Attending: Family | Admitting: Family

## 2019-04-19 ENCOUNTER — Other Ambulatory Visit: Payer: Self-pay

## 2019-04-19 ENCOUNTER — Encounter (HOSPITAL_BASED_OUTPATIENT_CLINIC_OR_DEPARTMENT_OTHER): Payer: Self-pay

## 2019-04-19 DIAGNOSIS — Z1231 Encounter for screening mammogram for malignant neoplasm of breast: Secondary | ICD-10-CM

## 2019-04-26 DIAGNOSIS — L820 Inflamed seborrheic keratosis: Secondary | ICD-10-CM | POA: Diagnosis not present

## 2019-04-26 DIAGNOSIS — L439 Lichen planus, unspecified: Secondary | ICD-10-CM | POA: Diagnosis not present

## 2019-04-26 DIAGNOSIS — D485 Neoplasm of uncertain behavior of skin: Secondary | ICD-10-CM | POA: Diagnosis not present

## 2019-04-26 DIAGNOSIS — D225 Melanocytic nevi of trunk: Secondary | ICD-10-CM | POA: Diagnosis not present

## 2019-04-26 DIAGNOSIS — L816 Other disorders of diminished melanin formation: Secondary | ICD-10-CM | POA: Diagnosis not present

## 2019-04-26 DIAGNOSIS — D229 Melanocytic nevi, unspecified: Secondary | ICD-10-CM | POA: Diagnosis not present

## 2019-04-26 DIAGNOSIS — D1801 Hemangioma of skin and subcutaneous tissue: Secondary | ICD-10-CM | POA: Diagnosis not present

## 2019-04-26 DIAGNOSIS — D2239 Melanocytic nevi of other parts of face: Secondary | ICD-10-CM | POA: Diagnosis not present

## 2019-05-30 ENCOUNTER — Other Ambulatory Visit: Payer: Self-pay | Admitting: Women's Health

## 2019-05-31 NOTE — Telephone Encounter (Signed)
Okay for refills with 5 refills

## 2019-07-08 ENCOUNTER — Encounter: Payer: Federal, State, Local not specified - PPO | Admitting: Family

## 2019-07-26 ENCOUNTER — Other Ambulatory Visit: Payer: Self-pay

## 2019-07-27 ENCOUNTER — Encounter: Payer: Self-pay | Admitting: Family

## 2019-07-27 ENCOUNTER — Other Ambulatory Visit: Payer: Self-pay

## 2019-07-27 ENCOUNTER — Telehealth: Payer: Self-pay | Admitting: Family

## 2019-07-27 ENCOUNTER — Ambulatory Visit (INDEPENDENT_AMBULATORY_CARE_PROVIDER_SITE_OTHER): Payer: Federal, State, Local not specified - PPO | Admitting: Family

## 2019-07-27 VITALS — BP 133/69 | HR 69 | Temp 96.3°F | Resp 16 | Ht 64.0 in | Wt 137.0 lb

## 2019-07-27 DIAGNOSIS — Z0000 Encounter for general adult medical examination without abnormal findings: Secondary | ICD-10-CM

## 2019-07-27 LAB — CBC WITH DIFFERENTIAL/PLATELET
Basophils Absolute: 0 K/uL (ref 0.0–0.1)
Basophils Relative: 0.5 % (ref 0.0–3.0)
Eosinophils Absolute: 0.1 K/uL (ref 0.0–0.7)
Eosinophils Relative: 2.6 % (ref 0.0–5.0)
HCT: 40.8 % (ref 36.0–46.0)
Hemoglobin: 13.8 g/dL (ref 12.0–15.0)
Lymphocytes Relative: 39.1 % (ref 12.0–46.0)
Lymphs Abs: 1.9 K/uL (ref 0.7–4.0)
MCHC: 33.7 g/dL (ref 30.0–36.0)
MCV: 91.2 fl (ref 78.0–100.0)
Monocytes Absolute: 0.5 K/uL (ref 0.1–1.0)
Monocytes Relative: 10.6 % (ref 3.0–12.0)
Neutro Abs: 2.2 K/uL (ref 1.4–7.7)
Neutrophils Relative %: 47.2 % (ref 43.0–77.0)
Platelets: 267 K/uL (ref 150.0–400.0)
RBC: 4.48 Mil/uL (ref 3.87–5.11)
RDW: 12.6 % (ref 11.5–15.5)
WBC: 4.7 K/uL (ref 4.0–10.5)

## 2019-07-27 LAB — BASIC METABOLIC PANEL WITH GFR
BUN: 14 mg/dL (ref 6–23)
CO2: 32 meq/L (ref 19–32)
Calcium: 9.6 mg/dL (ref 8.4–10.5)
Chloride: 100 meq/L (ref 96–112)
Creatinine, Ser: 0.8 mg/dL (ref 0.40–1.20)
GFR: 72.72 mL/min
Glucose, Bld: 86 mg/dL (ref 70–99)
Potassium: 4.3 meq/L (ref 3.5–5.1)
Sodium: 138 meq/L (ref 135–145)

## 2019-07-27 LAB — LIPID PANEL
Cholesterol: 226 mg/dL — ABNORMAL HIGH (ref 0–200)
HDL: 52.8 mg/dL (ref >39.00)
LDL Cholesterol: 154 mg/dL — ABNORMAL HIGH (ref 0–99)
NonHDL: 173.06
Total CHOL/HDL Ratio: 4
Triglycerides: 96 mg/dL (ref 0.0–149.0)
VLDL: 19.2 mg/dL (ref 0.0–40.0)

## 2019-07-27 LAB — HEPATIC FUNCTION PANEL
ALT: 16 U/L (ref 0–35)
AST: 18 U/L (ref 0–37)
Albumin: 4.4 g/dL (ref 3.5–5.2)
Alkaline Phosphatase: 87 U/L (ref 39–117)
Bilirubin, Direct: 0.1 mg/dL (ref 0.0–0.3)
Total Bilirubin: 0.7 mg/dL (ref 0.2–1.2)
Total Protein: 7 g/dL (ref 6.0–8.3)

## 2019-07-27 LAB — TSH: TSH: 2.72 u[IU]/mL (ref 0.35–4.50)

## 2019-07-27 MED ORDER — EPINEPHRINE 0.3 MG/0.3ML IJ SOAJ: 0.3000 mg | INTRAMUSCULAR | 1 refills | Status: DC | PRN

## 2019-07-27 NOTE — Patient Instructions (Signed)
Please complete lab work prior to leaving.    Preventive Care 40-62 Years Old, Female Preventive care refers to visits with your health care provider and lifestyle choices that can promote health and wellness. This includes:  A yearly physical exam. This may also be called an annual well check.  Regular dental visits and eye exams.  Immunizations.  Screening for certain conditions.  Healthy lifestyle choices, such as eating a healthy diet, getting regular exercise, not using drugs or products that contain nicotine and tobacco, and limiting alcohol use. What can I expect for my preventive care visit? Physical exam Your health care provider will check your:  Height and weight. This may be used to calculate body mass index (BMI), which tells if you are at a healthy weight.  Heart rate and blood pressure.  Skin for abnormal spots. Counseling Your health care provider may ask you questions about your:  Alcohol, tobacco, and drug use.  Emotional well-being.  Home and relationship well-being.  Sexual activity.  Eating habits.  Work and work environment.  Method of birth control.  Menstrual cycle.  Pregnancy history. What immunizations do I need?  Influenza (flu) vaccine  This is recommended every year. Tetanus, diphtheria, and pertussis (Tdap) vaccine  You may need a Td booster every 10 years. Varicella (chickenpox) vaccine  You may need this if you have not been vaccinated. Zoster (shingles) vaccine  You may need this after age 60. Measles, mumps, and rubella (MMR) vaccine  You may need at least one dose of MMR if you were born in 1957 or later. You may also need a second dose. Pneumococcal conjugate (PCV13) vaccine  You may need this if you have certain conditions and were not previously vaccinated. Pneumococcal polysaccharide (PPSV23) vaccine  You may need one or two doses if you smoke cigarettes or if you have certain conditions. Meningococcal conjugate  (MenACWY) vaccine  You may need this if you have certain conditions. Hepatitis A vaccine  You may need this if you have certain conditions or if you travel or work in places where you may be exposed to hepatitis A. Hepatitis B vaccine  You may need this if you have certain conditions or if you travel or work in places where you may be exposed to hepatitis B. Haemophilus influenzae type b (Hib) vaccine  You may need this if you have certain conditions. Human papillomavirus (HPV) vaccine  If recommended by your health care provider, you may need three doses over 6 months. You may receive vaccines as individual doses or as more than one vaccine together in one shot (combination vaccines). Talk with your health care provider about the risks and benefits of combination vaccines. What tests do I need? Blood tests  Lipid and cholesterol levels. These may be checked every 5 years, or more frequently if you are over 50 years old.  Hepatitis C test.  Hepatitis B test. Screening  Lung cancer screening. You may have this screening every year starting at age 55 if you have a 30-pack-year history of smoking and currently smoke or have quit within the past 15 years.  Colorectal cancer screening. All adults should have this screening starting at age 50 and continuing until age 75. Your health care provider may recommend screening at age 45 if you are at increased risk. You will have tests every 1-10 years, depending on your results and the type of screening test.  Diabetes screening. This is done by checking your blood sugar (glucose) after you have   not eaten for a while (fasting). You may have this done every 1-3 years.  Mammogram. This may be done every 1-2 years. Talk with your health care provider about when you should start having regular mammograms. This may depend on whether you have a family history of breast cancer.  BRCA-related cancer screening. This may be done if you have a family  history of breast, ovarian, tubal, or peritoneal cancers.  Pelvic exam and Pap test. This may be done every 3 years starting at age 21. Starting at age 30, this may be done every 5 years if you have a Pap test in combination with an HPV test. Other tests  Sexually transmitted disease (STD) testing.  Bone density scan. This is done to screen for osteoporosis. You may have this scan if you are at high risk for osteoporosis. Follow these instructions at home: Eating and drinking  Eat a diet that includes fresh fruits and vegetables, whole grains, lean protein, and low-fat dairy.  Take vitamin and mineral supplements as recommended by your health care provider.  Do not drink alcohol if: ? Your health care provider tells you not to drink. ? You are pregnant, may be pregnant, or are planning to become pregnant.  If you drink alcohol: ? Limit how much you have to 0-1 drink a day. ? Be aware of how much alcohol is in your drink. In the [place] one drink equals one 12 oz bottle of beer (355 mL), one 5 oz glass of wine (148 mL), or one 1 oz glass of hard liquor (44 mL). Lifestyle  Take daily care of your teeth and gums.  Stay active. Exercise for at least 30 minutes on 5 or more days each week.  Do not use any products that contain nicotine or tobacco, such as cigarettes, e-cigarettes, and chewing tobacco. If you need help quitting, ask your health care provider.  If you are sexually active, practice safe sex. Use a condom or other form of birth control (contraception) in order to prevent pregnancy and STIs (sexually transmitted infections).  If told by your health care provider, take low-dose aspirin daily starting at age 50. What's next?  Visit your health care provider once a year for a well check visit.  Ask your health care provider how often you should have your eyes and teeth checked.  Stay up to date on all vaccines. This information is not intended to replace advice given to you  by your health care provider. Make sure you discuss any questions you have with your health care provider. Document Revised: 01/07/2018 Document Reviewed: 01/07/2018 Elsevier Patient Education  2020 [employer]  

## 2019-07-27 NOTE — Telephone Encounter (Signed)
I left a PA form for protonix on your desk, can you please work on this?

## 2019-07-27 NOTE — Telephone Encounter (Signed)
Per insurance the current prior approval is still good until 09-17-2019. Will need to run a new one at that time.

## 2019-07-27 NOTE — Progress Notes (Signed)
Subjective:    Patient ID: Joanna Lambert, female    DOB: [birth year], 64 y.o.   MRN: [id number]  HPI  Patient presents today for complete physical.  Immunizations: tetanus 2013 Diet:  healthy Exercise: reports regular exercise (walking/yoga) Wt Readings from Last 3 Encounters:  07/27/19 137 lb (62.1 kg)  03/28/19 136 lb (61.7 kg)  05/18/18 131 lb (59.4 kg)  Colonoscopy: 2018- due 2023 Dexa: 05/25/18 Pap Smear: 03/23/18 Mammogram: 04/19/19 Vision: up to date Dental: up to date  BP Readings from Last 3 Encounters:  07/27/19 133/69  03/28/19 132/80  05/18/18 131/78       Review of Systems  Constitutional: Negative for unexpected weight change.  HENT: Negative for hearing loss and rhinorrhea.   Eyes: Negative for visual disturbance.  Respiratory: Negative for cough and shortness of breath.   Cardiovascular: Negative for chest pain.  Gastrointestinal: Negative for blood in stool, constipation, diarrhea and nausea.  Genitourinary: Negative for dysuria, frequency and hematuria.  Musculoskeletal: Negative for arthralgias and myalgias.  Skin: Negative for rash.  Neurological: Positive for headaches (gets about 3 migraines a month- uses rizatriptan which helps).  Hematological: Positive for adenopathy (reports chronic left cervical lymph node enlargement).  Psychiatric/Behavioral:       Denies depression/anxiety   Past Medical History:  Diagnosis Date  . Allergy    seasonal  . Migraines   . Ovarian cyst    right  . Restless legs   . Varicose veins   . Vitamin D deficiency 12/26/2014     Social History   Socioeconomic History  . Marital status: Married    Spouse name: Not on file  . Number of children: Not on file  . Years of education: Not on file  . Highest education level: Not on file  Occupational History  . Not on file  Tobacco Use  . Smoking status: Never Smoker  . Smokeless tobacco: Never Used  Substance and Sexual Activity  . Alcohol use: Yes   Comment: once a month per pt  . Drug use: No  . Sexual activity: Yes  Other Topics Concern  . 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     Social Determinants of Health   Financial Resource Strain:   . Difficulty of Paying Living Expenses:   Food Insecurity:   . Worried About Charity fundraiser in the Last Year:   . Arboriculturist in the Last Year:   Transportation Needs:   . Film/video editor (Medical):   Marland Kitchen Lack of Transportation (Non-Medical):   Physical Activity:   . Days of Exercise per Week:   . Minutes of Exercise per Session:   Stress:   . Feeling of Stress :   Social Connections:   . Frequency of Communication with Friends and Family:   . Frequency of Social Gatherings with Friends and Family:   . Attends Religious Services:   . Active Member of Clubs or Organizations:   . Attends Archivist Meetings:   Marland Kitchen Marital Status:   Intimate Partner Violence:   . Fear of Current or Ex-Partner:   . Emotionally Abused:   Marland Kitchen Physically Abused:   . Sexually Abused:     Past Surgical History:  Procedure Laterality Date  .  APPENDECTOMY    . BREAST BIOPSY    . BREAST SURGERY Right 2012   biopsy, benign cyst  . CARPAL TUNNEL RELEASE Bilateral 2010  . FOOT SURGERY Right 2011   bunion removal    Family History  Problem Relation Age of Onset  . Diabetes Mother   . Hypertension Mother 33  . Hyperlipidemia Mother   . Heart disease Mother        Aortic stenosis  . Heart disease Father 29       pacemaker  . Atrial fibrillation Father 36  . Peripheral vascular disease Father        s/p Carotid Edartarectomy  . Dementia Father   . Colon cancer Neg Hx     Allergies  Allergen Reactions  . Bee Venom     Local swelling  . Adhesive [Tape] Rash    Current Outpatient Medications  on File Prior to Visit  Medication Sig Dispense Refill  . aspirin 81 MG tablet Take 81 mg by mouth daily.    . calcium carbonate (OS-CAL - DOSED IN MG OF ELEMENTAL CALCIUM) 1250 (500 Ca) MG tablet Take by mouth.    . clotrimazole-betamethasone (LOTRISONE) cream APPLY EXTERNALLY TO THE AFFECTED AREA TWICE DAILY 45 g 1  . estradiol (ESTRING) 2 MG vaginal ring INSERT( REPLACE) 1 RING VAGINALLY EVERY 2 MONTHS 2 each 4  . metroNIDAZOLE (METROCREAM) 0.75 % cream Apply 1 application topically daily.  11  . pantoprazole (PROTONIX) 40 MG tablet Take 1 tablet (40 mg total) by mouth daily. 90 tablet 1  . rizatriptan (MAXALT) 10 MG tablet TAKE 1 TABLET BY MOUTH AS NEEDED. MAY REPEAT IN 2 HOURS IF NEEDED 10 tablet 5   Current Facility-Administered Medications on File Prior to Visit  Medication Dose Route Frequency Provider Last Rate Last Admin  . 0.9 %  sodium chloride infusion  500 mL Intravenous Continuous Nandigam, Kavitha V, MD        BP 133/69   Pulse 69   Temp (!) 96.3 F (35.7 C) (Temporal)   Resp 16   Ht 5\' 4"  (1.626 m)   Wt 137 lb (62.1 kg)   LMP 05/12/2013   SpO2 99%   BMI 23.52 kg/m       Objective:   Physical Exam Physical Exam  Constitutional: She is oriented to person, place, and time. She appears well-developed and well-nourished. No distress.  HENT:  Head: Normocephalic and atraumatic.  Right Ear: Tympanic membrane and ear canal normal.  Left Ear: Tympanic membrane and ear canal normal.  Mouth/Throat: Oropharynx is clear and moist.  Eyes: Pupils are equal, round, and reactive to light. No scleral icterus.  Neck: Normal range of motion. No thyromegaly present.  Cardiovascular: Normal rate and regular rhythm.   No murmur heard. Pulmonary/Chest: Effort normal and breath sounds normal. No respiratory distress. He has no wheezes. She has no rales. She exhibits no tenderness.  Abdominal: Soft. Bowel sounds are normal. She exhibits no distension and no mass. There is no  tenderness. There is no rebound and no guarding.  Musculoskeletal: She exhibits no edema.  Lymphadenopathy:    She has a palpable lymph node at top of left anterior cervical chain.  <1 cm, non-tender  Neurological: She is alert and oriented to person, place, and time. She has normal patellar reflexes. She exhibits normal muscle tone. Coordination normal.  Skin: Skin is warm and dry.  Psychiatric: She has a normal mood and affect. Her behavior is normal. Judgment and thought  content normal.  Breasts: Examined lying Right: Without masses, retractions, discharge or axillary adenopathy.  Left: Without masses, retractions, discharge or axillary adenopathy.  Pelvic: deferred         Assessment & Plan:   Preventative care- immunizations reviewed and up to date.  Obtain routine lab work. Mammo, pap, colo and dexa up to date.           Assessment & Plan:    This visit occurred during the SARS-CoV-2 public health emergency.  Safety protocols were in place, including screening questions prior to the visit, additional usage of staff PPE, and extensive cleaning of exam room while observing appropriate contact time as indicated for disinfecting solutions.

## 2019-11-09 ENCOUNTER — Other Ambulatory Visit: Payer: Self-pay

## 2019-11-09 ENCOUNTER — Encounter: Payer: Self-pay | Admitting: Family Medicine

## 2019-11-09 ENCOUNTER — Ambulatory Visit (INDEPENDENT_AMBULATORY_CARE_PROVIDER_SITE_OTHER): Payer: Federal, State, Local not specified - PPO | Admitting: Family Medicine

## 2019-11-09 VITALS — BP 124/86 | Ht 64.0 in | Wt 135.0 lb

## 2019-11-09 DIAGNOSIS — M25562 Pain in left knee: Secondary | ICD-10-CM

## 2019-11-09 NOTE — Progress Notes (Signed)
PCP: Debbrah Alar, NP  Subjective:   HPI: Patient is a 64 y.o. female here for left knee pain.  Patient denies known acute injury. She states about 1 month ago she started to get pain that felt deep inside knee, more in back and lateral. Has progressed to include pain medial posterior knee as well. Worse with sleeping, trying to extend the leg. Worse with prolonged walking. Was taking naproxen regularly without much benefit. No other medications for this. At times too hard to fully extend the knee.  Past Medical History:  Diagnosis Date  . Allergy    seasonal  . Migraines   . Ovarian cyst    right  . Restless legs   . Varicose veins   . Vitamin D deficiency 12/26/2014    Current Outpatient Medications on File Prior to Visit  Medication Sig Dispense Refill  . aspirin 81 MG tablet Take 81 mg by mouth daily.    . calcium carbonate (OS-CAL - DOSED IN MG OF ELEMENTAL CALCIUM) 1250 (500 Ca) MG tablet Take by mouth.    . clotrimazole-betamethasone (LOTRISONE) cream APPLY EXTERNALLY TO THE AFFECTED AREA TWICE DAILY 45 g 1  . EPINEPHrine (EPIPEN 2-PAK) 0.3 mg/0.3 mL IJ SOAJ injection Inject 0.3 mLs (0.3 mg total) into the muscle as needed for anaphylaxis. 1 each 1  . estradiol (ESTRING) 2 MG vaginal ring INSERT( REPLACE) 1 RING VAGINALLY EVERY 2 MONTHS 2 each 4  . metroNIDAZOLE (METROCREAM) 0.75 % cream Apply 1 application topically daily.  11  . pantoprazole (PROTONIX) 40 MG tablet Take 1 tablet (40 mg total) by mouth daily. 90 tablet 1  . rizatriptan (MAXALT) 10 MG tablet TAKE 1 TABLET BY MOUTH AS NEEDED. MAY REPEAT IN 2 HOURS IF NEEDED 10 tablet 5   Current Facility-Administered Medications on File Prior to Visit  Medication Dose Route Frequency Provider Last Rate Last Admin  . 0.9 %  sodium chloride infusion  500 mL Intravenous Continuous Nandigam, Venia Minks, MD        Past Surgical History:  Procedure Laterality Date  . APPENDECTOMY    . BREAST BIOPSY    . BREAST  SURGERY Right 2012   biopsy, benign cyst  . CARPAL TUNNEL RELEASE Bilateral 2010  . FOOT SURGERY Right 2011   bunion removal    Allergies  Allergen Reactions  . Bee Venom     Local swelling  . Adhesive [Tape] Rash    Social History   Socioeconomic History  . Marital status: Married    Spouse name: Not on file  . Number of children: Not on file  . Years of education: Not on file  . Highest education level: Not on file  Occupational History  . Not on file  Tobacco Use  . Smoking status: Never Smoker  . Smokeless tobacco: Never Used  Vaping Use  . Vaping Use: Never used  Substance and Sexual Activity  . Alcohol use: Yes    Comment: once a month per pt  . Drug use: No  . Sexual activity: Yes  Other Topics Concern  . 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     Social Determinants of Health   Financial Resource Strain:   . Difficulty of Paying Living Expenses:   Food Insecurity:   .  Worried About Charity fundraiser in the Last Year:   . Arboriculturist in the Last Year:   Transportation Needs:   . Film/video editor (Medical):   Marland Kitchen Lack of Transportation (Non-Medical):   Physical Activity:   . Days of Exercise per Week:   . Minutes of Exercise per Session:   Stress:   . Feeling of Stress :   Social Connections:   . Frequency of Communication with Friends and Family:   . Frequency of Social Gatherings with Friends and Family:   . Attends Religious Services:   . Active Member of Clubs or Organizations:   . Attends Archivist Meetings:   Marland Kitchen Marital Status:   Intimate Partner Violence:   . Fear of Current or Ex-Partner:   . Emotionally Abused:   Marland Kitchen Physically Abused:   . Sexually Abused:     Family History  Problem Relation Age of Onset  . Diabetes Mother   .  Hypertension Mother 63  . Hyperlipidemia Mother   . Heart disease Mother        Aortic stenosis  . Heart disease Father 88       pacemaker  . Atrial fibrillation Father 77  . Peripheral vascular disease Father        s/p Carotid Edartarectomy  . Dementia Father   . Colon cancer Neg Hx     BP 124/86   Ht 5\' 4"  (1.626 m)   Wt 135 lb (61.2 kg)   LMP 05/12/2013   BMI 23.17 kg/m   Review of Systems: See HPI above.     Objective:  Physical Exam:  Gen: NAD, comfortable in exam room  Left knee: Mild effusion.  Mild fullness posteromedial knee.  No other gross deformity, ecchymoses, swelling. Minimal lateral joint line tenderness.  No other tenderness. Full passive ROM but pain with full extension and flexion beyond 100 degrees. Negative ant/post drawers. Negative valgus/varus testing. Negative lachmans. Mild lateral pain with mcmurrays.  Negative thessalys, apleys, patellar apprehension. NV intact distally.   MSK u/s left knee: mild effusion.  Mod sized baker's cyst.  No obvious med-ant lateral meniscus tear.  Quad and patellar tendons intact.  Medial hamstring tendons without abnormalities.  Minimal arthropathy.  Assessment & Plan:  1. Left knee pain - consistent with flap type lateral meniscus tear with bakers cyst and effusion.  Discussed options.  Will start with physical therapy, icing, compression, naproxen.  Consider MRI, injection, drainage of baker's cyst if not improving.  F/u in 6 weeks.

## 2019-11-09 NOTE — Patient Instructions (Signed)
You have a lateral meniscus tear with a baker's cyst and knee effusion (swelling in the knee). Icing 15 minutes at a time 3-4 times a day. Start physical therapy and do home exercises on days you don't go to therapy. Compression sleeve can help with the swelling and motion during the day when you're up and walking around. Naproxen twice a day with food for pain and inflammation - take for 7 days then as needed. Consider steroid injection, drainage of baker's cyst, MRI if you're not improving as expected. Follow up in 6 weeks.

## 2019-11-28 ENCOUNTER — Ambulatory Visit: Payer: Federal, State, Local not specified - PPO | Attending: Family Medicine | Admitting: Physical Therapy

## 2019-11-28 ENCOUNTER — Other Ambulatory Visit: Payer: Self-pay

## 2019-11-28 DIAGNOSIS — M25662 Stiffness of left knee, not elsewhere classified: Secondary | ICD-10-CM | POA: Diagnosis not present

## 2019-11-28 DIAGNOSIS — M25562 Pain in left knee: Secondary | ICD-10-CM

## 2019-11-28 DIAGNOSIS — M6281 Muscle weakness (generalized): Secondary | ICD-10-CM | POA: Diagnosis not present

## 2019-11-28 DIAGNOSIS — R262 Difficulty in walking, not elsewhere classified: Secondary | ICD-10-CM

## 2019-11-28 NOTE — Therapy (Addendum)
[facility] [address]  [place], [place], [address] Phone: [phone]   Fax:  [phone]  Physical Therapy Evaluation  Patient Details  Name: Joanna Lambert MRN: [id number] Date of Birth: 1956/01/06 Referring Provider (PT): Karlton Lemon, MD   Encounter Date: 11/28/2019   PT End of Session - 11/28/19 1445    Visit Number 1    Number of Visits 12    Date for PT Re-Evaluation 01/09/20    Authorization Type Federal BCBS    PT Start Time 8527    PT Stop Time 1532    PT Time Calculation (min) 47 min    Activity Tolerance Patient tolerated treatment well    Behavior During Therapy [facility] for tasks assessed/performed           Past Medical History:  Diagnosis Date  . Allergy    seasonal  . Migraines   . Ovarian cyst    right  . Restless legs   . Varicose veins   . Vitamin D deficiency 12/26/2014    Past Surgical History:  Procedure Laterality Date  . APPENDECTOMY    . BREAST BIOPSY    . BREAST SURGERY Right 2012   biopsy, benign cyst  . CARPAL TUNNEL RELEASE Bilateral 2010  . FOOT SURGERY Right 2011   bunion removal    There were no vitals filed for this visit.    Subjective Assessment - 11/28/19 1447    Subjective Pt reports "crazy pain" in L knee starting 1.5 months ago w/o known MOI with swelling noted shortly thereafter. Per MD, pain consistent with flap type lateral meniscus tear with baker's cyst and effusion. While awaiting PT, she found home exercise program online which has helped significantly.    Limitations Walking    How long can you walk comfortably? forces herself to walk at times    Diagnostic tests MSK u/s left knee 11/09/19:  Mild effusion. Mod sized baker's cyst. No obvious med-ant lateral meniscus tear. Quad and patellar tendons intact. Medial hamstring tendons without abnormalities. Minimal arthropathy.    Patient Stated Goals "full ROM back"    Currently in Pain? Yes    Pain Score  4     Pain Location Knee    Pain Orientation Left;Lateral;Posterior    Pain Descriptors / Indicators Tightness;Jabbing    Pain Type Acute pain    Pain Radiating Towards rarely into shin    Pain Onset More than a month ago    Pain Frequency Intermittent    Aggravating Factors  walking fast, hyperextending knee    Pain Relieving Factors ice, elevation    Effect of Pain on Daily Activities difficulty getting up/down from floor              [facility] PT Assessment - 11/28/19 1445      Assessment   Medical Diagnosis L knee pain    Referring Provider (PT) Karlton Lemon, MD    Onset Date/Surgical Date --   ~1.5 month   Next MD Visit 12/19/19    Prior Therapy none for current condition      Precautions   Precautions None      Balance Screen   Has the patient fallen in the past 6 months No    Has the patient had a decrease in activity level because of a fear of falling?  No    Is the patient reluctant to leave their home because of a fear of falling?  No  [place] Private residence    Living Arrangements Spouse/significant other    Type of [place] Access Level entry    [place] One level      Prior Function   Level of Independence Independent    Vocation Retired    Leisure watches 2 grandsons (6 & 71 y/o); walking daily (~7000 steps); yoga & pilates (not attempted since knee has been bothering her)      Cognition   Overall Cognitive Status Within Functional Limits for tasks assessed      ROM / Strength   AROM / PROM / Strength AROM;PROM;Strength      AROM   AROM Assessment Site Knee    Right/Left Knee Right;Left    Right Knee Extension -2    Right Knee Flexion 148    Left Knee Extension 2    Left Knee Flexion 132   tight/painful after 120~     PROM   PROM Assessment Site Knee    Right/Left Knee Left    Left Knee Extension 0    Left Knee Flexion 140      Strength   Strength Assessment Site Hip;Knee;Ankle    Right/Left  Hip Right;Left    Right Hip Flexion 4-/5    Right Hip Extension 4+/5    Right Hip External Rotation  4/5    Right Hip Internal Rotation 4+/5    Right Hip ABduction 4+/5    Right Hip ADduction 4+/5    Left Hip Flexion 4-/5    Left Hip Extension 4/5    Left Hip External Rotation 4/5    Left Hip Internal Rotation 4+/5    Left Hip ABduction 4-/5    Left Hip ADduction 4/5    Right/Left Knee Right;Left    Right Knee Flexion 5/5    Right Knee Extension 5/5    Left Knee Flexion 4-/5   pain   Left Knee Extension 4+/5    Right/Left Ankle Right;Left    Right Ankle Dorsiflexion 4+/5    Right Ankle Plantar Flexion 5/5    Left Ankle Dorsiflexion 4+/5    Left Ankle Plantar Flexion 4/5      Flexibility   Soft Tissue Assessment /Muscle Length yes    Hamstrings mild tight L    Quadriceps mild tight L    ITB mild tight L    Piriformis WNL      Palpation   Patella mobility WNL    Palpation comment ttp over distal lateral HS, popliteus and proximal gastroc                      Objective measurements completed on examination: See above findings.       [facility] Adult PT Treatment/Exercise - 11/28/19 1532      Exercises   Exercises Knee/Hip      Knee/Hip Exercises: Stretches   Passive Hamstring Stretch Left;30 seconds;2 reps    Passive Hamstring Stretch Limitations supine with strap + DF for gastroc stretch    ITB Stretch Left;30 seconds;1 rep    ITB Stretch Limitations supine cross-body with strap    Gastroc Stretch Left;30 seconds;1 rep    Soleus Stretch Left;30 seconds;1 rep      Knee/Hip Exercises: Supine   Quad Sets Left;10 reps   5" hold   Quad Sets Limitations towel roll under knee    Straight Leg Raises Left;10 reps;AROM;Strengthening    Straight Leg Raises Limitations cues  for quad set prior to initiation of lift                  PT Education - 11/28/19 1532    Education Details PT eval findings, anticipated POC and initial HEP    Person(s) Educated  Patient    Methods Explanation;Demonstration;Handout    Comprehension Verbalized understanding;Returned demonstration;Need further instruction            PT Short Term Goals - 11/28/19 1532      PT SHORT TERM GOAL #1   Title Patient will be independent with initial HEP    Status New    Target Date 12/12/19             PT Long Term Goals - 11/28/19 1532      PT LONG TERM GOAL #1   Title Patient will be independent with ongoing/advanced HEP    Status New    Target Date 01/09/20      PT LONG TERM GOAL #2   Title Patient to improve L knee AROM to WNL without pain provocation    Status New    Target Date 01/09/20      PT LONG TERM GOAL #3   Title Patient will demonstrate improved L proximal LE strength to >/= 4+/5 for improved stability and ease of mobility    Status New    Target Date 01/09/20      PT LONG TERM GOAL #4   Title Patient will complete floor to stand transfer w/o limitation due to L knee pain, LOM or weakness    Status New    Target Date 01/09/20      PT LONG TERM GOAL #5   Title Patient will resume normal exercise routine inlcuding walking, yoga and pilates w/o limitation due to L knee pain    Status New    Target Date 01/09/20                  Plan - 11/28/19 1532    Clinical Impression Statement Joanna Lambert is a 64 y/o female who presents to OP PT for acute L knee pain of ~1.5 months duration w/o known MOI but consistent with flap type lateral meniscus tear with baker's cyst and effusion. Deficits include lateral joint line and posterior L knee pain with ttp over distal lateral HS, popliteus and proximal gastroc, decreased L knee flexion ROM and end range L knee extension, mild quad, HS, ITB and gastroc tightness, and mild proximal LE strength. Pain and lack of motion make it difficult for her to get up and down from the floor to play with her grandchildren whom she watches on a regular basis. Joanna Lambert will benefit from skilled PT intervention to address  the above listed deficits, reduce pain, and restore functional ROM and strength to allow ability to return to normal daily activities without pain interference.    Personal Factors and Comorbidities Time since onset of injury/illness/exacerbation;Comorbidity 3+    Comorbidities Chronic venous insufficiency, migraines, GERD, IBS, B CTR, R foot bunionectomy    Examination-Activity Limitations Bend;Caring for Others;Locomotion Level;Squat;Transfers    Examination-Participation Restrictions Cleaning;Community Activity;Shop    Stability/Clinical Decision Making Stable/Uncomplicated    Clinical Decision Making Low    Rehab Potential Good    PT Frequency 2x / week    PT Duration 6 weeks    PT Treatment/Interventions ADLs/Self Care Home Management;Cryotherapy;Electrical Stimulation;Iontophoresis 4mg /ml Dexamethasone;Moist Heat;Ultrasound;Gait training;Stair training;Functional mobility training;Therapeutic activities;Therapeutic exercise;Balance training;Neuromuscular re-education;Patient/family education;Manual techniques;Passive range of motion;Dry needling;Taping;Vasopneumatic Device;Joint  Manipulations    PT Next Visit Plan Knee FOTO; review initial HEP; L knee ROM; proximal LE strengthening; manual therapy & modalities PRN    PT Home Exercise Plan 7/19 - gastroc, HS & ITB stretches, quad set & SLR    Consulted and Agree with Plan of Care Patient           Patient will benefit from skilled therapeutic intervention in order to improve the following deficits and impairments:  Abnormal gait, Decreased activity tolerance, Decreased balance, Decreased endurance, Decreased mobility, Decreased range of motion, Decreased strength, Difficulty walking, Increased edema, Increased fascial restricitons, Increased muscle spasms, Impaired perceived functional ability, Impaired flexibility, Pain  Visit Diagnosis: Acute pain of left knee - Plan: PT plan of care cert/re-cert  Stiffness of left knee, not elsewhere  classified - Plan: PT plan of care cert/re-cert  Muscle weakness (generalized) - Plan: PT plan of care cert/re-cert  Difficulty in walking, not elsewhere classified - Plan: PT plan of care cert/re-cert     Problem List Patient Active Problem List   Diagnosis Date Noted  . Bee allergy status 08/06/2016  . Right knee pain 09/27/2015  . Trigger finger, acquired 01/16/2015  . Preventative health care 12/26/2014  . Vitamin D deficiency 12/26/2014  . Chronic venous insufficiency 09/05/2014  . Migraines 09/05/2014  . Ovarian cyst 09/05/2014  . Irritable bowel syndrome with constipation 10/13/2011  . Varicose veins 02/12/2011  . Allergic rhinitis, seasonal 02/12/2011  . GERD (gastroesophageal reflux disease) 02/12/2011    Percival Spanish, PT, MPT 11/28/2019, 7:56 PM  [facility] [address]  [place], [place], [address] Phone: [phone]   Fax:  [phone]  Name: Joanna Lambert MRN: [id number] Date of Birth: Sep 12, 1955

## 2019-11-28 NOTE — Patient Instructions (Signed)
    Home exercise program created by Yan Pankratz, PT.  For questions, please contact Germaine Shenker via phone at [phone] or email at [email or web address]  [facility] [place] [address]  [address] [place], [place], [address] Phone: [phone]   Fax:  [phone]    

## 2019-12-05 ENCOUNTER — Other Ambulatory Visit: Payer: Self-pay | Admitting: *Deleted

## 2019-12-05 MED ORDER — RIZATRIPTAN BENZOATE 10 MG PO TABS: ORAL_TABLET | ORAL | 3 refills | Status: DC

## 2019-12-06 ENCOUNTER — Other Ambulatory Visit: Payer: Self-pay

## 2019-12-06 ENCOUNTER — Ambulatory Visit: Payer: Federal, State, Local not specified - PPO

## 2019-12-06 DIAGNOSIS — R262 Difficulty in walking, not elsewhere classified: Secondary | ICD-10-CM | POA: Diagnosis not present

## 2019-12-06 DIAGNOSIS — M25662 Stiffness of left knee, not elsewhere classified: Secondary | ICD-10-CM

## 2019-12-06 DIAGNOSIS — M6281 Muscle weakness (generalized): Secondary | ICD-10-CM | POA: Diagnosis not present

## 2019-12-06 DIAGNOSIS — M25562 Pain in left knee: Secondary | ICD-10-CM

## 2019-12-06 NOTE — Therapy (Signed)
[facility] [address]  [place], [place], [address] Phone: [phone]   Fax:  [phone]  Physical Therapy Treatment  Patient Details  Name: Layne Lebon MRN: [id number] Date of Birth: 16-Apr-1956 Referring Provider (PT): Karlton Lemon, MD   Encounter Date: 12/06/2019   PT End of Session - 12/06/19 1507    Visit Number 2    Number of Visits 12    Date for PT Re-Evaluation 01/09/20    Authorization Type Federal BCBS    PT Start Time 2694    PT Stop Time 1530    PT Time Calculation (min) 43 min    Activity Tolerance Patient tolerated treatment well    Behavior During Therapy [facility] for tasks assessed/performed           Past Medical History:  Diagnosis Date  . Allergy    seasonal  . Migraines   . Ovarian cyst    right  . Restless legs   . Varicose veins   . Vitamin D deficiency 12/26/2014    Past Surgical History:  Procedure Laterality Date  . APPENDECTOMY    . BREAST BIOPSY    . BREAST SURGERY Right 2012   biopsy, benign cyst  . CARPAL TUNNEL RELEASE Bilateral 2010  . FOOT SURGERY Right 2011   bunion removal    There were no vitals filed for this visit.   Subjective Assessment - 12/06/19 1453    Subjective Pt. doing ok.  Went to the [employer].    How long can you walk comfortably? forces herself to walk at times    Diagnostic tests MSK u/s left knee 11/09/19:  Mild effusion. Mod sized baker's cyst. No obvious med-ant lateral meniscus tear. Quad and patellar tendons intact. Medial hamstring tendons without abnormalities. Minimal arthropathy.    Patient Stated Goals "full ROM back"    Currently in Pain? No/denies    Pain Score 0-No pain    Pain Location Knee    Pain Orientation Left              [facility] PT Assessment - 12/06/19 0001      Assessment   Medical Diagnosis L knee pain    Referring Provider (PT) Karlton Lemon, MD    Next MD Visit 12/19/19    Prior Therapy none for current  condition                         [facility] Adult PT Treatment/Exercise - 12/06/19 0001      Knee/Hip Exercises: Stretches   Passive Hamstring Stretch Left;30 seconds;2 reps    Passive Hamstring Stretch Limitations supine with strap + DF for gastroc stretch    Hip Flexor Stretch Left;1 rep;30 seconds    Hip Flexor Stretch Limitations mod thomas pos    ITB Stretch Left;30 seconds;1 rep    ITB Stretch Limitations supine with strap     Gastroc Stretch Left;30 seconds;1 rep    Gastroc Stretch Limitations slight L knee bend to relieve posterior knee pain    Soleus Stretch Left;30 seconds;1 rep      Knee/Hip Exercises: Aerobic   Recumbent Bike lvl 1, 6 min       Knee/Hip Exercises: Standing   Terminal Knee Extension Left;10 reps;Strengthening    Theraband Level (Terminal Knee Extension) Level 4 (Blue)    Terminal Knee Extension Limitations cues for isolated knee motion     Other Standing Knee Exercises side  stepping with yellow TB at ankles 2 x 20 ft       Knee/Hip Exercises: Supine   Quad Sets Left;10 reps    Quad Sets Limitations 5" hold; pillow under knee     Straight Leg Raises Left;15 reps;Strengthening    Straight Leg Raises Limitations min cues for quad set required                  PT Education - 12/06/19 1640    Education Details HEP update;  stepping with yellow TB at ankles, blue TB TKE    Person(s) Educated Patient    Methods Explanation;Demonstration;Verbal cues;Handout    Comprehension Verbalized understanding;Returned demonstration;Verbal cues required            PT Short Term Goals - 12/06/19 1508      PT SHORT TERM GOAL #1   Title Patient will be independent with initial HEP    Status On-going    Target Date 12/12/19             PT Long Term Goals - 12/06/19 1508      PT LONG TERM GOAL #1   Title Patient will be independent with ongoing/advanced HEP    Status On-going      PT LONG TERM GOAL #2   Title Patient to improve L  knee AROM to WNL without pain provocation    Status On-going      PT LONG TERM GOAL #3   Title Patient will demonstrate improved L proximal LE strength to >/= 4+/5 for improved stability and ease of mobility    Status On-going      PT LONG TERM GOAL #4   Title Patient will complete floor to stand transfer w/o limitation due to L knee pain, LOM or weakness    Status On-going      PT LONG TERM GOAL #5   Title Patient will resume normal exercise routine inlcuding walking, yoga and pilates w/o limitation due to L knee pain    Status On-going                 Plan - 12/06/19 1645    Clinical Impression Statement Kenndra doing well.  Only pain she noted today was with end ROM extension in supine with TKE/quad set in posterior knee relieved with rest.  Had good overall understanding and technique with HEP.  Patient very attentive to detail and gave good effort throughout session with therex.  Introduced side stepping with yellow TB resistance at ankles and standing blue TB resisted L TKE.  Pt. requiring occasional cueing to isolate quad focused strengthening however good carryover.  Ended visit pain free.  HEP updated (see pt. education section).  Will monitor tolerance to updated HEP next session and continue LE strengthening per pt. tolerance.    Comorbidities Chronic venous insufficiency, migraines, GERD, IBS, B CTR, R foot bunionectomy    Rehab Potential Good    PT Treatment/Interventions ADLs/Self Care Home Management;Cryotherapy;Electrical Stimulation;Iontophoresis 4mg /ml Dexamethasone;Moist Heat;Ultrasound;Gait training;Stair training;Functional mobility training;Therapeutic activities;Therapeutic exercise;Balance training;Neuromuscular re-education;Patient/family education;Manual techniques;Passive range of motion;Dry needling;Taping;Vasopneumatic Device;Joint Manipulations    PT Home Exercise Plan 7/19 - gastroc, HS & ITB stretches, quad set & SLR: 07/27 - side stepping with yellow  looped band at ankle, standing blue TB TKE    Consulted and Agree with Plan of Care Patient           Patient will benefit from skilled therapeutic intervention in order to improve the following deficits and impairments:  Abnormal gait, Decreased activity tolerance, Decreased balance, Decreased endurance, Decreased mobility, Decreased range of motion, Decreased strength, Difficulty walking, Increased edema, Increased fascial restricitons, Increased muscle spasms, Impaired perceived functional ability, Impaired flexibility, Pain  Visit Diagnosis: Acute pain of left knee  Stiffness of left knee, not elsewhere classified  Muscle weakness (generalized)  Difficulty in walking, not elsewhere classified     Problem List Patient Active Problem List   Diagnosis Date Noted  . Bee allergy status 08/06/2016  . Right knee pain 09/27/2015  . Trigger finger, acquired 01/16/2015  . Preventative health care 12/26/2014  . Vitamin D deficiency 12/26/2014  . Chronic venous insufficiency 09/05/2014  . Migraines 09/05/2014  . Ovarian cyst 09/05/2014  . Irritable bowel syndrome with constipation 10/13/2011  . Varicose veins 02/12/2011  . Allergic rhinitis, seasonal 02/12/2011  . GERD (gastroesophageal reflux disease) 02/12/2011    Bess Harvest, PTA 12/06/19 4:54 PM   [facility] [place] [address]  [place], [place], [address] Phone: [phone]   Fax:  [phone]  Name: Darl Brisbin MRN: [id number] Date of Birth: 10-04-1955

## 2019-12-08 ENCOUNTER — Ambulatory Visit: Payer: Federal, State, Local not specified - PPO

## 2019-12-08 ENCOUNTER — Other Ambulatory Visit: Payer: Self-pay

## 2019-12-08 DIAGNOSIS — M25562 Pain in left knee: Secondary | ICD-10-CM | POA: Diagnosis not present

## 2019-12-08 DIAGNOSIS — M25662 Stiffness of left knee, not elsewhere classified: Secondary | ICD-10-CM | POA: Diagnosis not present

## 2019-12-08 DIAGNOSIS — R262 Difficulty in walking, not elsewhere classified: Secondary | ICD-10-CM | POA: Diagnosis not present

## 2019-12-08 DIAGNOSIS — M6281 Muscle weakness (generalized): Secondary | ICD-10-CM

## 2019-12-08 NOTE — Therapy (Signed)
[facility] [address]  [place], [place], [address] Phone: [phone]   Fax:  [phone]  Physical Therapy Treatment  Patient Details  Name: Joanna Lambert MRN: [id number] Date of Birth: 04/30/1956 Referring Provider (PT): Karlton Lemon, MD   Encounter Date: 12/08/2019   PT End of Session - 12/08/19 1459    Visit Number 3    Number of Visits 12    Date for PT Re-Evaluation 01/09/20    Authorization Type Federal BCBS    PT Start Time 1287    PT Stop Time 1530    PT Time Calculation (min) 43 min    Activity Tolerance Patient tolerated treatment well    Behavior During Therapy [facility] for tasks assessed/performed           Past Medical History:  Diagnosis Date  . Allergy    seasonal  . Migraines   . Ovarian cyst    right  . Restless legs   . Varicose veins   . Vitamin D deficiency 12/26/2014    Past Surgical History:  Procedure Laterality Date  . APPENDECTOMY    . BREAST BIOPSY    . BREAST SURGERY Right 2012   biopsy, benign cyst  . CARPAL TUNNEL RELEASE Bilateral 2010  . FOOT SURGERY Right 2011   bunion removal    There were no vitals filed for this visit.   Subjective Assessment - 12/08/19 1450    Subjective Denies soreness after last session.    How long can you walk comfortably? forces herself to walk at times    Diagnostic tests MSK u/s left knee 11/09/19:  Mild effusion. Mod sized baker's cyst. No obvious med-ant lateral meniscus tear. Quad and patellar tendons intact. Medial hamstring tendons without abnormalities. Minimal arthropathy.    Patient Stated Goals "full ROM back"    Currently in Pain? No/denies    Pain Score 0-No pain    Pain Location Knee                             [facility] Adult PT Treatment/Exercise - 12/08/19 0001      Neuro Re-ed    Neuro Re-ed Details  Alternating LE cone knock over/righting standing on airex pad x 10       Knee/Hip Exercises:  Stretches   Press photographer Left;30 seconds;2 reps    Gastroc Stretch Limitations slight L knee bend to relieve posterior knee pain      Knee/Hip Exercises: Aerobic   Recumbent Bike lvl 1, 6 min       Knee/Hip Exercises: Machines for Strengthening   Cybex Leg Press B LEs; 25# x 15 reps       Knee/Hip Exercises: Standing   Terminal Knee Extension Left;Strengthening;15 reps    Theraband Level (Terminal Knee Extension) Level 4 (Blue)    Forward Step Up Right;Left;10 reps;Step Height: 8";Hand Hold: 1    Forward Step Up Limitations cues for slow eccentric motion     Functional Squat 10 reps;3 seconds    Functional Squat Limitations TRX cable     SLS with Vectors B red TB pallof press in SLS 2 x 10       Knee/Hip Exercises: Sidelying   Clams B clam shells with red TB at knees x 10                    PT Short Term Goals - 12/06/19  [place] #1   Title Patient will be independent with initial HEP    Status On-going    Target Date 12/12/19             PT Long Term Goals - 12/06/19 1508      PT LONG TERM GOAL #1   Title Patient will be independent with ongoing/advanced HEP    Status On-going      PT LONG TERM GOAL #2   Title Patient to improve L knee AROM to WNL without pain provocation    Status On-going      PT LONG TERM GOAL #3   Title Patient will demonstrate improved L proximal LE strength to >/= 4+/5 for improved stability and ease of mobility    Status On-going      PT LONG TERM GOAL #4   Title Patient will complete floor to stand transfer w/o limitation due to L knee pain, LOM or weakness    Status On-going      PT LONG TERM GOAL #5   Title Patient will resume normal exercise routine inlcuding walking, yoga and pilates w/o limitation due to L knee pain    Status On-going                 Plan - 12/08/19 1507    Clinical Impression Statement Joanna Lambert denies soreness after last session.  Does still reporting occasional  posterior knee pain with TKE in session and K-taping briefly discussed however pt. noting she has an adhesive allergy thus deferred K-taping.  Joanna Lambert tolerated all hip/quad strengthening activities today well without lingering pain and ended visit noting "I can tell I worked the muscles".    Comorbidities Chronic venous insufficiency, migraines, GERD, IBS, B CTR, R foot bunionectomy    Rehab Potential Good    PT Treatment/Interventions ADLs/Self Care Home Management;Cryotherapy;Electrical Stimulation;Iontophoresis 4mg /ml Dexamethasone;Moist Heat;Ultrasound;Gait training;Stair training;Functional mobility training;Therapeutic activities;Therapeutic exercise;Balance training;Neuromuscular re-education;Patient/family education;Manual techniques;Passive range of motion;Dry needling;Taping;Vasopneumatic Device;Joint Manipulations    PT Next Visit Plan L knee ROM; proximal LE strengthening; manual therapy & modalities PRN    PT Home Exercise Plan 7/19 - gastroc, HS & ITB stretches, quad set & SLR: 07/27 - side stepping with yellow looped band at ankle, standing blue TB TKE    Consulted and Agree with Plan of Care Patient           Patient will benefit from skilled therapeutic intervention in order to improve the following deficits and impairments:  Abnormal gait, Decreased activity tolerance, Decreased balance, Decreased endurance, Decreased mobility, Decreased range of motion, Decreased strength, Difficulty walking, Increased edema, Increased fascial restricitons, Increased muscle spasms, Impaired perceived functional ability, Impaired flexibility, Pain  Visit Diagnosis: Acute pain of left knee  Stiffness of left knee, not elsewhere classified  Muscle weakness (generalized)  Difficulty in walking, not elsewhere classified     Problem List Patient Active Problem List   Diagnosis Date Noted  . Bee allergy status 08/06/2016  . Right knee pain 09/27/2015  . Trigger finger, acquired 01/16/2015   . Preventative health care 12/26/2014  . Vitamin D deficiency 12/26/2014  . Chronic venous insufficiency 09/05/2014  . Migraines 09/05/2014  . Ovarian cyst 09/05/2014  . Irritable bowel syndrome with constipation 10/13/2011  . Varicose veins 02/12/2011  . Allergic rhinitis, seasonal 02/12/2011  . GERD (gastroesophageal reflux disease) 02/12/2011    Bess Harvest, PTA 12/08/19 [facility] [phone]  [address]  [place], [place], [address] Phone: [phone]   Fax:  [phone]  Name: Joanna Lambert MRN: [id number] Date of Birth: Mar 02, 1956

## 2019-12-13 ENCOUNTER — Encounter: Payer: Self-pay | Admitting: Physical Therapy

## 2019-12-13 ENCOUNTER — Ambulatory Visit: Payer: Federal, State, Local not specified - PPO | Attending: Family Medicine | Admitting: Physical Therapy

## 2019-12-13 ENCOUNTER — Other Ambulatory Visit: Payer: Self-pay

## 2019-12-13 DIAGNOSIS — M25662 Stiffness of left knee, not elsewhere classified: Secondary | ICD-10-CM | POA: Diagnosis not present

## 2019-12-13 DIAGNOSIS — M25562 Pain in left knee: Secondary | ICD-10-CM

## 2019-12-13 DIAGNOSIS — M6281 Muscle weakness (generalized): Secondary | ICD-10-CM | POA: Diagnosis not present

## 2019-12-13 DIAGNOSIS — R262 Difficulty in walking, not elsewhere classified: Secondary | ICD-10-CM | POA: Diagnosis not present

## 2019-12-13 NOTE — Therapy (Signed)
[facility] [address]  [place], [place], [address] Phone: [phone]   Fax:  [phone]  Physical Therapy Treatment  Patient Details  Name: Joanna Lambert MRN: [id number] Date of Birth: 10-Mar-1956 Referring Provider (PT): Karlton Lemon, MD   Encounter Date: 12/13/2019   PT End of Session - 12/13/19 1449    Visit Number 4    Number of Visits 12    Date for PT Re-Evaluation 01/09/20    Authorization Type Federal BCBS    PT Start Time 4128    PT Stop Time 1532    PT Time Calculation (min) 43 min    Activity Tolerance Patient tolerated treatment well    Behavior During Therapy [facility] for tasks assessed/performed           Past Medical History:  Diagnosis Date  . Allergy    seasonal  . Migraines   . Ovarian cyst    right  . Restless legs   . Varicose veins   . Vitamin D deficiency 12/26/2014    Past Surgical History:  Procedure Laterality Date  . APPENDECTOMY    . BREAST BIOPSY    . BREAST SURGERY Right 2012   biopsy, benign cyst  . CARPAL TUNNEL RELEASE Bilateral 2010  . FOOT SURGERY Right 2011   bunion removal    There were no vitals filed for this visit.   Subjective Assessment - 12/13/19 1451    Subjective Doing well - no pain currently. Notes improvement in reciprocal stair negotiation but still has some difficulty with getting up/down from floor. Also notes sense of instability while sleeping.    Diagnostic tests MSK u/s left knee 11/09/19:  Mild effusion. Mod sized baker's cyst. No obvious med-ant lateral meniscus tear. Quad and patellar tendons intact. Medial hamstring tendons without abnormalities. Minimal arthropathy.    Patient Stated Goals "full ROM back"    Currently in Pain? No/denies                             [facility] Adult PT Treatment/Exercise - 12/13/19 1449      Knee/Hip Exercises: Aerobic   Recumbent Bike L2 x 6 min      Knee/Hip Exercises: Standing   Hip  Flexion Left;Right;10 reps;Stengthening;Knee straight    Hip Flexion Limitations red TB at ankle - 1 pole A for balance    Hip ADduction Left;Right;10 reps;Strengthening    Hip ADduction Limitations red TB at ankle - 1 pole A for balance    Hip Abduction Left;Right;10 reps;Stengthening;Knee straight    Abduction Limitations red TB at ankle - 1 pole A for balance    Hip Extension Left;Right;10 reps;Stengthening;Knee straight    Extension Limitations red TB at ankle - 1 pole A for balance    Forward Step Up Left;15 reps;Step Height: 8";Hand Hold: 1    Forward Step Up Limitations + blue TB TKE    Step Down Left;10 reps;Step Height: 6";Hand Hold: 2    Step Down Limitations lateral eccentric lowering with light heel touch    SLS L/R SLS + RDL reach to mat table x 10      Knee/Hip Exercises: Supine   Bridges Both;10 reps;Strengthening    Bridges Limitations + red TB hip ABD isometric    Other Supine Knee/Hip Exercises Straight leg bridge with heels on peanut ball 10 x 3"    Other Supine Knee/Hip Exercises Bridge + HS curl  on peanut ball x 10      Knee/Hip Exercises: Sidelying   Other Sidelying Knee/Hip Exercises R/L side plank elbow to knee 10 x 3"                    PT Short Term Goals - 12/13/19 1455      PT SHORT TERM GOAL #1   Title Patient will be independent with initial HEP    Status Achieved   12/13/19            PT Long Term Goals - 12/13/19 1455      PT LONG TERM GOAL #1   Title Patient will be independent with ongoing/advanced HEP    Status On-going    Target Date 01/09/20      PT LONG TERM GOAL #2   Title Patient to improve L knee AROM to WNL without pain provocation    Status On-going    Target Date 01/09/20      PT LONG TERM GOAL #3   Title Patient will demonstrate improved L proximal LE strength to >/= 4+/5 for improved stability and ease of mobility    Status On-going    Target Date 01/09/20      PT LONG TERM GOAL #4   Title Patient will  complete floor to stand transfer w/o limitation due to L knee pain, LOM or weakness    Status On-going    Target Date 01/09/20      PT LONG TERM GOAL #5   Title Patient will resume normal exercise routine inlcuding walking, yoga and pilates w/o limitation due to L knee pain    Status Partially Met   12/13/19 - has kept up with walking but has not tries yoga or pilates   Target Date 01/09/20                 Plan - 12/13/19 1538    Clinical Impression Statement Joanna Lambert reports her pain has been much better allowing for improved walking tolerance and better reciprocal stair negotiation, but still notes difficulty with floor transfers and has a sense of instability in her knee while sleeping. Initial HEP going well with no concerns noted - STG met. Continued strengthening with emphasis on proximal and lateral stability along with eccentric control and balance/proprioception with good tolerance for most exercises only noting stretching sensation in L posterior knee during straight leg bridges.    Comorbidities Chronic venous insufficiency, migraines, GERD, IBS, B CTR, R foot bunionectomy    Rehab Potential Good    PT Frequency 2x / week    PT Duration 6 weeks    PT Treatment/Interventions ADLs/Self Care Home Management;Cryotherapy;Electrical Stimulation;Iontophoresis 49m/ml Dexamethasone;Moist Heat;Ultrasound;Gait training;Stair training;Functional mobility training;Therapeutic activities;Therapeutic exercise;Balance training;Neuromuscular re-education;Patient/family education;Manual techniques;Passive range of motion;Dry needling;Taping;Vasopneumatic Device;Joint Manipulations    PT Next Visit Plan L knee ROM; proximal LE strengthening; manual therapy & modalities PRN    PT Home Exercise Plan 7/19 - gastroc, HS & ITB stretches, quad set & SLR: 7/27 - side stepping with yellow looped band at ankle, standing blue TB TKE    Consulted and Agree with Plan of Care Patient           Patient will  benefit from skilled therapeutic intervention in order to improve the following deficits and impairments:  Abnormal gait, Decreased activity tolerance, Decreased balance, Decreased endurance, Decreased mobility, Decreased range of motion, Decreased strength, Difficulty walking, Increased edema, Increased fascial restricitons, Increased muscle spasms, Impaired perceived functional ability, Impaired  flexibility, Pain  Visit Diagnosis: Acute pain of left knee  Stiffness of left knee, not elsewhere classified  Muscle weakness (generalized)  Difficulty in walking, not elsewhere classified     Problem List Patient Active Problem List   Diagnosis Date Noted  . Bee allergy status 08/06/2016  . Right knee pain 09/27/2015  . Trigger finger, acquired 01/16/2015  . Preventative health care 12/26/2014  . Vitamin D deficiency 12/26/2014  . Chronic venous insufficiency 09/05/2014  . Migraines 09/05/2014  . Ovarian cyst 09/05/2014  . Irritable bowel syndrome with constipation 10/13/2011  . Varicose veins 02/12/2011  . Allergic rhinitis, seasonal 02/12/2011  . GERD (gastroesophageal reflux disease) 02/12/2011    Percival Spanish, PT, MPT 12/13/2019, 3:39 PM  [facility] [address]  [place], [place], [address] Phone: [phone]   Fax:  [phone]  Name: Joanna Lambert MRN: [id number] Date of Birth: 1955/10/30

## 2019-12-15 ENCOUNTER — Encounter: Payer: Federal, State, Local not specified - PPO | Admitting: Physical Therapy

## 2019-12-15 ENCOUNTER — Ambulatory Visit: Payer: Federal, State, Local not specified - PPO

## 2019-12-19 ENCOUNTER — Other Ambulatory Visit: Payer: Self-pay

## 2019-12-19 ENCOUNTER — Ambulatory Visit (INDEPENDENT_AMBULATORY_CARE_PROVIDER_SITE_OTHER): Payer: Federal, State, Local not specified - PPO | Admitting: Family Medicine

## 2019-12-19 VITALS — BP 122/78 | Ht 64.0 in | Wt 135.0 lb

## 2019-12-19 DIAGNOSIS — M25562 Pain in left knee: Secondary | ICD-10-CM

## 2019-12-19 NOTE — Progress Notes (Signed)
PCP: Debbrah Alar, NP  Subjective:   HPI: Patient is a 64 y.o. female here for follow-up of left knee pain. She says her left knee has improved a lot, about 80% back to normal. She states she no longer feels tenderness on the outside of the knee, but still feels a pain or tightness deep in the center of the knee. The knee only really bothers her at night when she has her leg extended since she sleeps on her back. She states it is no longer swelling as much and she is now able to extend it fully, although she feels it is tight when she tries to flex her knee. Denies numbness. States she has a baseline level of tingling in her lower legs, but it is no different from usual. She has been doing PT twice a week and does her exercises every day. She occasionally wears the compression sleeve but states it makes the tingling in her lower leg worse.  Past Medical History:  Diagnosis Date  . Allergy    seasonal  . Migraines   . Ovarian cyst    right  . Restless legs   . Varicose veins   . Vitamin D deficiency 12/26/2014    Current Outpatient Medications on File Prior to Visit  Medication Sig Dispense Refill  . aspirin 81 MG tablet Take 81 mg by mouth daily.    . calcium carbonate (OS-CAL - DOSED IN MG OF ELEMENTAL CALCIUM) 1250 (500 Ca) MG tablet Take by mouth.    . clotrimazole-betamethasone (LOTRISONE) cream APPLY EXTERNALLY TO THE AFFECTED AREA TWICE DAILY 45 g 1  . EPINEPHrine (EPIPEN 2-PAK) 0.3 mg/0.3 mL IJ SOAJ injection Inject 0.3 mLs (0.3 mg total) into the muscle as needed for anaphylaxis. 1 each 1  . estradiol (ESTRING) 2 MG vaginal ring INSERT( REPLACE) 1 RING VAGINALLY EVERY 2 MONTHS 2 each 4  . metroNIDAZOLE (METROCREAM) 0.75 % cream Apply 1 application topically daily.  11  . pantoprazole (PROTONIX) 40 MG tablet Take 1 tablet (40 mg total) by mouth daily. 90 tablet 1  . rizatriptan (MAXALT) 10 MG tablet May repeat in 2 hours if needed 10 tablet 3   Current Facility-Administered  Medications on File Prior to Visit  Medication Dose Route Frequency Provider Last Rate Last Admin  . 0.9 %  sodium chloride infusion  500 mL Intravenous Continuous Nandigam, Venia Minks, MD        Past Surgical History:  Procedure Laterality Date  . APPENDECTOMY    . BREAST BIOPSY    . BREAST SURGERY Right 2012   biopsy, benign cyst  . CARPAL TUNNEL RELEASE Bilateral 2010  . FOOT SURGERY Right 2011   bunion removal    Allergies  Allergen Reactions  . Bee Venom     Local swelling  . Adhesive [Tape] Rash    Social History   Socioeconomic History  . Marital status: Married    Spouse name: Not on file  . Number of children: Not on file  . Years of education: Not on file  . Highest education level: Not on file  Occupational History  . Not on file  Tobacco Use  . Smoking status: Never Smoker  . Smokeless tobacco: Never Used  Vaping Use  . Vaping Use: Never used  Substance and Sexual Activity  . Alcohol use: Yes    Comment: once a month per pt  . Drug use: No  . Sexual activity: Yes  Other Topics Concern  . Not on  file  Social History Narrative   Married   2 children (daughter lives here- she has two sons)   Son lives in [place]   Retired from Teaching laboratory technician (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     Social Determinants of Health   Financial Resource Strain:   . Difficulty of Paying Living Expenses:   Food Insecurity:   . Worried About Charity fundraiser in the Last Year:   . Arboriculturist in the Last Year:   Transportation Needs:   . Film/video editor (Medical):   Marland Kitchen Lack of Transportation (Non-Medical):   Physical Activity:   . Days of Exercise per Week:   . Minutes of Exercise per Session:   Stress:   . Feeling of Stress :   Social Connections:   . Frequency of Communication with Friends and Family:   . Frequency of Social Gatherings with Friends and Family:   . Attends Religious Services:   . Active Member of Clubs or  Organizations:   . Attends Archivist Meetings:   Marland Kitchen Marital Status:   Intimate Partner Violence:   . Fear of Current or Ex-Partner:   . Emotionally Abused:   Marland Kitchen Physically Abused:   . Sexually Abused:     Family History  Problem Relation Age of Onset  . Diabetes Mother   . Hypertension Mother 58  . Hyperlipidemia Mother   . Heart disease Mother        Aortic stenosis  . Heart disease Father 59       pacemaker  . Atrial fibrillation Father 65  . Peripheral vascular disease Father        s/p Carotid Edartarectomy  . Dementia Father   . Colon cancer Neg Hx     BP 122/78   Ht 5\' 4"  (1.626 m)   Wt 135 lb (61.2 kg)   LMP 05/12/2013   BMI 23.17 kg/m   Review of Systems: See HPI above.     Objective:  Physical Exam:  Gen: NAD, comfortable in exam room Resp: Breathing comfortably at rest. Left Knee: -No visible swelling or bruising on inspection -No tenderness to palpation of knee, including the medial and lateral joint line. Baker's cyst palpable on posteromedial aspect of left knee.  -FROM but discomfort with full flexion -Full strength flexion and extension -Negative anterior/posterior drawer, negative varus/valgus stress testing, negative McMurray's, some tightness felt with Thessaly's -Neurovascularly intact distally   Assessment & Plan:  1. Left Knee Pain Pain and ROM have been improving with conservative treatment. The swelling and Baker's cyst have gone down significantly. No need for MRI, injection, or drainage currently. Plan to continue with physical therapy once a week, icing as needed, and naproxen as needed.  Follow up as needed.

## 2019-12-20 ENCOUNTER — Encounter: Payer: Self-pay | Admitting: Physical Therapy

## 2019-12-20 ENCOUNTER — Encounter: Payer: Self-pay | Admitting: Family Medicine

## 2019-12-20 ENCOUNTER — Ambulatory Visit: Payer: Federal, State, Local not specified - PPO | Admitting: Physical Therapy

## 2019-12-20 DIAGNOSIS — R262 Difficulty in walking, not elsewhere classified: Secondary | ICD-10-CM | POA: Diagnosis not present

## 2019-12-20 DIAGNOSIS — M25662 Stiffness of left knee, not elsewhere classified: Secondary | ICD-10-CM

## 2019-12-20 DIAGNOSIS — M25562 Pain in left knee: Secondary | ICD-10-CM

## 2019-12-20 DIAGNOSIS — M6281 Muscle weakness (generalized): Secondary | ICD-10-CM

## 2019-12-20 NOTE — Therapy (Signed)
[facility] [place] [address]  [place], [place], [address] Phone: [phone]   Fax:  [phone]  Physical Therapy Treatment  Patient Details  Name: Joanna Lambert MRN: [id number] Date of Birth: 01-Sep-1955 Referring Provider (PT): Karlton Lemon, MD   Encounter Date: 12/20/2019   PT End of Session - 12/20/19 1534    Visit Number 5    Number of Visits 12    Date for PT Re-Evaluation 01/09/20    Authorization Type Federal BCBS    PT Start Time 3614    PT Stop Time 1615    PT Time Calculation (min) 41 min    Activity Tolerance Patient tolerated treatment well    Behavior During Therapy [facility] for tasks assessed/performed           Past Medical History:  Diagnosis Date  . Allergy    seasonal  . Migraines   . Ovarian cyst    right  . Restless legs   . Varicose veins   . Vitamin D deficiency 12/26/2014    Past Surgical History:  Procedure Laterality Date  . APPENDECTOMY    . BREAST BIOPSY    . BREAST SURGERY Right 2012   biopsy, benign cyst  . CARPAL TUNNEL RELEASE Bilateral 2010  . FOOT SURGERY Right 2011   bunion removal    There were no vitals filed for this visit.   Subjective Assessment - 12/20/19 1541    Subjective Feels like stairs are getting a little better.    Diagnostic tests MSK u/s left knee 11/09/19:  Mild effusion. Mod sized baker's cyst. No obvious med-ant lateral meniscus tear. Quad and patellar tendons intact. Medial hamstring tendons without abnormalities. Minimal arthropathy.    Patient Stated Goals "full ROM back"    Currently in Pain? No/denies              [facility] PT Assessment - 12/20/19 1534      Assessment   Medical Diagnosis L knee pain    Referring Provider (PT) Karlton Lemon, MD    Next MD Visit PRN                         [facility] Adult PT Treatment/Exercise - 12/20/19 1534      Exercises   Exercises Knee/Hip      Knee/Hip Exercises: Aerobic   Recumbent  Bike L2 x 6 min      Knee/Hip Exercises: Standing   Hip Abduction Right;Left;10 reps;Stengthening    Abduction Limitations Fitter (1 black) - 2 pole A for balance    Hip Extension Right;Left;10 reps;Stengthening    Extension Limitations Fitter (1 black) - 2 pole A for balance    Step Down Left;10 reps;Step Height: 6";Hand Hold: 2    Step Down Limitations fwd eccentric lowering with light heel touch    Rocker Board Limitations Lateral weight shift, heel-toe weight shift, mini-squats x 15 each; intermittent 2 pole A for balance    Other Standing Knee Exercises B side stepping & fwd/back monster walk with looped red TB at ankles 2 x 25 ft      Knee/Hip Exercises: Supine   Other Supine Knee/Hip Exercises Straight leg bridge with heels on green Pball 10 x 3"    Other Supine Knee/Hip Exercises Bridge + HS curl on gren Pball x 10                  PT Education - 12/20/19  1615    Education Details HEP update - side stepping progressed to red TB with monster walk added, fwd eccentric step-downs, straight leg bridge & bridge + HS curl on 65 cm Pball    Person(s) Educated Patient    Methods Explanation;Demonstration;Handout    Comprehension Verbalized understanding;Returned demonstration;Need further instruction            PT Short Term Goals - 12/13/19 1455      PT SHORT TERM GOAL #1   Title Patient will be independent with initial HEP    Status Achieved   12/13/19            PT Long Term Goals - 12/13/19 1455      PT LONG TERM GOAL #1   Title Patient will be independent with ongoing/advanced HEP    Status On-going    Target Date 01/09/20      PT LONG TERM GOAL #2   Title Patient to improve L knee AROM to WNL without pain provocation    Status On-going    Target Date 01/09/20      PT LONG TERM GOAL #3   Title Patient will demonstrate improved L proximal LE strength to >/= 4+/5 for improved stability and ease of mobility    Status On-going    Target Date 01/09/20        PT LONG TERM GOAL #4   Title Patient will complete floor to stand transfer w/o limitation due to L knee pain, LOM or weakness    Status On-going    Target Date 01/09/20      PT LONG TERM GOAL #5   Title Patient will resume normal exercise routine inlcuding walking, yoga and pilates w/o limitation due to L knee pain    Status Partially Met   12/13/19 - has kept up with walking but has not tries yoga or pilates   Target Date 01/09/20                 Plan - 12/20/19 1615    Clinical Impression Statement Joanna Lambert reports MD pleased with her progress with PT and does not feel further medical intervention indicated at this time, but wants her to continue with PT. She reports HEP is going well and she has added lateral eccentric step-downs performed during last PT session - states that stairs seem to be getting easier. Continued strengthening progression further targeting proximal and lateral stability along with eccentric control and balance/proprioception with less favoring of L LE noted.    Comorbidities Chronic venous insufficiency, migraines, GERD, IBS, B CTR, R foot bunionectomy    Rehab Potential Good    PT Frequency 2x / week    PT Duration 6 weeks    PT Treatment/Interventions ADLs/Self Care Home Management;Cryotherapy;Electrical Stimulation;Iontophoresis 13m/ml Dexamethasone;Moist Heat;Ultrasound;Gait training;Stair training;Functional mobility training;Therapeutic activities;Therapeutic exercise;Balance training;Neuromuscular re-education;Patient/family education;Manual techniques;Passive range of motion;Dry needling;Taping;Vasopneumatic Device;Joint Manipulations    PT Next Visit Plan L knee ROM; proximal LE strengthening; manual therapy & modalities PRN    PT Home Exercise Plan 7/19 - gastroc, HS & ITB stretches, quad set & SLR; 7/27 - standing blue TB TKE; 8/10 - red TB side steppiing & monster walk, fwd eccentric step-down, straight leg bridge +/- HS curl on Pball    Consulted and  Agree with Plan of Care Patient           Patient will benefit from skilled therapeutic intervention in order to improve the following deficits and impairments:  Abnormal gait, Decreased activity tolerance,  Decreased balance, Decreased endurance, Decreased mobility, Decreased range of motion, Decreased strength, Difficulty walking, Increased edema, Increased fascial restricitons, Increased muscle spasms, Impaired perceived functional ability, Impaired flexibility, Pain  Visit Diagnosis: Acute pain of left knee  Stiffness of left knee, not elsewhere classified  Muscle weakness (generalized)  Difficulty in walking, not elsewhere classified     Problem List Patient Active Problem List   Diagnosis Date Noted  . Bee allergy status 08/06/2016  . Right knee pain 09/27/2015  . Trigger finger, acquired 01/16/2015  . Preventative health care 12/26/2014  . Vitamin D deficiency 12/26/2014  . Chronic venous insufficiency 09/05/2014  . Migraines 09/05/2014  . Ovarian cyst 09/05/2014  . Irritable bowel syndrome with constipation 10/13/2011  . Varicose veins 02/12/2011  . Allergic rhinitis, seasonal 02/12/2011  . GERD (gastroesophageal reflux disease) 02/12/2011    Percival Spanish, PT, MPT 12/20/2019, 6:28 PM  [facility] [address]  [place], [place], [address] Phone: [phone]   Fax:  [phone]  Name: Joanna Lambert MRN: [id number] Date of Birth: 1955-10-17

## 2019-12-20 NOTE — Patient Instructions (Signed)
    Home exercise program created by Lameka Disla, PT.  For questions, please contact Brynnlee Cumpian via phone at [phone] or email at [email or web address]  [facility] [place] [address]  [address] [place], [place], [address] Phone: [phone]   Fax:  [phone]    

## 2019-12-22 ENCOUNTER — Ambulatory Visit: Payer: Federal, State, Local not specified - PPO

## 2019-12-22 ENCOUNTER — Other Ambulatory Visit: Payer: Self-pay

## 2019-12-22 DIAGNOSIS — M25662 Stiffness of left knee, not elsewhere classified: Secondary | ICD-10-CM

## 2019-12-22 DIAGNOSIS — M6281 Muscle weakness (generalized): Secondary | ICD-10-CM

## 2019-12-22 DIAGNOSIS — M25562 Pain in left knee: Secondary | ICD-10-CM | POA: Diagnosis not present

## 2019-12-22 DIAGNOSIS — R262 Difficulty in walking, not elsewhere classified: Secondary | ICD-10-CM | POA: Diagnosis not present

## 2019-12-22 NOTE — Therapy (Signed)
[facility] [place] [address]  [place], [place], [address] Phone: [phone]   Fax:  [phone]  Physical Therapy Treatment  Patient Details  Name: Joanna Lambert MRN: [id number] Date of Birth: [birth year] Referring Provider (PT): Karlton Lemon, MD   Encounter Date: 12/22/2019   PT End of Session - 12/22/19 1451    Visit Number 6    Number of Visits 12    Date for PT Re-Evaluation 01/09/20    Authorization Type Federal BCBS    PT Start Time 5009    PT Stop Time 1527    PT Time Calculation (min) 44 min    Activity Tolerance Patient tolerated treatment well    Behavior During Therapy [facility] for tasks assessed/performed           Past Medical History:  Diagnosis Date  . Allergy    seasonal  . Migraines   . Ovarian cyst    right  . Restless legs   . Varicose veins   . Vitamin D deficiency 12/26/2014    Past Surgical History:  Procedure Laterality Date  . APPENDECTOMY    . BREAST BIOPSY    . BREAST SURGERY Right 2012   biopsy, benign cyst  . CARPAL TUNNEL RELEASE Bilateral 2010  . FOOT SURGERY Right 2011   bunion removal    There were no vitals filed for this visit.   Subjective Assessment - 12/22/19 1449    Subjective No issues with HEP.    How long can you walk comfortably? forces herself to walk at times    Diagnostic tests MSK u/s left knee 11/09/19:  Mild effusion. Mod sized baker's cyst. No obvious med-ant lateral meniscus tear. Quad and patellar tendons intact. Medial hamstring tendons without abnormalities. Minimal arthropathy.    Patient Stated Goals "full ROM back"    Currently in Pain? No/denies    Pain Score 0-No pain    Pain Location Knee    Pain Orientation Left    Multiple Pain Sites No                             [facility] Adult PT Treatment/Exercise - 12/22/19 0001      Knee/Hip Exercises: Aerobic   Tread Mill Lvl 2.0, 6 min       Knee/Hip Exercises: Machines for  Strengthening   Cybex Leg Press L only 20# x 15 reps       Knee/Hip Exercises: Standing   Forward Lunges Right;Left;10 reps    Forward Lunges Limitations "Mini" lunge on TRX    Step Down Left;10 reps;Step Height: 6";Hand Hold: 1    Step Down Limitations lateral eccentric step-down     Functional Squat 10 reps;3 seconds    Functional Squat Limitations TRX cable to wooden box + airex pad     Wall Squat 10 reps;5 seconds    Wall Squat Limitations depth to pt. tolerance       Knee/Hip Exercises: Supine   Bridges Both;Right;Left    Bridges Limitations bridge + alternating LAQ x 1 each time     Straight Leg Raises Left;15 reps;Strengthening    Straight Leg Raises Limitations 2#    Other Supine Knee/Hip Exercises Bridge 5" x 10 reps                  PT Education - 12/22/19 1533    Education Details HEP update; bridge    Person(s)  Educated Patient    Methods Explanation;Demonstration;Verbal cues;Handout    Comprehension Verbalized understanding;Returned demonstration;Verbal cues required            PT Short Term Goals - 12/13/19 1455      PT SHORT TERM GOAL #1   Title Patient will be independent with initial HEP    Status Achieved   12/13/19            PT Long Term Goals - 12/13/19 1455      PT LONG TERM GOAL #1   Title Patient will be independent with ongoing/advanced HEP    Status On-going    Target Date 01/09/20      PT LONG TERM GOAL #2   Title Patient to improve L knee AROM to WNL without pain provocation    Status On-going    Target Date 01/09/20      PT LONG TERM GOAL #3   Title Patient will demonstrate improved L proximal LE strength to >/= 4+/5 for improved stability and ease of mobility    Status On-going    Target Date 01/09/20      PT LONG TERM GOAL #4   Title Patient will complete floor to stand transfer w/o limitation due to L knee pain, LOM or weakness    Status On-going    Target Date 01/09/20      PT LONG TERM GOAL #5   Title Patient  will resume normal exercise routine inlcuding walking, yoga and pilates w/o limitation due to L knee pain    Status Partially Met   12/13/19 - has kept up with walking but has not tries yoga or pilates   Target Date 01/09/20                 Plan - 12/22/19 1455    Clinical Impression Statement Joanna Lambert reporting no issues with latest HEP update and reports, "my knee is feeling good today".  Able to demonstrate improved L quad control with lateral step-down today and verbalizing good adherence to HEP.  Progressed to green p-ball wall sit and BOSU ball stability + ball toss with pain and with only mild instability noted.    Comorbidities Chronic venous insufficiency, migraines, GERD, IBS, B CTR, R foot bunionectomy    Rehab Potential Good    PT Frequency 2x / week    PT Treatment/Interventions ADLs/Self Care Home Management;Cryotherapy;Electrical Stimulation;Iontophoresis 48m/ml Dexamethasone;Moist Heat;Ultrasound;Gait training;Stair training;Functional mobility training;Therapeutic activities;Therapeutic exercise;Balance training;Neuromuscular re-education;Patient/family education;Manual techniques;Passive range of motion;Dry needling;Taping;Vasopneumatic Device;Joint Manipulations    PT Next Visit Plan L knee ROM; proximal LE strengthening; manual therapy & modalities PRN    PT Home Exercise Plan 7/19 - gastroc, HS & ITB stretches, quad set & SLR; 7/27 - standing blue TB TKE; 8/10 - red TB side steppiing & monster walk, fwd eccentric step-down, straight leg bridge +/- HS curl on Pball    Consulted and Agree with Plan of Care Patient           Patient will benefit from skilled therapeutic intervention in order to improve the following deficits and impairments:  Abnormal gait, Decreased activity tolerance, Decreased balance, Decreased endurance, Decreased mobility, Decreased range of motion, Decreased strength, Difficulty walking, Increased edema, Increased fascial restricitons, Increased muscle  spasms, Impaired perceived functional ability, Impaired flexibility, Pain  Visit Diagnosis: Acute pain of left knee  Stiffness of left knee, not elsewhere classified  Muscle weakness (generalized)  Difficulty in walking, not elsewhere classified     Problem List Patient Active Problem  List   Diagnosis Date Noted  . Bee allergy status 08/06/2016  . Right knee pain 09/27/2015  . Trigger finger, acquired 01/16/2015  . Preventative health care 12/26/2014  . Vitamin D deficiency 12/26/2014  . Chronic venous insufficiency 09/05/2014  . Migraines 09/05/2014  . Ovarian cyst 09/05/2014  . Irritable bowel syndrome with constipation 10/13/2011  . Varicose veins 02/12/2011  . Allergic rhinitis, seasonal 02/12/2011  . GERD (gastroesophageal reflux disease) 02/12/2011    Bess Harvest, PTA 12/22/19 3:42 PM   [facility] [place] [address]  [place], [place], [address] Phone: [phone]   Fax:  [phone]  Name: Joanna Lambert MRN: [id number] Date of Birth: 06/04/1955

## 2019-12-27 ENCOUNTER — Ambulatory Visit: Payer: Federal, State, Local not specified - PPO

## 2019-12-27 ENCOUNTER — Other Ambulatory Visit: Payer: Self-pay

## 2019-12-27 DIAGNOSIS — R262 Difficulty in walking, not elsewhere classified: Secondary | ICD-10-CM

## 2019-12-27 DIAGNOSIS — M25662 Stiffness of left knee, not elsewhere classified: Secondary | ICD-10-CM | POA: Diagnosis not present

## 2019-12-27 DIAGNOSIS — M25562 Pain in left knee: Secondary | ICD-10-CM | POA: Diagnosis not present

## 2019-12-27 DIAGNOSIS — M6281 Muscle weakness (generalized): Secondary | ICD-10-CM

## 2019-12-27 NOTE — Therapy (Signed)
[facility] [place] [address]  [place], [place], [address] Phone: [phone]   Fax:  [phone]  Physical Therapy Treatment  Patient Details  Name: Joanna Lambert MRN: [id number] Date of Birth: 1955-08-18 Referring Provider (PT): Karlton Lemon, MD   Encounter Date: 12/27/2019   PT End of Session - 12/27/19 1456    Visit Number 7    Number of Visits 12    Date for PT Re-Evaluation 01/09/20    Authorization Type Federal BCBS    PT Start Time 1856    PT Stop Time 1530    PT Time Calculation (min) 41 min    Activity Tolerance Patient tolerated treatment well    Behavior During Therapy [facility] for tasks assessed/performed           Past Medical History:  Diagnosis Date  . Allergy    seasonal  . Migraines   . Ovarian cyst    right  . Restless legs   . Varicose veins   . Vitamin D deficiency 12/26/2014    Past Surgical History:  Procedure Laterality Date  . APPENDECTOMY    . BREAST BIOPSY    . BREAST SURGERY Right 2012   biopsy, benign cyst  . CARPAL TUNNEL RELEASE Bilateral 2010  . FOOT SURGERY Right 2011   bunion removal    There were no vitals filed for this visit.   Subjective Assessment - 12/27/19 1455    Subjective Doing well with no new complaints.    Diagnostic tests MSK u/s left knee 11/09/19:  Mild effusion. Mod sized baker's cyst. No obvious med-ant lateral meniscus tear. Quad and patellar tendons intact. Medial hamstring tendons without abnormalities. Minimal arthropathy.    Patient Stated Goals "full ROM back"    Currently in Pain? No/denies    Pain Score 0-No pain    Pain Location Knee    Pain Orientation Left    Multiple Pain Sites No              [facility] PT Assessment - 12/27/19 0001      AROM   Right/Left Knee Left    Left Knee Extension 0    Left Knee Flexion 148                         [facility] Adult PT Treatment/Exercise - 12/27/19 0001      Knee/Hip Exercises:  Stretches   Passive Hamstring Stretch Left;30 seconds;1 rep    Passive Hamstring Stretch Limitations supine     Quad Stretch Left;1 rep;30 seconds    Quad Stretch Limitations prone with bolster under L thigh       Knee/Hip Exercises: Aerobic   Recumbent Bike L2 x 6 min      Knee/Hip Exercises: Machines for Strengthening   Cybex Knee Flexion B LEs: 30# x 15 reps      Knee/Hip Exercises: Standing   Heel Raises Both;20 reps    Heel Raises Limitations counter     Forward Lunges Right;Left;10 reps    Forward Lunges Limitations TRX mod lunge     Step Down Left;10 reps;2 sets;Step Height: 6";Hand Hold: 2    Step Down Limitations lateral eccentric step-down     Other Standing Knee Exercises Standing BOSU ball + eyes closed x 30 sec; head turns x 30 sec ; close therapist support  PT Education - 12/27/19 1539    Education Details HEP update; wall squats    Person(s) Educated Patient    Methods Explanation;Demonstration;Verbal cues;Handout    Comprehension Verbalized understanding;Returned demonstration;Verbal cues required            PT Short Term Goals - 12/13/19 1455      PT SHORT TERM GOAL #1   Title Patient will be independent with initial HEP    Status Achieved   12/13/19            PT Long Term Goals - 12/27/19 1503      PT LONG TERM GOAL #1   Title Patient will be independent with ongoing/advanced HEP    Status Partially Met      PT LONG TERM GOAL #2   Title Patient to improve L knee AROM to WNL without pain provocation    Status Achieved      PT LONG TERM GOAL #3   Title Patient will demonstrate improved L proximal LE strength to >/= 4+/5 for improved stability and ease of mobility    Status On-going      PT LONG TERM GOAL #4   Title Patient will complete floor to stand transfer w/o limitation due to L knee pain, LOM or weakness    Status Achieved      PT LONG TERM GOAL #5   Title Patient will resume normal exercise routine inlcuding  walking, yoga and pilates w/o limitation due to L knee pain    Status Partially Met   12/13/19 - has kept up with walking but has not tries yoga or pilates                Plan - 12/27/19 1457    Clinical Impression Statement Pt. denies recent pain.  Was able to demo L knee AROM WFL 0-148 dg today achieving LTG #2.  Pt. able to complete floor >stand transfer without limitation due to LOM, knee pain or weakness.  LTG #4 met.  Progressed quad, HS strengthening along with proximal hip strengthening and proprioception training with pain.  Ended visit with pt. noting LE fatigue.    Comorbidities Chronic venous insufficiency, migraines, GERD, IBS, B CTR, R foot bunionectomy    Rehab Potential Good    PT Frequency 2x / week    PT Duration 6 weeks    PT Treatment/Interventions ADLs/Self Care Home Management;Cryotherapy;Electrical Stimulation;Iontophoresis 103m/ml Dexamethasone;Moist Heat;Ultrasound;Gait training;Stair training;Functional mobility training;Therapeutic activities;Therapeutic exercise;Balance training;Neuromuscular re-education;Patient/family education;Manual techniques;Passive range of motion;Dry needling;Taping;Vasopneumatic Device;Joint Manipulations    PT Next Visit Plan L knee ROM; proximal LE strengthening; manual therapy & modalities PRN    PT Home Exercise Plan 7/19 - gastroc, HS & ITB stretches, quad set & SLR; 7/27 - standing blue TB TKE; 8/10 - red TB side steppiing & monster walk, fwd eccentric step-down, straight leg bridge +/- HS curl on Pball; 08/17 - wall squats    Consulted and Agree with Plan of Care Patient           Patient will benefit from skilled therapeutic intervention in order to improve the following deficits and impairments:  Abnormal gait, Decreased activity tolerance, Decreased balance, Decreased endurance, Decreased mobility, Decreased range of motion, Decreased strength, Difficulty walking, Increased edema, Increased fascial restricitons, Increased muscle  spasms, Impaired perceived functional ability, Impaired flexibility, Pain  Visit Diagnosis: Acute pain of left knee  Stiffness of left knee, not elsewhere classified  Muscle weakness (generalized)  Difficulty in walking, not elsewhere classified  Problem List Patient Active Problem List   Diagnosis Date Noted  . Bee allergy status 08/06/2016  . Right knee pain 09/27/2015  . Trigger finger, acquired 01/16/2015  . Preventative health care 12/26/2014  . Vitamin D deficiency 12/26/2014  . Chronic venous insufficiency 09/05/2014  . Migraines 09/05/2014  . Ovarian cyst 09/05/2014  . Irritable bowel syndrome with constipation 10/13/2011  . Varicose veins 02/12/2011  . Allergic rhinitis, seasonal 02/12/2011  . GERD (gastroesophageal reflux disease) 02/12/2011    Bess Harvest, PTA 12/27/19 6:12 PM   [facility] [place] [address]  [place], [place], [address] Phone: [phone]   Fax:  [phone]  Name: Joanna Lambert MRN: [id number] Date of Birth: 20-Oct-1955

## 2019-12-28 DIAGNOSIS — H524 Presbyopia: Secondary | ICD-10-CM | POA: Diagnosis not present

## 2019-12-28 DIAGNOSIS — H04123 Dry eye syndrome of bilateral lacrimal glands: Secondary | ICD-10-CM | POA: Diagnosis not present

## 2019-12-28 DIAGNOSIS — H52203 Unspecified astigmatism, bilateral: Secondary | ICD-10-CM | POA: Diagnosis not present

## 2019-12-28 DIAGNOSIS — H5203 Hypermetropia, bilateral: Secondary | ICD-10-CM | POA: Diagnosis not present

## 2019-12-28 DIAGNOSIS — H2513 Age-related nuclear cataract, bilateral: Secondary | ICD-10-CM | POA: Diagnosis not present

## 2019-12-28 DIAGNOSIS — H18513 Endothelial corneal dystrophy, bilateral: Secondary | ICD-10-CM | POA: Diagnosis not present

## 2019-12-28 DIAGNOSIS — H35412 Lattice degeneration of retina, left eye: Secondary | ICD-10-CM | POA: Diagnosis not present

## 2019-12-29 ENCOUNTER — Encounter: Payer: Self-pay | Admitting: Physical Therapy

## 2019-12-29 ENCOUNTER — Other Ambulatory Visit: Payer: Self-pay

## 2019-12-29 ENCOUNTER — Ambulatory Visit: Payer: Federal, State, Local not specified - PPO | Admitting: Physical Therapy

## 2019-12-29 DIAGNOSIS — M25562 Pain in left knee: Secondary | ICD-10-CM

## 2019-12-29 DIAGNOSIS — M6281 Muscle weakness (generalized): Secondary | ICD-10-CM

## 2019-12-29 DIAGNOSIS — M25662 Stiffness of left knee, not elsewhere classified: Secondary | ICD-10-CM | POA: Diagnosis not present

## 2019-12-29 DIAGNOSIS — R262 Difficulty in walking, not elsewhere classified: Secondary | ICD-10-CM

## 2019-12-29 NOTE — Patient Instructions (Signed)
    Home exercise program created by Ananth Fiallos, PT.  For questions, please contact Chera Slivka via phone at [phone] or email at [email or web address]  [facility] [place] [address]  [address] [place], [place], [address] Phone: [phone]   Fax:  [phone]    

## 2019-12-29 NOTE — Therapy (Addendum)
[facility] [address]  [place], [place], [address] Phone: [phone]   Fax:  [phone]  Physical Therapy Treatment / Discharge Summary  Patient Details  Name: Joanna Lambert MRN: [id number] Date of Birth: [birth year] Referring Provider (PT): Karlton Lemon, MD   Encounter Date: 12/29/2019   PT End of Session - 12/29/19 1453    Visit Number 8    Number of Visits 12    Date for PT Re-Evaluation 01/09/20    Authorization Type Federal BCBS    PT Start Time 1453   Pt arrived late   PT Stop Time 1527    PT Time Calculation (min) 34 min    Activity Tolerance Patient tolerated treatment well    Behavior During Therapy [facility] for tasks assessed/performed           Past Medical History:  Diagnosis Date  . Allergy    seasonal  . Migraines   . Ovarian cyst    right  . Restless legs   . Varicose veins   . Vitamin D deficiency 12/26/2014    Past Surgical History:  Procedure Laterality Date  . APPENDECTOMY    . BREAST BIOPSY    . BREAST SURGERY Right 2012   biopsy, benign cyst  . CARPAL TUNNEL RELEASE Bilateral 2010  . FOOT SURGERY Right 2011   bunion removal    There were no vitals filed for this visit.   Subjective Assessment - 12/29/19 1455    Subjective Pt reports knee is doing really well - at least 95% better. Notes she is more aware of Lambert she puts weight and shifts over L LE but is having an easier time with transitions that used to bother her (in/out of bed, floor transfers).    Diagnostic tests [facility] u/s left knee 11/09/19:  Mild effusion. Mod sized baker's cyst. No obvious med-ant lateral meniscus tear. Quad and patellar tendons intact. Medial hamstring tendons without abnormalities. Minimal arthropathy.    Patient Stated Goals "full ROM back"    Currently in Pain? No/denies              [facility] PT Assessment - 12/29/19 1453      Assessment   Medical Diagnosis L knee pain    Referring Provider  (PT) Karlton Lemon, MD    Next MD Visit PRN      Strength   Right Hip Flexion 4+/5    Right Hip Extension 4+/5    Right Hip External Rotation  4+/5    Right Hip Internal Rotation 5/5    Right Hip ABduction 5/5    Right Hip ADduction 4+/5    Left Hip Flexion 4+/5    Left Hip Extension 4+/5    Left Hip External Rotation 4/5   notes hesistancy to push too hard but no pain   Left Hip Internal Rotation 5/5    Left Hip ABduction 4+/5    Left Hip ADduction 4+/5    Right Knee Flexion 5/5    Right Knee Extension 5/5    Left Knee Flexion 5/5    Left Knee Extension 5/5    Right Ankle Dorsiflexion 5/5    Right Ankle Plantar Flexion 5/5    Left Ankle Dorsiflexion 5/5    Left Ankle Plantar Flexion 5/5                         [facility] Adult PT Treatment/Exercise - 12/29/19 1453  Exercises   Exercises Knee/Hip      Knee/Hip Exercises: Aerobic   Recumbent Bike L2 x 6 min      Knee/Hip Exercises: Standing   Hip Flexion Left;Right;10 reps;Stengthening;Knee straight    Hip Flexion Limitations red TB at ankle - 1 pole A for balance    Hip ADduction Left;Right;10 reps;Strengthening    Hip ADduction Limitations red TB at ankle - 1 pole A for balance    Hip Abduction Left;Right;10 reps;Stengthening;Knee straight    Abduction Limitations red TB at ankle - 1 pole A for balance    Hip Extension Left;Right;10 reps;Stengthening;Knee straight    Extension Limitations red TB at ankle - 1 pole A for balance    Wall Squat 10 reps;5 seconds;2 sets    Wall Squat Limitations + hip ADD ball squeeze & green TB hip ABD isometric x 1 set each    Other Standing Knee Exercises B side stepping & fwd/back monster walk with looped green TB at ankles 2 x 25 ft                  PT Education - 12/29/19 1527    Education Details Final HEP review & update    Person(s) Educated Patient    Methods Explanation;Demonstration;Handout    Comprehension Verbalized understanding;Returned  demonstration            PT Short Term Goals - 12/13/19 1455      PT SHORT TERM GOAL #1   Title Patient will be independent with initial HEP    Status Achieved   12/13/19            PT Long Term Goals - 12/29/19 1509      PT LONG TERM GOAL #1   Title Patient will be independent with ongoing/advanced HEP    Status Achieved      PT LONG TERM GOAL #2   Title Patient to improve L knee AROM to WNL without pain provocation    Status Achieved      PT LONG TERM GOAL #3   Title Patient will demonstrate improved L proximal LE strength to >/= 4+/5 for improved stability and ease of mobility    Status Achieved   except L hip ER 4/5     PT LONG TERM GOAL #4   Title Patient will complete floor to stand transfer w/o limitation due to L knee pain, LOM or weakness    Status Achieved      PT LONG TERM GOAL #5   Title Patient will resume normal exercise routine inlcuding walking, yoga and pilates w/o limitation due to L knee pain    Status Achieved   12/29/19 - has been progressing her walking but has not tried yoga or pilates however does not anticipate any issues                Plan - 12/29/19 1459    Clinical Impression Statement Joanna Lambert reports her L knee is at least 95% better since starting PT. Her L knee AROM is now symmetrical with her R knee and he overall L LE strength is now 4+/5 to 5/5 with only exception of L hip ER 4/5 (pt reluctant to provide increased effort with MMT resistance but anticipate strength actually >4/5). She reports she is now able to complete most daily activities w/o limitation due to L knee pain or instability but does note that she is slightly more cautious of Lambert she moves around due to L knee. All  goals met at this time and pt interested in trying to continue on her own with the HEP after review and update today, but will remain on hold for 30-day in the event that further issues arise with L knee or HEP.    Comorbidities Chronic venous insufficiency,  migraines, GERD, IBS, B CTR, R foot bunionectomy    Rehab Potential Good    PT Frequency --    PT Duration --    PT Treatment/Interventions ADLs/Self Care Home Management;Cryotherapy;Electrical Stimulation;Iontophoresis 47m/ml Dexamethasone;Moist Heat;Ultrasound;Gait training;Stair training;Functional mobility training;Therapeutic activities;Therapeutic exercise;Balance training;Neuromuscular re-education;Patient/family education;Manual techniques;Passive range of motion;Dry needling;Taping;Vasopneumatic Device;Joint Manipulations    PT Next Visit Plan 30-day hold    PT Home Exercise Plan 7/19 - gastroc, HS & ITB stretches, quad set & SLR; 7/27 - standing blue TB TKE; 8/10 - red TB (green TB provided 8/19) side steppiing & monster walk, fwd eccentric step-down, straight leg bridge +/- HS curl on [place]; 8/17 - wall squats; 8/19 - standing red TB 4-way SLR    Consulted and Agree with Plan of Care Patient           Patient will benefit from skilled therapeutic intervention in order to improve the following deficits and impairments:  Abnormal gait, Decreased activity tolerance, Decreased balance, Decreased endurance, Decreased mobility, Decreased range of motion, Decreased strength, Difficulty walking, Increased edema, Increased fascial restricitons, Increased muscle spasms, Impaired perceived functional ability, Impaired flexibility, Pain  Visit Diagnosis: Acute pain of left knee  Stiffness of left knee, not elsewhere classified  Muscle weakness (generalized)  Difficulty in walking, not elsewhere classified     Problem List Patient Active Problem List   Diagnosis Date Noted  . Bee allergy status 08/06/2016  . Right knee pain 09/27/2015  . Trigger finger, acquired 01/16/2015  . Preventative health care 12/26/2014  . Vitamin D deficiency 12/26/2014  . Chronic venous insufficiency 09/05/2014  . Migraines 09/05/2014  . Ovarian cyst 09/05/2014  . Irritable bowel syndrome with  constipation 10/13/2011  . Varicose veins 02/12/2011  . Allergic rhinitis, seasonal 02/12/2011  . GERD (gastroesophageal reflux disease) 02/12/2011    JPercival Spanish PT, MPT  12/29/2019, 5:29 PM  [facility] [place] [address]Phone: [phone]  Fax:  [phone] Name: Joanna ErbyMRN: [id number] of Birth: 505-May-1957 PHYSICAL THERAPY DISCHARGE SUMMARY  Visits from Start of Care: 8  Current functional level related to goals / functional outcomes:   Refer to above clinical impression for status as of last visit on 12/29/2019. Patient was placed on hold for 30 days and has not needed to return to PT, therefore will proceed with discharge from PT for this episode.   Remaining deficits:   As above.   Education / Equipment:   HEP  Plan: Patient agrees to discharge.  Patient goals were met. Patient is being discharged due to meeting the stated rehab goals.  ?????     JPercival Spanish PT, MPT 02/06/20, 8:59 AM  [facility] [address]Phone: [phone]  Fax:  [phone]

## 2020-01-05 ENCOUNTER — Encounter: Payer: Federal, State, Local not specified - PPO | Admitting: Physical Therapy

## 2020-01-09 ENCOUNTER — Encounter: Payer: Federal, State, Local not specified - PPO | Admitting: Physical Therapy

## 2020-03-14 ENCOUNTER — Other Ambulatory Visit: Payer: Self-pay

## 2020-03-14 ENCOUNTER — Ambulatory Visit (INDEPENDENT_AMBULATORY_CARE_PROVIDER_SITE_OTHER): Payer: Federal, State, Local not specified - PPO

## 2020-03-14 DIAGNOSIS — Z23 Encounter for immunization: Secondary | ICD-10-CM | POA: Diagnosis not present

## 2020-03-14 NOTE — Progress Notes (Signed)
Pt was here today for Flu shot. Pt got flu shot in Left deltoid. Pt tolerated well

## 2020-03-28 ENCOUNTER — Encounter: Payer: Self-pay | Admitting: Nurse Practitioner

## 2020-03-28 ENCOUNTER — Ambulatory Visit (INDEPENDENT_AMBULATORY_CARE_PROVIDER_SITE_OTHER): Payer: Federal, State, Local not specified - PPO | Admitting: Nurse Practitioner

## 2020-03-28 ENCOUNTER — Other Ambulatory Visit: Payer: Self-pay

## 2020-03-28 VITALS — BP 118/78 | Ht 64.0 in | Wt 136.0 lb

## 2020-03-28 DIAGNOSIS — Z01419 Encounter for gynecological examination (general) (routine) without abnormal findings: Secondary | ICD-10-CM | POA: Diagnosis not present

## 2020-03-28 DIAGNOSIS — N898 Other specified noninflammatory disorders of vagina: Secondary | ICD-10-CM | POA: Diagnosis not present

## 2020-03-28 DIAGNOSIS — M8589 Other specified disorders of bone density and structure, multiple sites: Secondary | ICD-10-CM | POA: Diagnosis not present

## 2020-03-28 DIAGNOSIS — G43009 Migraine without aura, not intractable, without status migrainosus: Secondary | ICD-10-CM

## 2020-03-28 MED ORDER — ESTRING 2 MG VA RING: VAGINAL_RING | VAGINAL | 4 refills | Status: DC

## 2020-03-28 NOTE — Patient Instructions (Signed)

## 2020-03-28 NOTE — Progress Notes (Signed)
Joanna Lambert Aug 17, 1955 [id number]   History:  64 y.o. G2P0002 presents for annual exam. Postmenopausal with no bleeding. Estring for severe vaginal dryness and migraine management. She has always have cyclic migraines and they have persisted despite menopause 6 years ago. When changing Estring every 3 months she would notice increase in headaches 2-3 weeks before exchange so she was increased to every 2 months with good relief. He has never been evaluated by neurology. Normal pap and mammogram history. Osteopenia.   Gynecologic History Patient's last menstrual period was 05/12/2013.   Contraception: post menopausal status Last Pap: 03/2018. Results were: normal Last mammogram: 04/2019. Results were: normal Last colonoscopy: 2018. Results were: polyp Last Dexa: 05/2018. Results were: t-score -1.3  Past medical history, past surgical history, family history and social history were all reviewed and documented in the EPIC chart.  ROS:  A ROS was performed and pertinent positives and negatives are included.  Exam:  Vitals:   03/28/20 0930  BP: 118/78  Weight: 136 lb (61.7 kg)  Height: 5\' 4"  (1.626 m)   Body mass index is 23.34 kg/m.  General appearance:  Normal Thyroid:  Symmetrical, normal in size, without palpable masses or nodularity. Respiratory  Auscultation:  Clear without wheezing or rhonchi Cardiovascular  Auscultation:  Regular rate, without rubs, murmurs or gallops  Edema/varicosities:  Not grossly evident Abdominal  Soft,nontender, without masses, guarding or rebound.  Liver/spleen:  No organomegaly noted  Hernia:  None appreciated  Skin  Inspection:  Grossly normal   Breasts: Examined lying and sitting.   Right: Without masses, retractions, discharge or axillary adenopathy.   Left: Without masses, retractions, discharge or axillary adenopathy. Gentitourinary   Inguinal/mons:  Normal without inguinal adenopathy  External genitalia:   Normal  BUS/Urethra/Skene's glands:  Normal  Vagina:  Atrophic changes  Cervix:  Normal  Uterus:  Normal in size, shape and contour.  Midline and mobile  Adnexa/parametria:     Rt: Without masses or tenderness.   Lt: Without masses or tenderness.  Anus and perineum: Normal  Digital rectal exam: Normal sphincter tone without palpated masses or tenderness  Assessment/Plan:  64 y.o. G2P0002 for annual exam.   Well female exam with routine gynecological exam - Education provided on SBEs, importance of preventative screenings, current guidelines, high calcium diet, regular exercise, and multivitamin daily. Labs with PCP.   Osteopenia of multiple sites - taking daily vitamin D supplement and exercising regularly.   Vaginal dryness - Plan: estradiol (ESTRING) 2 MG vaginal ring every 2 months. We discussed use of vaginal hormones and slight risk for systemic absorption increasing the risk for blood clots, heart attack, stroke, and breast cancer. Recommend trying to decrease this back to every 3 months and she is agreeable.  Migraine without aura and without status migrainosus, not intractable -history of cyclic migraines since adolescence and these have persisted despite menopause 6 years ago. She uses Estring 2 mg and exchanges every 2 months and reports that when she was doing every 3 months, headaches would start 2 to 3 weeks before exchange was due. She feels headaches have improved since switching to 2 months but she is aware of the risk of continuing hormones. We both agree she may benefit from a neurology evaluation for her chronic history of migraines. She does use Maxalt as needed.  Screening for cervical cancer -normal Pap history. Will repeat at 5-year interval per guidelines. Discussed option to stop screening at age 38 and we will reevaluate on an annual basis.  Screening for breast cancer -normal mammogram history. Continue annual screenings. Normal breast exam today.  Screening for  colon cancer -screening colonoscopy in 2018, polyps. Will repeat at 5-year interval per GI's recommendations.  Follow up in 1 year for annual.    Tamela Gammon [facility], 9:46 AM 03/28/2020

## 2020-04-25 DIAGNOSIS — L814 Other melanin hyperpigmentation: Secondary | ICD-10-CM | POA: Diagnosis not present

## 2020-04-25 DIAGNOSIS — L821 Other seborrheic keratosis: Secondary | ICD-10-CM | POA: Diagnosis not present

## 2020-04-25 DIAGNOSIS — D225 Melanocytic nevi of trunk: Secondary | ICD-10-CM | POA: Diagnosis not present

## 2020-04-25 DIAGNOSIS — D1801 Hemangioma of skin and subcutaneous tissue: Secondary | ICD-10-CM | POA: Diagnosis not present

## 2020-05-16 DIAGNOSIS — L648 Other androgenic alopecia: Secondary | ICD-10-CM | POA: Diagnosis not present

## 2020-05-22 ENCOUNTER — Other Ambulatory Visit (HOSPITAL_BASED_OUTPATIENT_CLINIC_OR_DEPARTMENT_OTHER): Payer: Self-pay | Admitting: Family

## 2020-05-22 DIAGNOSIS — Z1231 Encounter for screening mammogram for malignant neoplasm of breast: Secondary | ICD-10-CM

## 2020-05-28 ENCOUNTER — Encounter (HOSPITAL_BASED_OUTPATIENT_CLINIC_OR_DEPARTMENT_OTHER): Payer: Federal, State, Local not specified - PPO

## 2020-05-28 ENCOUNTER — Encounter (HOSPITAL_BASED_OUTPATIENT_CLINIC_OR_DEPARTMENT_OTHER): Payer: Self-pay

## 2020-05-28 DIAGNOSIS — Z1231 Encounter for screening mammogram for malignant neoplasm of breast: Secondary | ICD-10-CM

## 2020-05-29 ENCOUNTER — Other Ambulatory Visit (HOSPITAL_BASED_OUTPATIENT_CLINIC_OR_DEPARTMENT_OTHER): Payer: Self-pay | Admitting: Family

## 2020-05-29 DIAGNOSIS — Z1231 Encounter for screening mammogram for malignant neoplasm of breast: Secondary | ICD-10-CM

## 2020-06-04 ENCOUNTER — Encounter (HOSPITAL_BASED_OUTPATIENT_CLINIC_OR_DEPARTMENT_OTHER): Payer: Federal, State, Local not specified - PPO

## 2020-06-04 ENCOUNTER — Encounter (HOSPITAL_BASED_OUTPATIENT_CLINIC_OR_DEPARTMENT_OTHER): Payer: Self-pay

## 2020-06-04 DIAGNOSIS — Z1231 Encounter for screening mammogram for malignant neoplasm of breast: Secondary | ICD-10-CM

## 2020-06-06 ENCOUNTER — Other Ambulatory Visit: Payer: Self-pay

## 2020-06-06 ENCOUNTER — Ambulatory Visit (HOSPITAL_BASED_OUTPATIENT_CLINIC_OR_DEPARTMENT_OTHER)
Admission: RE | Admit: 2020-06-06 | Discharge: 2020-06-06 | Disposition: A | Discharge status: Home / Self Care | Payer: Federal, State, Local not specified - PPO | Source: Ambulatory Visit | Attending: Family | Admitting: Family

## 2020-06-06 ENCOUNTER — Other Ambulatory Visit (HOSPITAL_BASED_OUTPATIENT_CLINIC_OR_DEPARTMENT_OTHER): Payer: Self-pay | Admitting: Family

## 2020-06-06 DIAGNOSIS — Z1231 Encounter for screening mammogram for malignant neoplasm of breast: Secondary | ICD-10-CM

## 2020-06-07 ENCOUNTER — Other Ambulatory Visit: Payer: Self-pay | Admitting: Family

## 2020-06-07 DIAGNOSIS — R928 Other abnormal and inconclusive findings on diagnostic imaging of breast: Secondary | ICD-10-CM

## 2020-06-21 ENCOUNTER — Other Ambulatory Visit: Payer: Federal, State, Local not specified - PPO

## 2020-06-26 ENCOUNTER — Ambulatory Visit: Payer: Federal, State, Local not specified - PPO

## 2020-06-26 ENCOUNTER — Other Ambulatory Visit: Payer: Self-pay

## 2020-06-26 ENCOUNTER — Ambulatory Visit
Admission: RE | Admit: 2020-06-26 | Discharge: 2020-06-26 | Disposition: A | Discharge status: Home / Self Care | Payer: Federal, State, Local not specified - PPO | Source: Ambulatory Visit | Attending: Family | Admitting: Family

## 2020-06-26 DIAGNOSIS — N6489 Other specified disorders of breast: Secondary | ICD-10-CM | POA: Diagnosis not present

## 2020-06-26 DIAGNOSIS — R928 Other abnormal and inconclusive findings on diagnostic imaging of breast: Secondary | ICD-10-CM

## 2020-07-27 ENCOUNTER — Telehealth: Payer: Self-pay | Admitting: Family

## 2020-07-27 ENCOUNTER — Encounter: Payer: Self-pay | Admitting: Family

## 2020-07-27 ENCOUNTER — Other Ambulatory Visit: Payer: Self-pay

## 2020-07-27 ENCOUNTER — Ambulatory Visit (INDEPENDENT_AMBULATORY_CARE_PROVIDER_SITE_OTHER): Payer: Federal, State, Local not specified - PPO | Admitting: Family

## 2020-07-27 VITALS — BP 117/75 | HR 77 | Temp 98.2°F | Resp 16 | Ht 64.0 in | Wt 136.0 lb

## 2020-07-27 DIAGNOSIS — Z0000 Encounter for general adult medical examination without abnormal findings: Secondary | ICD-10-CM

## 2020-07-27 DIAGNOSIS — E785 Hyperlipidemia, unspecified: Secondary | ICD-10-CM

## 2020-07-27 DIAGNOSIS — R590 Localized enlarged lymph nodes: Secondary | ICD-10-CM

## 2020-07-27 DIAGNOSIS — E559 Vitamin D deficiency, unspecified: Secondary | ICD-10-CM

## 2020-07-27 DIAGNOSIS — L659 Nonscarring hair loss, unspecified: Secondary | ICD-10-CM | POA: Diagnosis not present

## 2020-07-27 LAB — COMPREHENSIVE METABOLIC PANEL
ALT: 16 U/L (ref 0–35)
AST: 18 U/L (ref 0–37)
Albumin: 4.5 g/dL (ref 3.5–5.2)
Alkaline Phosphatase: 92 U/L (ref 39–117)
BUN: 15 mg/dL (ref 6–23)
CO2: 31 mEq/L (ref 19–32)
Calcium: 10 mg/dL (ref 8.4–10.5)
Chloride: 100 mEq/L (ref 96–112)
Creatinine, Ser: 0.85 mg/dL (ref 0.40–1.20)
GFR: 73.21 mL/min (ref >60.00)
Glucose, Bld: 86 mg/dL (ref 70–99)
Potassium: 4.7 mEq/L (ref 3.5–5.1)
Sodium: 139 mEq/L (ref 135–145)
Total Bilirubin: 0.6 mg/dL (ref 0.2–1.2)
Total Protein: 7.2 g/dL (ref 6.0–8.3)

## 2020-07-27 LAB — LIPID PANEL
Cholesterol: 205 mg/dL — ABNORMAL HIGH (ref 0–200)
HDL: 50.9 mg/dL (ref >39.00)
LDL Cholesterol: 135 mg/dL — ABNORMAL HIGH (ref 0–99)
NonHDL: 154.57
Total CHOL/HDL Ratio: 4
Triglycerides: 100 mg/dL (ref 0.0–149.0)
VLDL: 20 mg/dL (ref 0.0–40.0)

## 2020-07-27 LAB — VITAMIN D 25 HYDROXY (VIT D DEFICIENCY, FRACTURES): VITD: 20.3 ng/mL — ABNORMAL LOW (ref 30.00–100.00)

## 2020-07-27 LAB — IRON: Iron: 123 ug/dL (ref 42–145)

## 2020-07-27 LAB — TSH: TSH: 3.09 u[IU]/mL (ref 0.35–4.50)

## 2020-07-27 MED ORDER — VITAMIN D (ERGOCALCIFEROL) 1.25 MG (50000 UNIT) PO CAPS: 50000.0000 [IU] | ORAL_CAPSULE | ORAL | 0 refills | Status: DC

## 2020-07-27 NOTE — Telephone Encounter (Signed)
Cholesterol is better than it was last year.  Iron looks great.  Kidneys, sugar, liver electrolytes and thyroid are normal.   Vitamin D level is low.  Advise patient to begin vit D 50000 units once weekly for 12 weeks, then repeat vit D level (dx Vit D deficiency).

## 2020-07-27 NOTE — Telephone Encounter (Signed)
Patient advised of resutls and new rx. She will call back to schedule lab appt

## 2020-07-27 NOTE — Patient Instructions (Signed)
Please complete lab work prior to leaving.   

## 2020-07-27 NOTE — Progress Notes (Signed)
Subjective:    Patient ID: Joanna Lambert, female    DOB: [birth year], 65 y.o.   MRN: [id number]  HPI  Patient presents today for complete physical.  Immunizations: pfizer x 3. She will get a 4th in drug trial Diet: healthy Exercise: walks daily, tai chi Colonoscopy: due 2/23 Dexa: 2020 Pap Smear: due 11/22- sees GYN Mammogram: 06/06/20 Vision: up to date Dental: up to date  Reports 3x over the last year she had 90's/60's  Review of Systems  Constitutional: Negative for unexpected weight change.  HENT: Negative for hearing loss and rhinorrhea.   Eyes: Negative for visual disturbance.  Respiratory: Negative for cough and shortness of breath.   Cardiovascular: Negative for chest pain.  Gastrointestinal: Negative for blood in stool, constipation and diarrhea.  Genitourinary: Negative for dysuria, frequency and hematuria.  Musculoskeletal: Negative for myalgias.  Skin: Negative for rash.  Neurological: Positive for headaches (migraines- 3 x a month).  Hematological: Positive for adenopathy (reports one in her cervical ).  Psychiatric/Behavioral:       Denies depression/anxiety       Past Medical History:  Diagnosis Date  . Allergy    seasonal  . Migraines   . Ovarian cyst    right  . Restless legs   . Varicose veins   . Vitamin D deficiency 12/26/2014     Social History   Socioeconomic History  . Marital status: Married    Spouse name: Not on file  . Number of children: Not on file  . Years of education: Not on file  . Highest education level: Not on file  Occupational History  . Not on file  Tobacco Use  . Smoking status: Never Smoker  . Smokeless tobacco: Never Used  Vaping Use  . Vaping Use: Never used  Substance and Sexual Activity  . Alcohol use: Yes    Comment: once a month per pt  . Drug use: No  . Sexual activity: Yes    Birth control/protection: Post-menopausal    Comment: 1st intercourse 65 yo-Fewer than 5 partners  Other Topics Concern  .  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     Social Determinants of Health   Financial Resource Strain: Not on file  Food Insecurity: Not on file  Transportation Needs: Not on file  Physical Activity: Not on file  Stress: Not on file  Social Connections: Not on file  Intimate Partner Violence: Not on file    Past Surgical History:  Procedure Laterality Date  . APPENDECTOMY    . BREAST BIOPSY    . BREAST SURGERY Right 2012   biopsy, benign cyst  . CARPAL TUNNEL RELEASE Bilateral 2010  . FOOT SURGERY Right 2011   bunion removal    Family History  Problem Relation Age of Onset  . Diabetes Mother   . Hypertension Mother 27  . Hyperlipidemia Mother   . Heart disease Mother        Aortic stenosis  . Heart disease Father 16       pacemaker  . Atrial fibrillation Father 34  . Peripheral vascular disease Father        s/p Carotid Edartarectomy  . Dementia Father   . Colon cancer Neg Hx     Allergies  Allergen  Reactions  . Bee Venom     Local swelling  . Adhesive [Tape] Rash    Current Outpatient Medications on File Prior to Visit  Medication Sig Dispense Refill  . aspirin 81 MG tablet Take 81 mg by mouth daily.    . Biotin 5000 MCG CAPS Take by mouth.    . calcium carbonate (OS-CAL - DOSED IN MG OF ELEMENTAL CALCIUM) 1250 (500 Ca) MG tablet Take by mouth.    . clotrimazole-betamethasone (LOTRISONE) cream APPLY EXTERNALLY TO THE AFFECTED AREA TWICE DAILY 45 g 1  . EPINEPHrine (EPIPEN 2-PAK) 0.3 mg/0.3 mL IJ SOAJ injection Inject 0.3 mLs (0.3 mg total) into the muscle as needed for anaphylaxis. 1 each 1  . estradiol (ESTRING) 2 MG vaginal ring INSERT( REPLACE) 1 RING VAGINALLY EVERY 2 MONTHS 2 each 4  . metroNIDAZOLE (METROCREAM) 0.75 % cream Apply 1 application topically daily.  11  .  Minoxidil 5 % FOAM Apply topically.    . rizatriptan (MAXALT) 10 MG tablet May repeat in 2 hours if needed 10 tablet 3   Current Facility-Administered Medications on File Prior to Visit  Medication Dose Route Frequency Provider Last Rate Last Admin  . 0.9 %  sodium chloride infusion  500 mL Intravenous Continuous Nandigam, Kavitha V, MD        BP 117/75 (BP Location: Right Arm, Patient Position: Sitting, Cuff Size: Small)   Pulse 77   Temp 98.2 F (36.8 C) (Oral)   Resp 16   Ht '5\' 4"'  (1.626 m)   Wt 136 lb (61.7 kg)   LMP 05/12/2013   SpO2 99%   BMI 23.34 kg/m    Objective:   Physical Exam  Physical Exam  Constitutional: She is oriented to person, place, and time. She appears well-developed and well-nourished. No distress.  HENT:  Head: Normocephalic and atraumatic.  Right Ear: Tympanic membrane and ear canal normal.  Left Ear: Tympanic membrane and ear canal normal.  Mouth/Throat: Not examined- pt wearing mask Eyes: Pupils are equal, round, and reactive to light. No scleral icterus.  Neck: Normal range of motion. No thyromegaly present.  Cardiovascular: Normal rate and regular rhythm.   No murmur heard. Pulmonary/Chest: Effort normal and breath sounds normal. No respiratory distress. He has no wheezes. She has no rales. She exhibits no tenderness.  Abdominal: Soft. Bowel sounds are normal. She exhibits no distension and no mass. There is no tenderness. There is no rebound and no guarding.  Musculoskeletal: She exhibits no edema.  Lymphadenopathy:    She has no cervical adenopathy.  Neurological: She is alert and oriented to person, place, and time. She has normal patellar reflexes. She exhibits normal muscle tone. Coordination normal.  Skin: Skin is warm and dry.  Psychiatric: She has a normal mood and affect. Her behavior is normal. Judgment and thought content normal.  Breast/pelvic: deferred           Assessment & Plan:    Preventative care- continue healthy  diet, exercise.  Pap, mammo, colo up to date. Labs as ordered.  She has had a bout 3 episodes of sbp 90's which resolved on it's own. I advised pt on hydration and to let me know if this becomes more frequent. She is requesting iron per dermatology due to hair thinning.    This visit occurred during the SARS-CoV-2 public health emergency.  Safety protocols were in place, including screening questions prior to the visit, additional usage of staff PPE, and extensive cleaning of exam room  while observing appropriate contact time as indicated for disinfecting solutions.        Assessment & Plan:

## 2020-07-27 NOTE — Telephone Encounter (Signed)
Called but no answer lvm for patient to call back 

## 2020-07-30 ENCOUNTER — Other Ambulatory Visit: Payer: Self-pay

## 2020-07-30 DIAGNOSIS — E559 Vitamin D deficiency, unspecified: Secondary | ICD-10-CM

## 2020-08-01 ENCOUNTER — Ambulatory Visit (HOSPITAL_BASED_OUTPATIENT_CLINIC_OR_DEPARTMENT_OTHER)
Admission: RE | Admit: 2020-08-01 | Discharge: 2020-08-01 | Disposition: A | Discharge status: Home / Self Care | Payer: Federal, State, Local not specified - PPO | Source: Ambulatory Visit | Attending: Family | Admitting: Family

## 2020-08-01 ENCOUNTER — Other Ambulatory Visit: Payer: Self-pay

## 2020-08-01 DIAGNOSIS — R590 Localized enlarged lymph nodes: Secondary | ICD-10-CM | POA: Diagnosis not present

## 2020-11-13 DIAGNOSIS — L648 Other androgenic alopecia: Secondary | ICD-10-CM | POA: Diagnosis not present

## 2021-01-18 ENCOUNTER — Other Ambulatory Visit: Payer: Self-pay

## 2021-01-18 ENCOUNTER — Telehealth: Payer: Self-pay

## 2021-01-18 DIAGNOSIS — I872 Venous insufficiency (chronic) (peripheral): Secondary | ICD-10-CM

## 2021-01-18 NOTE — Telephone Encounter (Signed)
Patient seen in 2016 by Dr. Donnetta Hutching for some venous HTN - would like another appt for evaluation. She denies any swelling - same sort of symptoms from 2016 - leg achiness and heaviness at night. Her left leg is worse than her right leg. I placed her on the schedule for venous reflux study on the left and a visit with MD.

## 2021-01-21 DIAGNOSIS — H2513 Age-related nuclear cataract, bilateral: Secondary | ICD-10-CM | POA: Diagnosis not present

## 2021-01-21 DIAGNOSIS — H04123 Dry eye syndrome of bilateral lacrimal glands: Secondary | ICD-10-CM | POA: Diagnosis not present

## 2021-01-21 DIAGNOSIS — H35412 Lattice degeneration of retina, left eye: Secondary | ICD-10-CM | POA: Diagnosis not present

## 2021-01-21 DIAGNOSIS — H52203 Unspecified astigmatism, bilateral: Secondary | ICD-10-CM | POA: Diagnosis not present

## 2021-01-21 DIAGNOSIS — H18513 Endothelial corneal dystrophy, bilateral: Secondary | ICD-10-CM | POA: Diagnosis not present

## 2021-01-21 DIAGNOSIS — H524 Presbyopia: Secondary | ICD-10-CM | POA: Diagnosis not present

## 2021-01-21 DIAGNOSIS — H5203 Hypermetropia, bilateral: Secondary | ICD-10-CM | POA: Diagnosis not present

## 2021-01-22 ENCOUNTER — Other Ambulatory Visit: Payer: Self-pay | Admitting: Nurse Practitioner

## 2021-01-22 ENCOUNTER — Encounter: Payer: Self-pay | Admitting: Family

## 2021-01-22 NOTE — Telephone Encounter (Signed)
AEX 03/28/20

## 2021-01-25 ENCOUNTER — Ambulatory Visit (HOSPITAL_COMMUNITY)
Admission: RE | Admit: 2021-01-25 | Discharge: 2021-01-25 | Disposition: A | Discharge status: Home / Self Care | Payer: Federal, State, Local not specified - PPO | Source: Ambulatory Visit | Attending: Vascular Surgery | Admitting: Vascular Surgery

## 2021-01-25 ENCOUNTER — Encounter: Payer: Federal, State, Local not specified - PPO | Admitting: Vascular Surgery

## 2021-01-25 ENCOUNTER — Ambulatory Visit (INDEPENDENT_AMBULATORY_CARE_PROVIDER_SITE_OTHER): Payer: Federal, State, Local not specified - PPO | Admitting: Vascular Surgery

## 2021-01-25 ENCOUNTER — Encounter: Payer: Self-pay | Admitting: Vascular Surgery

## 2021-01-25 ENCOUNTER — Other Ambulatory Visit: Payer: Self-pay

## 2021-01-25 ENCOUNTER — Encounter (HOSPITAL_COMMUNITY): Payer: Federal, State, Local not specified - PPO

## 2021-01-25 VITALS — BP 130/84 | HR 71 | Temp 98.2°F | Resp 20 | Ht 64.0 in | Wt 136.0 lb

## 2021-01-25 DIAGNOSIS — I872 Venous insufficiency (chronic) (peripheral): Secondary | ICD-10-CM | POA: Insufficient documentation

## 2021-01-25 DIAGNOSIS — R29898 Other symptoms and signs involving the musculoskeletal system: Secondary | ICD-10-CM | POA: Diagnosis not present

## 2021-01-25 NOTE — Progress Notes (Signed)
Office Note     CC: Left lower extremity heaviness Requesting Provider:  Debbrah Alar, NP  HPI: Joanna Lambert is a 65 y.o. (1955/08/31) female who presents at the request of Debbrah Alar, NP for evaluation of left lower extremity heaviness and paresthesias.  Basheba has appreciated left lower extremity heaviness over the last 8 years. Over the course of the last several months, she has noticed it worsening.  Janeli works for [employer] for years, however is now retired.  She continues to live an active lifestyle working out daily.  The heaviness worsens throughout the day and is associated with a tired feeling.  She denies swelling, wounds or superficial varicosities.  Elevation improves her symptoms, however has not found compression beneficial.  She wakes up at night on occasion with paresthesias along the skin associated with the anterior compartment of the calf.  She denies symptoms associated with claudication.  Jacia has a family history of chronic venous insufficiency, her father who underwent saphenous vein stripping. She has no history of DVT.   The pt is not on a statin for cholesterol management.  The pt is on a daily aspirin.   Other AC: None The pt is not on medication for hypertension.   The pt is not diabetic.   Tobacco hx: None  Past Medical History:  Diagnosis Date   Allergy    seasonal   Migraines    Ovarian cyst    right   Restless legs    Varicose veins    Vitamin D deficiency 12/26/2014    Past Surgical History:  Procedure Laterality Date   APPENDECTOMY     BREAST BIOPSY     BREAST SURGERY Right 2012   biopsy, benign cyst   CARPAL TUNNEL RELEASE Bilateral 2010   FOOT SURGERY Right 2011   bunion removal    Social History   Socioeconomic History   Marital status: Married    Spouse name: Not on file   Number of children: Not on file   Years of education: Not on file   Highest education level: Not on file  Occupational History   Not  on file  Tobacco Use   Smoking status: Never   Smokeless tobacco: Never  Vaping Use   Vaping Use: Never used  Substance and Sexual Activity   Alcohol use: Yes    Comment: once a month per pt   Drug use: No   Sexual activity: Yes    Birth control/protection: Post-menopausal    Comment: 1st intercourse 65 yo-Fewer than 5 partners  Other Topics Concern   Not on file  Social History Narrative   Married   2 children (daughter lives here- she has two sons)   Son lives in [place]   Retired from Teaching laboratory technician (personel assisting users)   [place] her 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     Social Determinants of Health   Financial Resource Strain: Not on file  Food Insecurity: Not on file  Transportation Needs: Not on file  Physical Activity: Not on file  Stress: Not on file  Social Connections: Not on file  Intimate Partner Violence: Not on file    Family History  Problem Relation Age of Onset   Diabetes Mother    Hypertension Mother 82   Hyperlipidemia Mother    Heart disease Mother        Aortic stenosis   Heart disease Father 65       pacemaker   Atrial fibrillation Father 57  Peripheral vascular disease Father        s/p Carotid Edartarectomy   Dementia Father    Colon cancer Neg Hx     Current Outpatient Medications  Medication Sig Dispense Refill   aspirin 81 MG tablet Take 81 mg by mouth daily.     Biotin 5000 MCG CAPS Take by mouth.     calcium carbonate (OS-CAL - DOSED IN MG OF ELEMENTAL CALCIUM) 1250 (500 Ca) MG tablet Take by mouth.     clotrimazole-betamethasone (LOTRISONE) cream APPLY EXTERNALLY TO THE AFFECTED AREA TWICE DAILY 45 g 1   EPINEPHrine (EPIPEN 2-PAK) 0.3 mg/0.3 mL IJ SOAJ injection Inject 0.3 mLs (0.3 mg total) into the muscle as needed for anaphylaxis. 1 each 1   estradiol (ESTRING) 2 MG vaginal ring INSERT( REPLACE) 1 RING VAGINALLY EVERY 2 MONTHS 2 each 4   metroNIDAZOLE (METROCREAM) 0.75 % cream Apply 1 application topically daily.  11   Minoxidil 5  % FOAM Apply topically.     rizatriptan (MAXALT) 10 MG tablet TAKE BY MOUTH AS DIRECTED MAY REPEAT IN 2 HOURS IF NEEDED 10 tablet 3   Vitamin D, Ergocalciferol, (DRISDOL) 1.25 MG (50000 UNIT) CAPS capsule Take 1 capsule (50,000 Units total) by mouth every 7 (seven) days. 12 capsule 0   No current facility-administered medications for this visit.    Allergies  Allergen Reactions   Bee Venom     Local swelling   Adhesive [Tape] Rash     REVIEW OF SYSTEMS:   '[X]'$  denotes positive finding, '[ ]'$  denotes negative finding Cardiac  Comments:  Chest pain or chest pressure:    Shortness of breath upon exertion:    Short of breath when lying flat:    Irregular heart rhythm:        Vascular    Pain in calf, thigh, or hip brought on by ambulation:    Pain in feet at night that wakes you up from your sleep:  X Intermittent superficial calf paraesthesias at night  Blood clot in your veins:    Leg swelling:         Pulmonary    Oxygen at home:    Productive cough:     Wheezing:         Neurologic    Sudden weakness in arms or legs:     Sudden numbness in arms or legs:     Sudden onset of difficulty speaking or slurred speech:    Temporary loss of vision in one eye:     Problems with dizziness:         Gastrointestinal    Blood in stool:     Vomited blood:         Genitourinary    Burning when urinating:     Blood in urine:        Psychiatric    Major depression:         Hematologic    Bleeding problems:    Problems with blood clotting too easily:        Skin    Rashes or ulcers:        Constitutional    Fever or chills:      PHYSICAL EXAMINATION:  There were no vitals filed for this visit.  General:  WDWN in NAD; vital signs documented above Gait: Not observed HENT: WNL, normocephalic Pulmonary: normal non-labored breathing  Cardiac: regular HR,  Abdomen: soft, NT, no masses Skin: without rashes Vascular Exam/Pulses:  Right Left  Radial 2+ (normal) 2+  (normal)  Ulnar 2+ (normal) 2+ (normal)  Femoral - -  Popliteal - -  DP - -  PT 2+ (normal) 2+ (normal)   Extremities: without ischemic changes, without Gangrene , without cellulitis; without open wounds;  Musculoskeletal: no muscle wasting or atrophy  Neurologic: A&O X 3;  No focal weakness or paresthesias are detected Psychiatric:  The pt has Normal affect.   Non-Invasive Vascular Imaging:   Noninvasive vascular imaging was independently reviewed demonstrating no superficial thrombophlebitis, DVT, venous insufficiency in the left lower extremity.    ASSESSMENT/PLAN:: 65 y.o. female presenting with bilateral lower extremity (L>R) heaviness, with associated intermittent nocturnal paresthesias that the skin in the left anterior calf.  Ultrasound illustrates normal venous flow without superficial thrombophlebitis, DVT, venous insufficiency. Patient does not have chronic venous insufficiency.There are no concerns for peripheral arterial disease.  Unfortunately, the patient's heaviness and intermittent paresthesias remain idiopathic. I asked Careen to call our office if any questions or concerns arise.    Broadus John, MD [facility] [phone]

## 2021-04-26 DIAGNOSIS — D225 Melanocytic nevi of trunk: Secondary | ICD-10-CM | POA: Diagnosis not present

## 2021-04-26 DIAGNOSIS — L988 Other specified disorders of the skin and subcutaneous tissue: Secondary | ICD-10-CM | POA: Diagnosis not present

## 2021-04-26 DIAGNOSIS — L298 Other pruritus: Secondary | ICD-10-CM | POA: Diagnosis not present

## 2021-04-26 DIAGNOSIS — L820 Inflamed seborrheic keratosis: Secondary | ICD-10-CM | POA: Diagnosis not present

## 2021-04-26 DIAGNOSIS — L821 Other seborrheic keratosis: Secondary | ICD-10-CM | POA: Diagnosis not present

## 2021-04-26 DIAGNOSIS — Z789 Other specified health status: Secondary | ICD-10-CM | POA: Diagnosis not present

## 2021-04-26 DIAGNOSIS — L814 Other melanin hyperpigmentation: Secondary | ICD-10-CM | POA: Diagnosis not present

## 2021-05-20 ENCOUNTER — Ambulatory Visit: Payer: Federal, State, Local not specified - PPO | Admitting: Nurse Practitioner

## 2021-05-21 NOTE — Progress Notes (Signed)
Joanna Lambert [birth year] [id number]   History:  66 y.o. G2P0002 presents for annual exam. Postmenopausal with no bleeding. Was using Estring for severe vaginal dryness and migraine management. Stopped using 6 months ago and migraines have been minimal. She does have vaginal dryness that bothers her most days.  Normal pap and mammogram history. Osteopenia. Seeing vascular for varicose veins.   Gynecologic History Patient's last menstrual period was 05/12/2013.   Contraception: post menopausal status Sexually active: Yes  Health maintenance Last Pap: 03/23/2018. Results were: Normal, 5-year repeat Last mammogram: 06/06/2020. Results were: Left breast asymmetry, resolved on f/u U/S 06/26/2020 Last colonoscopy: 06/13/2016. Results were: Adenoma, 5-year recall Last Dexa: 05/25/2018. Results were: T-score -1.3  Past medical history, past surgical history, family history and social history were all reviewed and documented in the EPIC chart. Married. Daughter lives here with 2 children ages 64 and 26, son up [place] (getting married this year). Moved here from [place] to be close to grandchildren. Mother and father with history of heart disease.   ROS:  A ROS was performed and pertinent positives and negatives are included.  Exam:  Vitals:   05/22/21 0908  BP: 118/76  Weight: 137 lb (62.1 kg)  Height: 5\' 4"  (1.626 m)    Body mass index is 23.52 kg/m.  General appearance:  Normal Thyroid:  Symmetrical, normal in size, without palpable masses or nodularity. Respiratory  Auscultation:  Clear without wheezing or rhonchi Cardiovascular  Auscultation:  Regular rate, without rubs, murmurs or gallops  Edema/varicosities:  Not grossly evident Abdominal  Soft,nontender, without masses, guarding or rebound.  Liver/spleen:  No organomegaly noted  Hernia:  None appreciated  Skin  Inspection:  Grossly normal   Breasts: Examined lying and sitting.   Right: Without masses, retractions, discharge or  axillary adenopathy.   Left: Without masses, retractions, discharge or axillary adenopathy. Genitourinary   Inguinal/mons:  Normal without inguinal adenopathy  External genitalia:  Normal appearing vulva with no masses, tenderness, or lesions  BUS/Urethra/Skene's glands:  Normal  Vagina:  Normal appearing with normal color and discharge, no lesions. Atrophic changes  Cervix:  Normal appearing without discharge or lesions  Uterus:  Normal in size, shape and contour.  Midline and mobile, nontender  Adnexa/parametria:     Rt: Normal in size, without masses or tenderness.   Lt: Normal in size, without masses or tenderness.  Anus and perineum: Normal  Digital rectal exam: Normal sphincter tone without palpated masses or tenderness  Patient informed chaperone available to be present for breast and pelvic exam. Patient has requested no chaperone to be present. Patient has been advised what will be completed during breast and pelvic exam.   Assessment/Plan:  66 y.o. G2P0002 for annual exam.   Well female exam with routine gynecological exam - Education provided on SBEs, importance of preventative screenings, current guidelines, high calcium diet, regular exercise, and multivitamin daily. Labs with PCP.   Osteopenia of multiple sites - T-score -1.14 May 2018. Taking daily vitamin D + calcium supplement and exercising regularly. Recommend repeating DXA, she does this through PCP's office.   Menopausal vaginal dryness - Plan: estradiol (ESTRACE VAGINAL) 0.1 MG/GM vaginal cream twice weekly. She stopped Estring about 6 months ago (was also using for migraine management). Dryness is bothersome most days. She is aware of risk of small amount of systemic absorption increasing her risk for blood clots, heart attack, stroke, and breast cancer.   Screening for cervical cancer - Normal Pap history. Will repeat at 5-year  interval per guidelines.   Screening for breast cancer - Normal mammogram history.  Continue annual screenings. Normal breast exam today.  Screening for colon cancer - Screening colonoscopy in 2018, polyps. Will repeat at 5-year interval per GI's recommendations.   Follow up in 1 year for annual.    Tamela Gammon [facility], 9:36 AM 05/22/2021

## 2021-05-22 ENCOUNTER — Other Ambulatory Visit: Payer: Self-pay

## 2021-05-22 ENCOUNTER — Encounter: Payer: Self-pay | Admitting: Nurse Practitioner

## 2021-05-22 ENCOUNTER — Ambulatory Visit (INDEPENDENT_AMBULATORY_CARE_PROVIDER_SITE_OTHER): Payer: Federal, State, Local not specified - PPO | Admitting: Nurse Practitioner

## 2021-05-22 VITALS — BP 118/76 | Ht 64.0 in | Wt 137.0 lb

## 2021-05-22 DIAGNOSIS — Z01419 Encounter for gynecological examination (general) (routine) without abnormal findings: Secondary | ICD-10-CM

## 2021-05-22 DIAGNOSIS — N951 Menopausal and female climacteric states: Secondary | ICD-10-CM | POA: Diagnosis not present

## 2021-05-22 DIAGNOSIS — Z780 Asymptomatic menopausal state: Secondary | ICD-10-CM

## 2021-05-22 DIAGNOSIS — M8589 Other specified disorders of bone density and structure, multiple sites: Secondary | ICD-10-CM | POA: Diagnosis not present

## 2021-05-22 DIAGNOSIS — G43009 Migraine without aura, not intractable, without status migrainosus: Secondary | ICD-10-CM

## 2021-05-22 MED ORDER — ESTRADIOL 0.1 MG/GM VA CREA: 1.0000 | TOPICAL_CREAM | VAGINAL | 12 refills | Status: DC

## 2021-06-06 ENCOUNTER — Encounter: Payer: Self-pay | Admitting: Family

## 2021-06-06 DIAGNOSIS — Z0000 Encounter for general adult medical examination without abnormal findings: Secondary | ICD-10-CM

## 2021-06-06 DIAGNOSIS — M8580 Other specified disorders of bone density and structure, unspecified site: Secondary | ICD-10-CM

## 2021-06-11 ENCOUNTER — Encounter (HOSPITAL_BASED_OUTPATIENT_CLINIC_OR_DEPARTMENT_OTHER): Payer: Self-pay

## 2021-06-11 ENCOUNTER — Ambulatory Visit (HOSPITAL_BASED_OUTPATIENT_CLINIC_OR_DEPARTMENT_OTHER)
Admission: RE | Admit: 2021-06-11 | Discharge: 2021-06-11 | Disposition: A | Discharge status: Home / Self Care | Payer: Federal, State, Local not specified - PPO | Source: Ambulatory Visit | Attending: Family | Admitting: Family

## 2021-06-11 ENCOUNTER — Other Ambulatory Visit: Payer: Self-pay

## 2021-06-11 DIAGNOSIS — M8589 Other specified disorders of bone density and structure, multiple sites: Secondary | ICD-10-CM | POA: Diagnosis not present

## 2021-06-11 DIAGNOSIS — M8580 Other specified disorders of bone density and structure, unspecified site: Secondary | ICD-10-CM

## 2021-06-11 DIAGNOSIS — Z1231 Encounter for screening mammogram for malignant neoplasm of breast: Secondary | ICD-10-CM | POA: Diagnosis not present

## 2021-07-29 ENCOUNTER — Ambulatory Visit (INDEPENDENT_AMBULATORY_CARE_PROVIDER_SITE_OTHER): Payer: Federal, State, Local not specified - PPO | Admitting: Family

## 2021-07-29 ENCOUNTER — Encounter: Payer: Self-pay | Admitting: Family

## 2021-07-29 ENCOUNTER — Telehealth: Payer: Self-pay | Admitting: Family

## 2021-07-29 VITALS — BP 128/80 | HR 77 | Temp 98.2°F | Resp 16 | Ht 64.0 in | Wt 132.0 lb

## 2021-07-29 DIAGNOSIS — E559 Vitamin D deficiency, unspecified: Secondary | ICD-10-CM

## 2021-07-29 DIAGNOSIS — E785 Hyperlipidemia, unspecified: Secondary | ICD-10-CM | POA: Diagnosis not present

## 2021-07-29 DIAGNOSIS — Z0000 Encounter for general adult medical examination without abnormal findings: Secondary | ICD-10-CM

## 2021-07-29 DIAGNOSIS — Z1211 Encounter for screening for malignant neoplasm of colon: Secondary | ICD-10-CM

## 2021-07-29 DIAGNOSIS — Z7185 Encounter for immunization safety counseling: Secondary | ICD-10-CM | POA: Diagnosis not present

## 2021-07-29 DIAGNOSIS — Z0001 Encounter for general adult medical examination with abnormal findings: Secondary | ICD-10-CM | POA: Insufficient documentation

## 2021-07-29 DIAGNOSIS — G43009 Migraine without aura, not intractable, without status migrainosus: Secondary | ICD-10-CM

## 2021-07-29 DIAGNOSIS — Z23 Encounter for immunization: Secondary | ICD-10-CM

## 2021-07-29 LAB — COMPREHENSIVE METABOLIC PANEL
ALT: 16 U/L (ref 0–35)
AST: 19 U/L (ref 0–37)
Albumin: 4.5 g/dL (ref 3.5–5.2)
Alkaline Phosphatase: 80 U/L (ref 39–117)
BUN: 13 mg/dL (ref 6–23)
CO2: 32 mEq/L (ref 19–32)
Calcium: 9.4 mg/dL (ref 8.4–10.5)
Chloride: 100 mEq/L (ref 96–112)
Creatinine, Ser: 0.86 mg/dL (ref 0.40–1.20)
GFR: 71.68 mL/min (ref >60.00)
Glucose, Bld: 90 mg/dL (ref 70–99)
Potassium: 4.4 mEq/L (ref 3.5–5.1)
Sodium: 138 mEq/L (ref 135–145)
Total Bilirubin: 0.7 mg/dL (ref 0.2–1.2)
Total Protein: 6.8 g/dL (ref 6.0–8.3)

## 2021-07-29 LAB — LIPID PANEL
Cholesterol: 220 mg/dL — ABNORMAL HIGH (ref 0–200)
HDL: 52.1 mg/dL (ref >39.00)
LDL Cholesterol: 140 mg/dL — ABNORMAL HIGH (ref 0–99)
NonHDL: 167.76
Total CHOL/HDL Ratio: 4
Triglycerides: 140 mg/dL (ref 0.0–149.0)
VLDL: 28 mg/dL (ref 0.0–40.0)

## 2021-07-29 LAB — VITAMIN D 25 HYDROXY (VIT D DEFICIENCY, FRACTURES): VITD: 18.2 ng/mL — ABNORMAL LOW (ref 30.00–100.00)

## 2021-07-29 MED ORDER — VITAMIN D (ERGOCALCIFEROL) 1.25 MG (50000 UNIT) PO CAPS: 50000.0000 [IU] | ORAL_CAPSULE | ORAL | 0 refills | Status: DC

## 2021-07-29 MED ORDER — TYPHOID VACCINE PO CPDR: DELAYED_RELEASE_CAPSULE | ORAL | 0 refills | Status: DC

## 2021-07-29 MED ORDER — EPINEPHRINE 0.3 MG/0.3ML IJ SOAJ: 0.3000 mg | INTRAMUSCULAR | 1 refills | Status: DC | PRN

## 2021-07-29 NOTE — Telephone Encounter (Signed)
Vitamin D level is low.  Advise patient to begin vit D 50000 units once weekly for 12 weeks, then repeat vit D level (dx Vit D deficiency).   ? ?Cholesterol is mildly elevated. Please continue to work on low cholesterol diet.  ?

## 2021-07-29 NOTE — Assessment & Plan Note (Signed)
She will be traveling to [place] in December for her son's wedding.  Recommended hep A (first dose given today) and vivotif to be taken at least 1 week prior to her trip.  ?

## 2021-07-29 NOTE — Progress Notes (Signed)
? ?Subjective:  ? ? ? Patient ID: Joanna Lambert, female    DOB: 1955-06-07, 66 y.o.   MRN: [id number] ? ?No chief complaint on file. ? ? ?HPI ?Patient is in today for cpx. ? ?Immunizations: pfizer x 4,(reports 4th shot was the bivalent) Tetanus 2013, ? flu ?Diet: healthy ?Wt Readings from Last 3 Encounters:  ?07/29/21 132 lb (59.9 kg)  ?05/22/21 137 lb (62.1 kg)  ?01/25/21 136 lb (61.7 kg)  ? ? ?Exercise: walking, yoga, weights ?Colonoscopy: 06/23/16 due for 5 yr follow up ?Dexa:1/23 ?Pap Smear: 02/22/2020 ?Mammogram: 06/11/2021 ?Vision: up to date ?Dental: up to date ? ?Health Maintenance Due  ?Topic Date Due  ? HIV Screening  Never done  ? COVID-19 Vaccine (3 - Booster for Pfizer series) 04/18/2020  ? PAP SMEAR-Modifier  03/23/2021  ? TETANUS/TDAP  05/12/2021  ? COLONOSCOPY (Pts 45-63yr Insurance coverage will need to be confirmed)  06/23/2021  ? ? ?Past Medical History:  ?Diagnosis Date  ? Allergy   ? seasonal  ? Migraines   ? Ovarian cyst   ? right  ? Restless legs   ? Varicose veins   ? Vitamin D deficiency 12/26/2014  ? ? ?Past Surgical History:  ?Procedure Laterality Date  ? APPENDECTOMY    ? BREAST BIOPSY    ? BREAST SURGERY Right 2012  ? biopsy, benign cyst  ? CARPAL TUNNEL RELEASE Bilateral 2010  ? FOOT SURGERY Right 2011  ? bunion removal  ? ? ?Family History  ?Problem Relation Age of Onset  ? Diabetes Mother   ? Hypertension Mother 688 ? Hyperlipidemia Mother   ? Heart disease Mother   ?     Aortic stenosis  ? Heart disease Father 737 ?     pacemaker  ? Atrial fibrillation Father 775 ? Peripheral vascular disease Father   ?     s/p Carotid Edartarectomy  ? Dementia Father   ? Colon cancer Neg Hx   ? ? ?Social History  ? ?Socioeconomic History  ? Marital status: Married  ?  Spouse name: Not on file  ? Number of children: Not on file  ? Years of education: Not on file  ? Highest education level: Not on file  ?Occupational History  ? Not on file  ?Tobacco Use  ? Smoking status: Never  ? Smokeless tobacco: Never   ?Vaping Use  ? Vaping Use: Never used  ?Substance and Sexual Activity  ? Alcohol use: Yes  ?  Comment: once a month per pt  ? Drug use: No  ? Sexual activity: Yes  ?  Birth control/protection: Post-menopausal  ?  Comment: 1st intercourse 66yo-Fewer than 5 partners  ?Other Topics Concern  ? Not on file  ?Social History Narrative  ? Married  ? 2 children (daughter lives here- she has two sons)  ? Son lives in [place] ? 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    ? ?Social Determinants of Health  ? ?Financial Resource Strain: Not on file  ?Food Insecurity: Not on file  ?Transportation Needs: Not on file  ?Physical Activity: Not on file  ?Stress: Not on file  ?Social Connections: Not on file  ?Intimate Partner Violence: Not on file  ? ? ?Outpatient Medications Prior to Visit  ?Medication Sig Dispense Refill  ? aspirin 81 MG tablet Take 81 mg by mouth daily.    ? Biotin 5000 MCG CAPS Take by mouth.    ?  calcium carbonate (OS-CAL - DOSED IN MG OF ELEMENTAL CALCIUM) 1250 (500 Ca) MG tablet Take by mouth.    ? clotrimazole-betamethasone (LOTRISONE) cream APPLY EXTERNALLY TO THE AFFECTED AREA TWICE DAILY 45 g 1  ? metroNIDAZOLE (METROCREAM) 0.75 % cream Apply 1 application topically daily.  11  ? Minoxidil 5 % FOAM Apply topically.    ? rizatriptan (MAXALT) 10 MG tablet TAKE BY MOUTH AS DIRECTED MAY REPEAT IN 2 HOURS IF NEEDED 10 tablet 3  ? EPINEPHrine (EPIPEN 2-PAK) 0.3 mg/0.3 mL IJ SOAJ injection Inject 0.3 mLs (0.3 mg total) into the muscle as needed for anaphylaxis. 1 each 1  ? estradiol (ESTRACE VAGINAL) 0.1 MG/GM vaginal cream Place 1 Applicatorful vaginally 2 (two) times a week. 42.5 g 12  ? ?No facility-administered medications prior to visit.  ? ? ?Allergies  ?Allergen Reactions  ? Bee Venom   ?  Local swelling  ? Adhesive [Tape] Rash  ? ? ?Review of Systems  ?Constitutional:  Negative for fever.  ?HENT:  Negative for congestion and hearing loss.   ?Eyes:   Negative for blurred vision.  ?Respiratory:  Negative for cough.   ?Cardiovascular:  Negative for leg swelling.  ?Gastrointestinal:  Negative for constipation and diarrhea.  ?Genitourinary:  Negative for dysuria and frequency.  ?     Stress incontinence  ?Musculoskeletal:  Negative for joint pain and myalgias.  ?Skin:  Negative for rash.  ?Neurological:  Positive for headaches (2 migraines a month, can last 2-3 days).  ?Psychiatric/Behavioral:  Negative for depression.   ? ?   ?Objective:  ?  ?Physical Exam ? ?BP 128/80 (BP Location: Left Arm, Patient Position: Sitting, Cuff Size: Small)   Pulse 77   Temp 98.2 ?F (36.8 ?C) (Oral)   Resp 16   Ht '5\' 4"'  (1.626 m)   Wt 132 lb (59.9 kg)   LMP 05/12/2013   SpO2 100%   BMI 22.66 kg/m?  ?Wt Readings from Last 3 Encounters:  ?07/29/21 132 lb (59.9 kg)  ?05/22/21 137 lb (62.1 kg)  ?01/25/21 136 lb (61.7 kg)  ? ?Physical Exam  ?Constitutional: She is oriented to person, place, and time. She appears well-developed and well-nourished. No distress.  ?HENT:  ?Head: Normocephalic and atraumatic.  ?Right Ear: Tympanic membrane and ear canal normal.  ?Left Ear: Tympanic membrane and ear canal normal.  ?Mouth/Throat: Not examined- pt wearing mask ?Eyes: Pupils are equal, round, and reactive to light. No scleral icterus.  ?Neck: Normal range of motion. No thyromegaly present.  ?Cardiovascular: Normal rate and regular rhythm.   ?No murmur heard. ?Pulmonary/Chest: Effort normal and breath sounds normal. No respiratory distress. He has no wheezes. She has no rales. She exhibits no tenderness.  ?Abdominal: Soft. Bowel sounds are normal. She exhibits no distension and no mass. There is no tenderness. There is no rebound and no guarding.  ?Musculoskeletal: She exhibits no edema.  ?Lymphadenopathy:  ?  She has no cervical adenopathy.  ?Neurological: She is alert and oriented to person, place, and time. She has normal patellar reflexes. She exhibits normal muscle tone. Coordination  normal.  ?Skin: Skin is warm and dry.  ?Psychiatric: She has a normal mood and affect. Her behavior is normal. Judgment and thought content normal.  ?Breast/pelvic: deferred ? ? ? ? ? ?    ?Assessment & Plan:  ? ? ? ?   ?Assessment & Plan:  ? ?Problem List Items Addressed This Visit   ? ?  ? Unprioritized  ? Vitamin D deficiency  ?  Taking caltrate. Not using separate vit D supplement. Will check follow up level.  ?  ?  ? Relevant Orders  ? Vitamin D (25 hydroxy)  ? Preventative health care - Primary  ?  She will send me a copy of her covid vaccination. Pap/mammo up to date. Colo due- order placed. Had bone density last year.  Td today.  Hep A #1 today.  ?  ?  ? Migraines  ?  Uncontrolled. Will refer to neurology for further evaluation.  ?  ?  ? Relevant Medications  ? EPINEPHrine (EPIPEN 2-PAK) 0.3 mg/0.3 mL IJ SOAJ injection  ? Other Relevant Orders  ? Ambulatory referral to Neurology  ? Immunization counseling  ?  She will be traveling to [place] in December for her son's wedding.  Recommended hep A (first dose given today) and vivotif to be taken at least 1 week prior to her trip.  ?  ?  ? ?Other Visit Diagnoses   ? ? Hyperlipidemia, unspecified hyperlipidemia type      ? Relevant Medications  ? EPINEPHrine (EPIPEN 2-PAK) 0.3 mg/0.3 mL IJ SOAJ injection  ? Other Relevant Orders  ? Comp Met (CMET)  ? Lipid panel  ? Colon cancer screening      ? Relevant Orders  ? Ambulatory referral to Gastroenterology  ? ?  ? ? ?I have discontinued Yashika Garro's estradiol. I have also changed her EPINEPHrine. Additionally, I am having her start on typhoid. Lastly, I am having her maintain her aspirin, calcium carbonate, metroNIDAZOLE, clotrimazole-betamethasone, Biotin, Minoxidil, and rizatriptan. ? ?Meds ordered this encounter  ?Medications  ? typhoid (VIVOTIF) DR capsule  ?  Sig: One capsule on alternate days (day 1, 3, 5, and 7) for a total of 4 doses; all doses should be complete at least 1 week prior to potential  exposure  ?  Dispense:  4 capsule  ?  Refill:  0  ?  Please place on hold  ?  Order Specific Question:   Supervising Provider  ?  Answer:   Penni Homans A [[id number]]  ? EPINEPHrine (EPIPEN 2-PAK) 0.3 mg/0.3 mL IJ SOAJ injecti

## 2021-07-29 NOTE — Patient Instructions (Signed)
Please complete lab work prior to leaving. ?Continue your work on [employer], exercise. ?Begin the typhoid pills at least 1 week prior to your trip:  One capsule on alternate days (day 1, 3, 5, and 7) for a total of 4 doses; all doses should be complete at least 1 week prior to potential exposure  ?

## 2021-07-29 NOTE — Addendum Note (Signed)
Addended by: Jiles Prows on: 07/29/2021 08:36 AM ? ? Modules accepted: Orders ? ?

## 2021-07-29 NOTE — Assessment & Plan Note (Signed)
She will send me a copy of her covid vaccination. Pap/mammo up to date. Colo due- order placed. Had bone density last year.  Td today.  Hep A #1 today.  ?

## 2021-07-29 NOTE — Assessment & Plan Note (Signed)
Uncontrolled. Will refer to neurology for further evaluation.  ?

## 2021-07-29 NOTE — Telephone Encounter (Signed)
Patient advised of results, provider's advise, new rx and scheduled to come back for Vit D check 10-29-21 ?

## 2021-07-29 NOTE — Assessment & Plan Note (Signed)
Taking caltrate. Not using separate vit D supplement. Will check follow up level.  ?

## 2021-07-30 ENCOUNTER — Encounter: Payer: Self-pay | Admitting: Neurology

## 2021-08-06 ENCOUNTER — Encounter: Payer: Self-pay | Admitting: Gastroenterology

## 2021-09-04 ENCOUNTER — Ambulatory Visit (AMBULATORY_SURGERY_CENTER): Payer: Federal, State, Local not specified - PPO | Admitting: *Deleted

## 2021-09-04 VITALS — Ht 64.0 in | Wt 132.0 lb

## 2021-09-04 DIAGNOSIS — Z86010 Personal history of colonic polyps: Secondary | ICD-10-CM

## 2021-09-04 MED ORDER — NA SULFATE-K SULFATE-MG SULF 17.5-3.13-1.6 GM/177ML PO SOLN: 1.0000 | ORAL | 0 refills | Status: DC

## 2021-09-04 NOTE — Progress Notes (Signed)
Patient's pre-visit was done today over the phone with the patient. Name,DOB and address verified. Patient denies any allergies to Eggs and Soy. Patient denies any problems with anesthesia/sedation. Patient is not taking any diet pills or blood thinners. No home Oxygen.  ?Prep instructions sent to pt's MyChart (if available) -pt is aware. Patient understands to call us back with any questions or concerns. Patient is aware of our care-partner policy.  ? ?EMMI education assigned to the patient for the procedure, sent to [place].  ? ?The patient is COVID-19 vaccinated.   ?

## 2021-09-18 ENCOUNTER — Encounter: Payer: Self-pay | Admitting: Gastroenterology

## 2021-09-18 ENCOUNTER — Ambulatory Visit (AMBULATORY_SURGERY_CENTER): Payer: Federal, State, Local not specified - PPO | Admitting: Gastroenterology

## 2021-09-18 VITALS — BP 123/70 | HR 71 | Temp 97.1°F | Resp 10 | Ht 64.0 in | Wt 132.0 lb

## 2021-09-18 DIAGNOSIS — Z86010 Personal history of colonic polyps: Secondary | ICD-10-CM | POA: Diagnosis not present

## 2021-09-18 DIAGNOSIS — D123 Benign neoplasm of transverse colon: Secondary | ICD-10-CM | POA: Diagnosis not present

## 2021-09-18 DIAGNOSIS — Z1211 Encounter for screening for malignant neoplasm of colon: Secondary | ICD-10-CM | POA: Diagnosis not present

## 2021-09-18 HISTORY — PX: COLONOSCOPY WITH PROPOFOL: SHX5780

## 2021-09-18 MED ORDER — SODIUM CHLORIDE 0.9 % IV SOLN: 500.0000 mL | Freq: Once | INTRAVENOUS | Status: DC

## 2021-09-18 NOTE — Progress Notes (Signed)
Called to room to assist during endoscopic procedure.  Patient ID and intended procedure confirmed with present staff. Received instructions for my participation in the procedure from the performing physician.  

## 2021-09-18 NOTE — Op Note (Signed)
[facility] ?Patient Name: Joanna Lambert ?Procedure Date: 09/18/2021 8:44 AM ?MRN: [id number] ?Endoscopist: Mauri Pole , MD ?Age: 66 ?Referring MD:  ?Date of Birth: September 05, 1955 ?Gender: Female ?Account #: [id number] ?Procedure:                Colonoscopy ?Indications:              High risk colon cancer surveillance: Personal  ?                          history of colonic polyps ?Medicines:                Monitored Anesthesia Care ?Procedure:                Pre-Anesthesia Assessment: ?                          - Prior to the procedure, a History and Physical  ?                          was performed, and patient medications and  ?                          allergies were reviewed. The patient's tolerance of  ?                          previous anesthesia was also reviewed. The risks  ?                          and benefits of the procedure and the sedation  ?                          options and risks were discussed with the patient.  ?                          All questions were answered, and informed consent  ?                          was obtained. Prior Anticoagulants: The patient has  ?                          taken no previous anticoagulant or antiplatelet  ?                          agents. ASA Grade Assessment: II - A patient with  ?                          mild systemic disease. After reviewing the risks  ?                          and benefits, the patient was deemed in  ?                          satisfactory condition to undergo the procedure. ?  After obtaining informed consent, the colonoscope  ?                          was passed under direct vision. Throughout the  ?                          procedure, the patient's blood pressure, pulse, and  ?                          oxygen saturations were monitored continuously. The  ?                          Olympus PCF-H190DL (#[id number]) Colonoscope was  ?                          introduced through the anus and  advanced to the the  ?                          cecum, identified by appendiceal orifice and  ?                          ileocecal valve. The colonoscopy was performed  ?                          without difficulty. The patient tolerated the  ?                          procedure well. The quality of the bowel  ?                          preparation was excellent. The ileocecal valve,  ?                          appendiceal orifice, and rectum were photographed. ?Scope In: 8:49:26 AM ?Scope Out: 9:02:58 AM ?Scope Withdrawal Time: 0 hours 7 minutes 56 seconds  ?Total Procedure Duration: 0 hours 13 minutes 32 seconds  ?Findings:                 The perianal and digital rectal examinations were  ?                          normal. ?                          A 5 mm polyp was found in the transverse colon. The  ?                          polyp was sessile. The polyp was removed with a  ?                          cold snare. Resection and retrieval were complete. ?                          Non-bleeding external and internal hemorrhoids were  ?  found during retroflexion. The hemorrhoids were  ?                          medium-sized. ?Complications:            No immediate complications. ?Estimated Blood Loss:     Estimated blood loss was minimal. ?Impression:               - One 5 mm polyp in the transverse colon, removed  ?                          with a cold snare. Resected and retrieved. ?                          - Non-bleeding external and internal hemorrhoids. ?Recommendation:           - Patient has a contact number available for  ?                          emergencies. The signs and symptoms of potential  ?                          delayed complications were discussed with the  ?                          patient. Return to normal activities tomorrow.  ?                          Written discharge instructions were provided to the  ?                          patient. ?                           - Resume previous diet. ?                          - Continue present medications. ?                          - Await pathology results. ?                          - Repeat colonoscopy in 5-10 years for surveillance  ?                          based on pathology results. ?Mauri Pole, MD ?09/18/2021 9:07:36 AM ?This report has been signed electronically. ?

## 2021-09-18 NOTE — Progress Notes (Signed)
Pt's states no medical or surgical changes since previsit or office visit. 

## 2021-09-18 NOTE — Progress Notes (Signed)
[facility] History and Physical ? ? ?Primary Care Physician:  Debbrah Alar, NP ? ? ?Reason for Procedure:  History of adenomatous colon polyps ? ?Plan:    Surveillance colonoscopy with possible interventions as needed ? ? ? ? ?HPI: Joanna Lambert is a very pleasant 66 y.o. female here for surveillance colonoscopy. ?Denies any nausea, vomiting, abdominal pain, melena or bright red blood per rectum ? ?The risks and benefits as well as alternatives of endoscopic procedure(s) have been discussed and reviewed. All questions answered. The patient agrees to proceed. ? ? ? ?Past Medical History:  ?Diagnosis Date  ? Allergy   ? seasonal  ? Heart murmur   ? Migraines   ? Ovarian cyst   ? right  ? Restless legs   ? Varicose veins   ? Vitamin D deficiency 12/26/2014  ? ? ?Past Surgical History:  ?Procedure Laterality Date  ? APPENDECTOMY    ? BREAST BIOPSY    ? BREAST SURGERY Right 2012  ? biopsy, benign cyst  ? CARPAL TUNNEL RELEASE Bilateral 2010  ? COLONOSCOPY  06/13/2016  ? Dr.Rito Lecomte  ? FOOT SURGERY Right 2011  ? bunion removal  ? POLYPECTOMY    ? ? ?Prior to Admission medications   ?Medication Sig Start Date End Date Taking? Authorizing Provider  ?aspirin 81 MG tablet Take 81 mg by mouth daily.   Yes [provider]  ?Biotin 5000 MCG CAPS Take by mouth.   Yes [provider]  ?calcium carbonate (OS-CAL - DOSED IN MG OF ELEMENTAL CALCIUM) 1250 (500 Ca) MG tablet Take by mouth.   Yes [provider]  ?Flaxseed, Linseed, (FLAX SEED OIL PO) Take by mouth.   Yes [provider]  ?rizatriptan (MAXALT) 10 MG tablet TAKE BY MOUTH AS DIRECTED MAY REPEAT IN 2 HOURS IF NEEDED 01/22/21  Yes Marny Lowenstein A, NP  ?clotrimazole-betamethasone (LOTRISONE) cream APPLY EXTERNALLY TO THE AFFECTED AREA TWICE DAILY 12/04/17   Huel Cote, NP  ?EPINEPHrine (EPIPEN 2-PAK) 0.3 mg/0.3 mL IJ SOAJ injection Inject 0.3 mg into the muscle as needed for anaphylaxis. ?Patient not taking:  Reported on 09/04/2021 07/29/21   Debbrah Alar, NP  ?metroNIDAZOLE (METROCREAM) 0.75 % cream Apply 1 application topically daily. 04/28/16   [provider]  ?Minoxidil 5 % FOAM Apply topically.    [provider]  ?typhoid (VIVOTIF) DR capsule One capsule on alternate days (day 1, 3, 5, and 7) for a total of 4 doses; all doses should be complete at least 1 week prior to potential exposure ?Patient not taking: Reported on 09/04/2021 07/29/21   Debbrah Alar, NP  ?Vitamin D, Ergocalciferol, (DRISDOL) 1.25 MG (50000 UNIT) CAPS capsule Take 1 capsule (50,000 Units total) by mouth every 7 (seven) days. 07/29/21   Debbrah Alar, NP  ? ? ?Current Outpatient Medications  ?Medication Sig Dispense Refill  ? aspirin 81 MG tablet Take 81 mg by mouth daily.    ? Biotin 5000 MCG CAPS Take by mouth.    ? calcium carbonate (OS-CAL - DOSED IN MG OF ELEMENTAL CALCIUM) 1250 (500 Ca) MG tablet Take by mouth.    ? Flaxseed, Linseed, (FLAX SEED OIL PO) Take by mouth.    ? rizatriptan (MAXALT) 10 MG tablet TAKE BY MOUTH AS DIRECTED MAY REPEAT IN 2 HOURS IF NEEDED 10 tablet 3  ? clotrimazole-betamethasone (LOTRISONE) cream APPLY EXTERNALLY TO THE AFFECTED AREA TWICE DAILY 45 g 1  ? EPINEPHrine (EPIPEN 2-PAK) 0.3 mg/0.3 mL IJ SOAJ injection Inject 0.3 mg  into the muscle as needed for anaphylaxis. (Patient not taking: Reported on 09/04/2021) 1 each 1  ? metroNIDAZOLE (METROCREAM) 0.75 % cream Apply 1 application topically daily.  11  ? Minoxidil 5 % FOAM Apply topically.    ? typhoid (VIVOTIF) DR capsule One capsule on alternate days (day 1, 3, 5, and 7) for a total of 4 doses; all doses should be complete at least 1 week prior to potential exposure (Patient not taking: Reported on 09/04/2021) 4 capsule 0  ? Vitamin D, Ergocalciferol, (DRISDOL) 1.25 MG (50000 UNIT) CAPS capsule Take 1 capsule (50,000 Units total) by mouth every 7 (seven) days. 12 capsule 0  ? ?Current Facility-Administered Medications   ?Medication Dose Route Frequency Provider Last Rate Last Admin  ? 0.9 %  sodium chloride infusion  500 mL Intravenous Once Seldon Barrell, Venia Minks, MD      ? ? ?Allergies as of 09/18/2021 - Review Complete 09/18/2021  ?Allergen Reaction Noted  ? Bee venom  08/06/2016  ? Adhesive [tape] Rash 09/04/2014  ? ? ?Family History  ?Problem Relation Age of Onset  ? Diabetes Mother   ? Hypertension Mother 61  ? Hyperlipidemia Mother   ? Heart disease Mother   ?     Aortic stenosis  ? Heart disease Father 68  ?     pacemaker  ? Atrial fibrillation Father 47  ? Peripheral vascular disease Father   ?     s/p Carotid Edartarectomy  ? Dementia Father   ? Colon cancer Neg Hx   ? Colon polyps Neg Hx   ? Esophageal cancer Neg Hx   ? Stomach cancer Neg Hx   ? Rectal cancer Neg Hx   ? ? ?Social History  ? ?Socioeconomic History  ? Marital status: Married  ?  Spouse name: Not on file  ? Number of children: Not on file  ? Years of education: Not on file  ? Highest education level: Not on file  ?Occupational History  ? Not on file  ?Tobacco Use  ? Smoking status: Never  ? Smokeless tobacco: Never  ?Vaping Use  ? Vaping Use: Never used  ?Substance and Sexual Activity  ? Alcohol use: Yes  ?  Comment: once a month per pt-wine  ? Drug use: No  ? Sexual activity: Yes  ?  Birth control/protection: Post-menopausal  ?  Comment: 1st intercourse 66 yo-Fewer than 5 partners  ?Other Topics Concern  ? Not on file  ?Social History Narrative  ? Married  ? 2 children (daughter lives here- she has two sons)  ? Son lives in [place]  ? Retired from Teaching laboratory technician (personel assisting users)  ? 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    ? ?Social Determinants of Health  ? ?Financial Resource Strain: Not on file  ?Food Insecurity: Not on file  ?Transportation Needs: Not on file  ?Physical Activity: Not on file  ?Stress: Not on file  ?Social Connections: Not on file  ?Intimate Partner Violence: Not on file  ? ? ?Review of Systems: ? ?All other review of systems negative  except as mentioned in the HPI. ? ?Physical Exam: ?Vital signs in last 24 hours: ?BP 131/87   Pulse 72   Temp (!) 97.1 ?F (36.2 ?C)   Ht '5\' 4"'$  (1.626 m)   Wt 132 lb (59.9 kg)   LMP 05/12/2013   SpO2 99%   BMI 22.66 kg/m?  ?General:   Alert, NAD ?Lungs:  Clear .   ?Heart:  Regular rate and rhythm ?Abdomen:  Soft, nontender and nondistended. ?Neuro/Psych:  Alert and cooperative. Normal mood and affect. A and O x 3 ? ?Reviewed labs, radiology imaging, old records and pertinent past GI work up ? ?Patient is appropriate for planned procedure(s) and anesthesia in an ambulatory setting ? ? ?K. Denzil Magnuson , MD ?[phone]  ? ? ?  ?

## 2021-09-18 NOTE — Progress Notes (Signed)
Report to PACU, RN, vss, BBS= Clear.  

## 2021-09-18 NOTE — Patient Instructions (Signed)
YOU HAD AN ENDOSCOPIC PROCEDURE TODAY AT THE [facility]:   Refer to the procedure report that was given to you for any specific questions about what was found during the examination.  If the procedure report does not answer your questions, please call your gastroenterologist to clarify.  If you requested that your care partner not be given the details of your procedure findings, then the procedure report has been included in a sealed envelope for you to review at your convenience later.  YOU SHOULD EXPECT: Some feelings of bloating in the abdomen. Passage of more gas than usual.  Walking can help get rid of the air that was put into your GI tract during the procedure and reduce the bloating. If you had a lower endoscopy (such as a colonoscopy or flexible sigmoidoscopy) you may notice spotting of blood in your stool or on the toilet paper. If you underwent a bowel prep for your procedure, you may not have a normal bowel movement for a few days.  Please Note:  You might notice some irritation and congestion in your nose or some drainage.  This is from the oxygen used during your procedure.  There is no need for concern and it should clear up in a day or so.  SYMPTOMS TO REPORT IMMEDIATELY:   Following lower endoscopy (colonoscopy or flexible sigmoidoscopy):  Excessive amounts of blood in the stool  Significant tenderness or worsening of abdominal pains  Swelling of the abdomen that is new, acute  Fever of 100F or higher   Following upper endoscopy (EGD)  Vomiting of blood or coffee ground material  New chest pain or pain under the shoulder blades  Painful or persistently difficult swallowing  New shortness of breath  Fever of 100F or higher  Black, tarry-looking stools  For urgent or emergent issues, a gastroenterologist can be reached at any hour by calling [phone]. Do not use MyChart messaging for urgent concerns.    DIET:  We do recommend a small meal at first, but  then you may proceed to your regular diet.  Drink plenty of fluids but you should avoid alcoholic beverages for 24 hours.  ACTIVITY:  You should plan to take it easy for the rest of today and you should NOT DRIVE or use heavy machinery until tomorrow (because of the sedation medicines used during the test).    FOLLOW UP: Our staff will call the number listed on your records 48-72 hours following your procedure to check on you and address any questions or concerns that you may have regarding the information given to you following your procedure. If we do not reach you, we will leave a message.  We will attempt to reach you two times.  During this call, we will ask if you have developed any symptoms of COVID 19. If you develop any symptoms (ie: fever, flu-like symptoms, shortness of breath, cough etc.) before then, please call [phone].  If you test positive for Covid 19 in the 2 weeks post procedure, please call and report this information to us.    If any biopsies were taken you will be contacted by phone or by letter within the next 1-3 weeks.  Please call us at [phone] if you have not heard about the biopsies in 3 weeks.    SIGNATURES/CONFIDENTIALITY: You and/or your care partner have signed paperwork which will be entered into your electronic medical record.  These signatures attest to the fact that that the information above on   your After Visit Summary has been reviewed and is understood.  Full responsibility of the confidentiality of this discharge information lies with you and/or your care-partner. 

## 2021-09-19 ENCOUNTER — Encounter: Payer: Self-pay | Admitting: Gastroenterology

## 2021-09-20 ENCOUNTER — Encounter: Payer: Self-pay | Admitting: Gastroenterology

## 2021-09-20 ENCOUNTER — Telehealth: Payer: Self-pay | Admitting: *Deleted

## 2021-09-20 NOTE — Telephone Encounter (Signed)
?  Follow up Call- ? ? ?  09/18/2021  ?  7:37 AM  ?Call back number  ?Post procedure Call Back phone  # [phone] cell  ?Permission to leave phone message Yes  ?  ? ?Patient questions: ? ?Do you have a fever, pain , or abdominal swelling? No. ?Pain Score  0 * ? ?Have you tolerated food without any problems? Yes.   ? ?Have you been able to return to your normal activities? Yes.   ? ?Do you have any questions about your discharge instructions: ?Diet   No. ?Medications  No. ?Follow up visit  No. ? ?Do you have questions or concerns about your Care? No. ? ?Actions: ?* If pain score is 4 or above: ?No action needed, pain <4. ? ? ?

## 2021-09-25 ENCOUNTER — Encounter: Payer: Self-pay | Admitting: Family

## 2021-09-25 DIAGNOSIS — E785 Hyperlipidemia, unspecified: Secondary | ICD-10-CM

## 2021-10-29 ENCOUNTER — Other Ambulatory Visit (INDEPENDENT_AMBULATORY_CARE_PROVIDER_SITE_OTHER): Payer: Federal, State, Local not specified - PPO

## 2021-10-29 ENCOUNTER — Other Ambulatory Visit: Payer: Federal, State, Local not specified - PPO

## 2021-10-29 DIAGNOSIS — E785 Hyperlipidemia, unspecified: Secondary | ICD-10-CM | POA: Diagnosis not present

## 2021-10-29 LAB — LIPID PANEL
Cholesterol: 232 mg/dL — ABNORMAL HIGH (ref 0–200)
HDL: 56.7 mg/dL (ref >39.00)
LDL Cholesterol: 153 mg/dL — ABNORMAL HIGH (ref 0–99)
NonHDL: 174.9
Total CHOL/HDL Ratio: 4
Triglycerides: 110 mg/dL (ref 0.0–149.0)
VLDL: 22 mg/dL (ref 0.0–40.0)

## 2021-10-30 ENCOUNTER — Other Ambulatory Visit: Payer: Federal, State, Local not specified - PPO

## 2021-11-01 ENCOUNTER — Other Ambulatory Visit: Payer: Self-pay

## 2021-11-01 ENCOUNTER — Encounter: Payer: Self-pay | Admitting: Family

## 2021-11-01 DIAGNOSIS — E559 Vitamin D deficiency, unspecified: Secondary | ICD-10-CM

## 2021-11-22 NOTE — Progress Notes (Unsigned)
NEUROLOGY CONSULTATION NOTE  Joanna Lambert MRN: [id number] DOB: 1955/11/21  Referring provider: Debbrah Alar, NP Primary care provider: Debbrah Alar, NP  Reason for consult:  migraine  Assessment/Plan:   Migraine without aura, without status migrainosus, not intractable  Migraine prevention:  Not indicated Migraine rescue:  She will continue rizatriptan as first line for now.  She will take Ubrelvy as second line (alone or with second dose of rizatriptan) Limit use of pain relievers to no more than 2 days out of week to prevent risk of rebound or medication-overuse headache. Keep headache diary Follow up one year or as needed.    Subjective:  Joanna Lambert is a 66 year old female who presents for migraines.  History supplemented by referring provider's note.  Onset:  66 years old during pregnancy with her son.  They were cyclic around her menstrual cycle.  Following menopause, they became sporadic.  Other triggers include sunlight, foods with nitrates (bacon), alcohol.  Estradiol helped for several years but was stopped due to increased risk for migraines.  Now they occur on average 3 times a month.  They are now moderate, left frontal (sometimes bilateral) pressure associated with nausea, photophobia, phonophobia but no vomiting.  Has had a visual aura (geometric pattern or dots) about 6 times in her life.  They typically last 6 hours with rizatriptan but may have a postdrome headache and may return next day (occurs 1/3 of time)   Past NSAIDS/analgesics:  ibuprofen, naproxen, acetaminophen, Excedrin Past abortive triptans:  sumatriptan [place] Past abortive ergotamine:  none Past muscle relaxants:  none Past anti-emetic:  promethazine Past antihypertensive medications:  none Past antidepressant medications:  amitriptyline Past anticonvulsant medications:  topiramate Past anti-CGRP:  none   Current NSAIDS/analgesics:  ASA '81mg'$  Current triptans:   rizatriptan'10mg'$  Current ergotamine:  none Current anti-emetic:  none Current muscle relaxants:  none Current Antihypertensive medications:  none Current Antidepressant medications:  none Current Anticonvulsant medications:  none Current anti-CGRP:  none Current Vitamins/Herbal/Supplements:  D, Ca, biotin Current Antihistamines/Decongestants:  none Other therapy:  estradiol    Caffeine:  1 cup of coffee daily Diet:  Drinks a lot of water.  No soda.  Exercise:  routine Depression:  no; Anxiety:  no Other pain:  no Sleep hygiene:  Sleeps 6 hours a night.   Family history of headache:  no History of concussions as a child (2 were formally diagnosed as concussions)      PAST MEDICAL HISTORY: Past Medical History:  Diagnosis Date   Allergy    seasonal   Heart murmur    Migraines    Ovarian cyst    right   Restless legs    Varicose veins    Vitamin D deficiency 12/26/2014    PAST SURGICAL HISTORY: Past Surgical History:  Procedure Laterality Date   APPENDECTOMY     BREAST BIOPSY     BREAST SURGERY Right 2012   biopsy, benign cyst   CARPAL TUNNEL RELEASE Bilateral 2010   COLONOSCOPY  06/13/2016   Dr.Nandigam   FOOT SURGERY Right 2011   bunion removal   POLYPECTOMY      MEDICATIONS: Current Outpatient Medications on File Prior to Visit  Medication Sig Dispense Refill   aspirin 81 MG tablet Take 81 mg by mouth daily.     Biotin 5000 MCG CAPS Take by mouth.     calcium carbonate (OS-CAL - DOSED IN MG OF ELEMENTAL CALCIUM) 1250 (500 Ca) MG tablet Take by mouth.  clotrimazole-betamethasone (LOTRISONE) cream APPLY EXTERNALLY TO THE AFFECTED AREA TWICE DAILY 45 g 1   EPINEPHrine (EPIPEN 2-PAK) 0.3 mg/0.3 mL IJ SOAJ injection Inject 0.3 mg into the muscle as needed for anaphylaxis. (Patient not taking: Reported on 09/04/2021) 1 each 1   Flaxseed, Linseed, (FLAX SEED OIL PO) Take by mouth.     metroNIDAZOLE (METROCREAM) 0.75 % cream Apply 1 application topically  daily.  11   Minoxidil 5 % FOAM Apply topically.     rizatriptan (MAXALT) 10 MG tablet TAKE BY MOUTH AS DIRECTED MAY REPEAT IN 2 HOURS IF NEEDED 10 tablet 3   typhoid (VIVOTIF) DR capsule One capsule on alternate days (day 1, 3, 5, and 7) for a total of 4 doses; all doses should be complete at least 1 week prior to potential exposure (Patient not taking: Reported on 09/04/2021) 4 capsule 0   Vitamin D, Ergocalciferol, (DRISDOL) 1.25 MG (50000 UNIT) CAPS capsule Take 1 capsule (50,000 Units total) by mouth every 7 (seven) days. 12 capsule 0   No current facility-administered medications on file prior to visit.    ALLERGIES: Allergies  Allergen Reactions   Bee Venom     Local swelling   Adhesive [Tape] Rash    FAMILY HISTORY: Family History  Problem Relation Age of Onset   Diabetes Mother    Hypertension Mother 14   Hyperlipidemia Mother    Heart disease Mother        Aortic stenosis   Heart disease Father 32       pacemaker   Atrial fibrillation Father 50   Peripheral vascular disease Father        s/p Carotid Edartarectomy   Dementia Father    Colon cancer Neg Hx    Colon polyps Neg Hx    Esophageal cancer Neg Hx    Stomach cancer Neg Hx    Rectal cancer Neg Hx     Objective:  Blood pressure 136/79, pulse 81, height '5\' 4"'$  (1.626 m), weight 136 lb 12.8 oz (62.1 kg), last menstrual period 05/12/2013, SpO2 99 %. General: No acute distress.  Patient appears well-groomed.   Head:  Normocephalic/atraumatic Eyes:  fundi examined but not visualized Neck: supple, no paraspinal tenderness, full range of motion Back: No paraspinal tenderness Heart: regular rate and rhythm Neurological Exam: Mental status: alert and oriented to person, place, and time, speech fluent and not dysarthric, language intact. Cranial nerves: CN I: not tested CN II: pupils equal, round and reactive to light, visual fields intact CN III, IV, VI:  full range of motion, no nystagmus, no ptosis CN V:  facial sensation intact. CN VII: upper and lower face symmetric CN VIII: hearing intact CN IX, X: gag intact, uvula midline CN XI: sternocleidomastoid and trapezius muscles intact CN XII: tongue midline Bulk & Tone: normal, no fasciculations. Motor:  muscle strength 5/5 throughout Sensation:  Temperature and vibratory sensation intact. Deep Tendon Reflexes:  2+ throughout,  toes downgoing.   Finger to nose testing:  Without dysmetria.   Heel to shin:  Without dysmetria.   Gait:  Normal station and stride.  Romberg negative.    Thank you for allowing me to take part in the care of this patient.  Metta Clines, DO  CC: Debbrah Alar, NP

## 2021-11-25 ENCOUNTER — Ambulatory Visit (INDEPENDENT_AMBULATORY_CARE_PROVIDER_SITE_OTHER): Payer: Federal, State, Local not specified - PPO | Admitting: Neurology

## 2021-11-25 ENCOUNTER — Encounter: Payer: Self-pay | Admitting: Neurology

## 2021-11-25 ENCOUNTER — Ambulatory Visit: Payer: Federal, State, Local not specified - PPO | Admitting: Family

## 2021-11-25 ENCOUNTER — Telehealth: Payer: Self-pay | Admitting: *Deleted

## 2021-11-25 VITALS — BP 136/79 | HR 81 | Ht 64.0 in | Wt 136.8 lb

## 2021-11-25 DIAGNOSIS — G43009 Migraine without aura, not intractable, without status migrainosus: Secondary | ICD-10-CM

## 2021-11-25 MED ORDER — UBRELVY 100 MG PO TABS: 1.0000 | ORAL_TABLET | ORAL | 5 refills | Status: DC | PRN

## 2021-11-25 NOTE — Telephone Encounter (Signed)
Who Is Calling Patient / Member / Family / Caregiver Caller Name [name] Caller Phone Number [phone] Patient Name Joanna Lambert Patient DOB 01/14/1956 Call Type Message Only Information Provided Reason for Call Request to [facility] Appointment Initial Comment Caller states she had an appointment tomorrow. She needs to cancel. Patient request to speak to RN No Disp. Time Disposition Final User 11/24/2021 7:30:02 AM General Information Provided Yes Olga Coaster

## 2021-11-25 NOTE — Patient Instructions (Signed)
  Take rizatriptan '10mg'$  at earliest onset of headache.  If migraine not resolved in 2 hours, may take [place] with another rizatriptan (maximum 2 tablets of rizatriptan and Ubrelvy in 24 hours) Limit use of pain relievers to no more than 2 days out of the week.  These medications include acetaminophen, NSAIDs (ibuprofen/Advil/Motrin, naproxen/Aleve, triptans (Imitrex/sumatriptan), Excedrin, and narcotics.  This will help reduce risk of rebound headaches. Be aware of common food triggers:  - Caffeine:  coffee, black tea, cola, Mt. Dew  - Chocolate  - Dairy:  aged cheeses (brie, blue, cheddar, gouda, [place], provolone, [place], Swiss, etc), chocolate milk, buttermilk, sour cream, limit eggs and yogurt  - Nuts, peanut butter  - Alcohol  - Cereals/grains:  FRESH breads (fresh bagels, sourdough, doughnuts), yeast productions  - Processed/canned/aged/cured meats (pre-packaged deli meats, hotdogs)  - MSG/glutamate:  soy sauce, flavor enhancer, pickled/preserved/marinated foods  - Sweeteners:  aspartame (Equal, Nutrasweet).  Sugar and Splenda are okay  - Vegetables:  legumes (lima beans, lentils, snow peas, fava beans, pinto peans, peas, garbanzo beans), sauerkraut, onions, olives, pickles  - Fruit:  avocados, bananas, citrus fruit (orange, lemon, grapefruit), mango  - Other:  Frozen meals, macaroni and cheese Routine exercise Stay adequately hydrated (aim for 64 oz water daily) Keep headache diary Maintain proper stress management Maintain proper sleep hygiene Do not skip meals Consider supplements:  magnesium citrate '400mg'$  daily, riboflavin '400mg'$  daily, coenzyme Q10 '100mg'$  three times daily.

## 2021-11-25 NOTE — Telephone Encounter (Signed)
Spoke with pt advised appt was today and she still wanted to cancel because she feels better.  Appt cancelled.

## 2021-12-03 ENCOUNTER — Other Ambulatory Visit (HOSPITAL_COMMUNITY): Payer: Self-pay

## 2021-12-04 ENCOUNTER — Telehealth (HOSPITAL_COMMUNITY): Payer: Self-pay | Admitting: Pharmacy Technician

## 2021-12-04 NOTE — Telephone Encounter (Signed)
Per patient she was able to pick up her Urblevy. Patient unsure if it was due to her having [employer].  But patient advised if they give her a hard time pick up next month she will give [place] a call.    Please send prescription for Nurtec daily as needed.  Maximum 1 tablet in 24 hours.  Quantity 16. Refills 5.  Please let patient know this is a similar medication to [place] that is covered by her insurance.

## 2021-12-04 NOTE — Telephone Encounter (Signed)
Patient Advocate Encounter  Received notification that the request for prior authorization for Ubrelvy 100 mg has been denied due to not covered.   Formulary alternative is Nurtec.      Lyndel Safe, [place] Patient Advocate Specialist [place] Patient Advocate Team Direct Number: [phone]  Fax: [phone]

## 2021-12-04 NOTE — Telephone Encounter (Signed)
Patient Advocate Encounter   Received notification that prior authorization for Ubrelvy 100 mg is required.   PA submitted on 12/03/2021 [employer] Employees BCBS Status is pending       Lyndel Safe, [place] Patient Advocate Specialist [place] Patient Advocate Team Direct Number: [phone]  Fax: [phone]

## 2021-12-04 NOTE — Telephone Encounter (Signed)
Tried calling patient to inform of Denial of Urblevy and what Dr.Jaffe suggest her to do. No answer.   LMOVM to call the office back.

## 2021-12-04 NOTE — Telephone Encounter (Signed)
Patient called back

## 2021-12-16 ENCOUNTER — Telehealth: Payer: Federal, State, Local not specified - PPO | Admitting: Physician Assistant

## 2021-12-16 DIAGNOSIS — R3989 Other symptoms and signs involving the genitourinary system: Secondary | ICD-10-CM

## 2021-12-16 MED ORDER — CEPHALEXIN 500 MG PO CAPS: 500.0000 mg | ORAL_CAPSULE | Freq: Two times a day (BID) | ORAL | 0 refills | Status: DC

## 2021-12-16 NOTE — Progress Notes (Signed)

## 2022-01-30 ENCOUNTER — Ambulatory Visit (INDEPENDENT_AMBULATORY_CARE_PROVIDER_SITE_OTHER): Payer: Federal, State, Local not specified - PPO

## 2022-01-30 DIAGNOSIS — Z23 Encounter for immunization: Secondary | ICD-10-CM

## 2022-02-14 DIAGNOSIS — H18513 Endothelial corneal dystrophy, bilateral: Secondary | ICD-10-CM | POA: Diagnosis not present

## 2022-02-14 DIAGNOSIS — H2513 Age-related nuclear cataract, bilateral: Secondary | ICD-10-CM | POA: Diagnosis not present

## 2022-02-14 DIAGNOSIS — H5203 Hypermetropia, bilateral: Secondary | ICD-10-CM | POA: Diagnosis not present

## 2022-02-14 DIAGNOSIS — H04123 Dry eye syndrome of bilateral lacrimal glands: Secondary | ICD-10-CM | POA: Diagnosis not present

## 2022-02-14 DIAGNOSIS — H0015 Chalazion left lower eyelid: Secondary | ICD-10-CM | POA: Diagnosis not present

## 2022-02-14 DIAGNOSIS — H524 Presbyopia: Secondary | ICD-10-CM | POA: Diagnosis not present

## 2022-02-21 ENCOUNTER — Ambulatory Visit: Payer: Federal, State, Local not specified - PPO

## 2022-03-19 DIAGNOSIS — L820 Inflamed seborrheic keratosis: Secondary | ICD-10-CM | POA: Diagnosis not present

## 2022-03-19 DIAGNOSIS — L821 Other seborrheic keratosis: Secondary | ICD-10-CM | POA: Diagnosis not present

## 2022-03-19 DIAGNOSIS — Z789 Other specified health status: Secondary | ICD-10-CM | POA: Diagnosis not present

## 2022-03-19 DIAGNOSIS — L298 Other pruritus: Secondary | ICD-10-CM | POA: Diagnosis not present

## 2022-03-19 DIAGNOSIS — D1801 Hemangioma of skin and subcutaneous tissue: Secondary | ICD-10-CM | POA: Diagnosis not present

## 2022-03-19 DIAGNOSIS — L538 Other specified erythematous conditions: Secondary | ICD-10-CM | POA: Diagnosis not present

## 2022-03-19 DIAGNOSIS — L814 Other melanin hyperpigmentation: Secondary | ICD-10-CM | POA: Diagnosis not present

## 2022-04-14 ENCOUNTER — Telehealth: Payer: Self-pay | Admitting: Neurology

## 2022-04-14 ENCOUNTER — Other Ambulatory Visit: Payer: Self-pay | Admitting: Neurology

## 2022-04-14 MED ORDER — NURTEC 75 MG PO TBDP: 75.0000 mg | ORAL_TABLET | Freq: Every day | ORAL | 11 refills | Status: DC | PRN

## 2022-04-14 NOTE — Telephone Encounter (Signed)
Patient advised of dr.Jaffe note, I sent a prescription for Nurtec to the [facility].  Will also likely need a prior auth  PA team please start a PA for Nurtec.

## 2022-04-14 NOTE — Telephone Encounter (Signed)
Pt states that her insurance will not pay for the Ubrelvy  so Dr Tomi Likens told her that tere is a medication that works like ubrevly that we could call in  to the pharmacy  she does not know the name of the medication  \ [facility] on [address]

## 2022-04-15 ENCOUNTER — Other Ambulatory Visit (HOSPITAL_COMMUNITY): Payer: Self-pay

## 2022-04-15 ENCOUNTER — Telehealth: Payer: Self-pay

## 2022-04-15 NOTE — Telephone Encounter (Signed)
Patient Advocate Encounter   Received notification from [place] that prior authorization is required for Nurtec '75MG'$  dispersible tablets  Submitted: 04-15-2022 Key [id number]   Status is pending

## 2022-04-16 NOTE — Telephone Encounter (Signed)
LMOVM for patient of approval.

## 2022-04-16 NOTE — Telephone Encounter (Signed)
Patient Advocate Encounter  Prior Authorization for Nurtec '75MG'$  dispersible tablets has been approved.    Effective: 04-15-2022 to 10-12-2022

## 2022-04-24 ENCOUNTER — Telehealth: Payer: Self-pay | Admitting: Nurse Practitioner

## 2022-04-24 DIAGNOSIS — G43009 Migraine without aura, not intractable, without status migrainosus: Secondary | ICD-10-CM

## 2022-04-24 NOTE — Telephone Encounter (Signed)
Denied. Neurology has changed treatment to Nurtec.

## 2022-04-24 NOTE — Telephone Encounter (Signed)
MEDICATION:rizatriptan (MAXALT) 10 MG tablet   Is the patient out of medication? YES  PHARMACY: [id number] - [place], [place] - [address] AT [address] (Ph: [phone])  COMMENTS: last refilled by OBGYN, was given the okay by Dr.Jaffe to take Maxalt when Nurtec wasn't helping

## 2022-04-24 NOTE — Telephone Encounter (Signed)
Last AEX 05/22/2021--nothing scheduled yet for 2024

## 2022-04-25 ENCOUNTER — Other Ambulatory Visit: Payer: Self-pay | Admitting: Neurology

## 2022-04-25 MED ORDER — RIZATRIPTAN BENZOATE 10 MG PO TABS
ORAL_TABLET | ORAL | 5 refills | Status: DC
Start: 2022-04-25 — End: 2022-11-26

## 2022-04-25 NOTE — Telephone Encounter (Signed)
LMOVm for patient, I have sent a refill for rizatriptan to her pharmacy

## 2022-05-24 DIAGNOSIS — U071 COVID-19: Secondary | ICD-10-CM | POA: Diagnosis not present

## 2022-05-24 DIAGNOSIS — Z6823 Body mass index (BMI) 23.0-23.9, adult: Secondary | ICD-10-CM | POA: Diagnosis not present

## 2022-05-26 ENCOUNTER — Telehealth: Payer: Self-pay | Admitting: *Deleted

## 2022-05-26 NOTE — Telephone Encounter (Signed)
Spoke with patient and she had already [facility].

## 2022-05-26 NOTE — Telephone Encounter (Signed)
Who Is Calling Patient / Member / Family / Caregiver Call Type Triage / Clinical Relationship To Patient Self Return Phone Number [phone] (Primary) Chief Complaint Headache Reason for Call Symptomatic / Request for Health Information Initial Comment Caller states she tested pos for Covid-19 this morning. c/o Sore throat, and Headache, Translation No Nurse Assessment Nurse: Sharen Counter, RN, Ashby Dawes Date/Time (Eastern Time): 05/24/2022 9:55:14 AM Confirm and document reason for call. If symptomatic, describe symptoms. ---Caller states she tested positive for Covid-19 this morning. c/o Sore throat, and Headache. Pt states she had a sore throat for a couple days( Thursday) and this morning headache started. Denies fevers, dyspnea, DOE, chest pain/pressure. Pt wants to know if she can take Paxlovid. Pharmacy is [facility] on [address], phone number is [phone].  Final Disposition 05/24/2022 10:02:29 AM See HCP within 4 Hours (or PCP triage) Yes Sharen Counter, RN, Ashby Dawes Disposition Overriden: Call PCP within 24 Hours Override Reason: Specify reason. (Please document in 'advice recommended' section) Caller Disagree/Comply Disagree Caller Understands Yes PreDisposition Call Doctor

## 2022-06-05 ENCOUNTER — Other Ambulatory Visit (HOSPITAL_BASED_OUTPATIENT_CLINIC_OR_DEPARTMENT_OTHER): Payer: Self-pay | Admitting: Family

## 2022-06-05 DIAGNOSIS — Z1231 Encounter for screening mammogram for malignant neoplasm of breast: Secondary | ICD-10-CM

## 2022-06-17 ENCOUNTER — Encounter (HOSPITAL_BASED_OUTPATIENT_CLINIC_OR_DEPARTMENT_OTHER): Payer: Self-pay

## 2022-06-17 ENCOUNTER — Ambulatory Visit (HOSPITAL_BASED_OUTPATIENT_CLINIC_OR_DEPARTMENT_OTHER)
Admission: RE | Admit: 2022-06-17 | Discharge: 2022-06-17 | Disposition: A | Discharge status: Home / Self Care | Payer: Federal, State, Local not specified - PPO | Source: Ambulatory Visit | Attending: Family | Admitting: Family

## 2022-06-17 DIAGNOSIS — Z1231 Encounter for screening mammogram for malignant neoplasm of breast: Secondary | ICD-10-CM

## 2022-06-18 ENCOUNTER — Ambulatory Visit (INDEPENDENT_AMBULATORY_CARE_PROVIDER_SITE_OTHER): Payer: Federal, State, Local not specified - PPO | Admitting: Nurse Practitioner

## 2022-06-18 ENCOUNTER — Other Ambulatory Visit (HOSPITAL_COMMUNITY)
Admission: RE | Admit: 2022-06-18 | Discharge: 2022-06-18 | Disposition: A | Discharge status: Home / Self Care | Payer: Federal, State, Local not specified - PPO | Source: Ambulatory Visit | Attending: Nurse Practitioner | Admitting: Nurse Practitioner

## 2022-06-18 ENCOUNTER — Encounter: Payer: Self-pay | Admitting: Nurse Practitioner

## 2022-06-18 VITALS — BP 110/68 | HR 97 | Ht 63.5 in | Wt 130.0 lb

## 2022-06-18 DIAGNOSIS — R1030 Lower abdominal pain, unspecified: Secondary | ICD-10-CM

## 2022-06-18 DIAGNOSIS — N951 Menopausal and female climacteric states: Secondary | ICD-10-CM | POA: Diagnosis not present

## 2022-06-18 DIAGNOSIS — Z124 Encounter for screening for malignant neoplasm of cervix: Secondary | ICD-10-CM

## 2022-06-18 DIAGNOSIS — Z01419 Encounter for gynecological examination (general) (routine) without abnormal findings: Secondary | ICD-10-CM | POA: Diagnosis not present

## 2022-06-18 DIAGNOSIS — M8589 Other specified disorders of bone density and structure, multiple sites: Secondary | ICD-10-CM | POA: Diagnosis not present

## 2022-06-18 DIAGNOSIS — N950 Postmenopausal bleeding: Secondary | ICD-10-CM | POA: Diagnosis not present

## 2022-06-18 NOTE — Progress Notes (Signed)
Joanna Lambert 03/08/1956 [id number]   History:  67 y.o. G2P0002 presents for annual exam. Postmenopausal. Reports 2 episodes of vaginal spotting a few months ago. Thought maybe it was hemorrhoids and took a Q-tip to check and is very sure it was from the vagina. Bleeding was very light, mostly with wiping both times and lasted a couple of days. One episode occurred after intercourse but the other was random. Also has had intermittent RLQ pain that she feels is similar to when she had cysts in the past. Also has felt like her uterus has spasms sometimes. Stopped vaginal estrogen as she felt like it was causing headaches and insomnia. Using Replens. Normal pap and mammogram history. Osteopenia.   Gynecologic History Patient's last menstrual period was 05/12/2013.   Contraception: post menopausal status Sexually active: Yes  Health maintenance Last Pap: 03/23/2018. Results were: Normal neg HPV, 5-year repeat Last mammogram: 06/17/2022. Results were: No report yet Last colonoscopy: 09/18/2021. Results were: Adenoma, 5-year recall Last Dexa: 06/11/2021. Results were: T-score -2.0, FRAX 7.8% / 0.5%  Past medical history, past surgical history, family history and social history were all reviewed and documented in the EPIC chart. Married. Daughter lives here with 2 sons ages 2 and 2, son up [place]. Moved here from [place] to be close to grandchildren. Mother and father with history of heart disease.   ROS:  A ROS was performed and pertinent positives and negatives are included.  Exam:  Vitals:   06/18/22 1021  BP: 110/68  Pulse: 97  SpO2: 98%  Weight: 130 lb (59 kg)  Height: 5' 3.5" (1.613 m)     Body mass index is 22.67 kg/m.  General appearance:  Normal Thyroid:  Symmetrical, normal in size, without palpable masses or nodularity. Respiratory  Auscultation:  Clear without wheezing or rhonchi Cardiovascular  Auscultation:  Regular rate, without rubs, murmurs or  gallops  Edema/varicosities:  Not grossly evident Abdominal  Soft,nontender, without masses, guarding or rebound.  Liver/spleen:  No organomegaly noted  Hernia:  None appreciated  Skin  Inspection:  Grossly normal   Breasts: Examined lying and sitting.   Right: Without masses, retractions, discharge or axillary adenopathy.   Left: Without masses, retractions, discharge or axillary adenopathy. Genitourinary   Inguinal/mons:  Normal without inguinal adenopathy  External genitalia:  Normal appearing vulva with no masses, tenderness, or lesions  BUS/Urethra/Skene's glands:  Normal  Vagina:  Normal appearing with normal color and discharge, no lesions. Atrophic changes  Cervix:  Normal appearing without discharge or lesions  Uterus:  Normal in size, shape and contour.  Midline and mobile, nontender  Adnexa/parametria:     Rt: Normal in size, without masses or tenderness.   Lt: Normal in size, without masses or tenderness.  Anus and perineum: Normal  Patient informed chaperone available to be present for breast and pelvic exam. Patient has requested no chaperone to be present. Patient has been advised what will be completed during breast and pelvic exam.   Assessment/Plan:  67 y.o. G2P0002 for annual exam.   Well female exam with routine gynecological exam - Education provided on SBEs, importance of preventative screenings, current guidelines, high calcium diet, regular exercise, and multivitamin daily.  Labs with PCP.   Menopausal vaginal dryness - stopped vaginal estrogen as she felt it was causing headaches and insomnia. Using Replens. Also recommend coconut oil.   PMB (postmenopausal bleeding) - Plan: US PELVIS TRANSVAGINAL NON-OB (TV ONLY)  Lower abdominal pain - Plan: US PELVIS TRANSVAGINAL NON-OB (TV ONLY)  Osteopenia of multiple sites - T-score -2.0 January 2023. Taking daily vitamin D + calcium supplement and exercising regularly.    Screening for cervical cancer - Plan:  Cytology - PAP( [facility]). Normal pap history. Pap today.   Screening for breast cancer - Normal mammogram history. Continue annual screenings. Normal breast exam today.  Screening for colon cancer - Screening colonoscopy 09/2021. Will repeat at 5-year interval per GI's recommendations.   Return for ultrasound. 1 year for annual exam.    Tamela Gammon [facility], 10:53 AM 06/18/2022

## 2022-06-24 LAB — CYTOLOGY - PAP
Comment: NEGATIVE
Diagnosis: NEGATIVE
Diagnosis: REACTIVE
High risk HPV: NEGATIVE

## 2022-07-17 ENCOUNTER — Encounter: Payer: Self-pay | Admitting: Nurse Practitioner

## 2022-07-17 ENCOUNTER — Ambulatory Visit (INDEPENDENT_AMBULATORY_CARE_PROVIDER_SITE_OTHER): Payer: Federal, State, Local not specified - PPO | Admitting: Nurse Practitioner

## 2022-07-17 ENCOUNTER — Ambulatory Visit (INDEPENDENT_AMBULATORY_CARE_PROVIDER_SITE_OTHER): Payer: Federal, State, Local not specified - PPO

## 2022-07-17 VITALS — BP 128/78 | HR 75

## 2022-07-17 DIAGNOSIS — R1030 Lower abdominal pain, unspecified: Secondary | ICD-10-CM

## 2022-07-17 DIAGNOSIS — N952 Postmenopausal atrophic vaginitis: Secondary | ICD-10-CM

## 2022-07-17 DIAGNOSIS — N950 Postmenopausal bleeding: Secondary | ICD-10-CM | POA: Diagnosis not present

## 2022-07-17 NOTE — Progress Notes (Signed)
   Acute Office Visit  Subjective:    Patient ID: Joanna Lambert, female    DOB: [birth year], 67 y.o.   MRN: [id number]   HPI 67 y.o. presents today for ultrasound. Seen 06/18/2022 for annual exam and reported two episodes of vaginal spotting a few months ago. Thought maybe it was hemorrhoids and took a Q-tip to check and is very sure it was from the vagina. Bleeding was very light, mostly with wiping both times and lasted a couple of days. One episode occurred after intercourse but the other was random. Also has had intermittent RLQ pain that she feels is similar to when she had cysts in the past. Painful intercourse. Stopped vaginal estrogen as she thought it may have been causing an increase in headaches.    Review of Systems  Constitutional: Negative.        Objective:    Physical Exam Constitutional:      Appearance: Normal appearance.     BP 128/78 (BP Location: Left Arm, Patient Position: Sitting, Cuff Size: Normal)   Pulse 75   LMP 05/12/2013   SpO2 97%  Wt Readings from Last 3 Encounters:  06/18/22 130 lb (59 kg)  11/25/21 136 lb 12.8 oz (62.1 kg)  09/18/21 132 lb (59.9 kg)         Assessment & Plan:   Problem List Items Addressed This Visit   None Visit Diagnoses     Lower abdominal pain    -  Primary   PMB (postmenopausal bleeding)       Atrophic vaginitis          Vaginal ultrasound: Retroverted uterus, normal size and shape, no myometrial masses.  Thin, symmetrical endometrium - 1.53 mm.  No masses or thickening seen.  Right ovary 16 x 9 x 11 mm avascular solid area still present (was 13 x 8 x 8 mm in 2017).  Left ovary within normal limits, atrophic.  No adnexal masses, no free fluid.  Plan: Ultrasound thoroughly reviewed with patient. Thin endometrium, small simple ovarian cyst in right ovary. Very minimal increase in size since last seen in 2017. Normal CA-125 in 2017. No need for follow up imaging unless she becomes symptomatic. If spotting recurs  recommend retrying vaginal estrogen and she is agreeable.      [place], 4:12 PM 07/17/2022

## 2022-08-20 ENCOUNTER — Encounter: Payer: Self-pay | Admitting: Family

## 2022-08-20 ENCOUNTER — Ambulatory Visit (INDEPENDENT_AMBULATORY_CARE_PROVIDER_SITE_OTHER): Payer: Federal, State, Local not specified - PPO | Admitting: Family

## 2022-08-20 VITALS — BP 136/76 | HR 80 | Temp 98.2°F | Resp 16 | Ht 63.5 in | Wt 133.6 lb

## 2022-08-20 DIAGNOSIS — E785 Hyperlipidemia, unspecified: Secondary | ICD-10-CM | POA: Insufficient documentation

## 2022-08-20 DIAGNOSIS — N83209 Unspecified ovarian cyst, unspecified side: Secondary | ICD-10-CM | POA: Diagnosis not present

## 2022-08-20 DIAGNOSIS — Z0001 Encounter for general adult medical examination with abnormal findings: Secondary | ICD-10-CM

## 2022-08-20 DIAGNOSIS — Z0000 Encounter for general adult medical examination without abnormal findings: Secondary | ICD-10-CM

## 2022-08-20 DIAGNOSIS — E559 Vitamin D deficiency, unspecified: Secondary | ICD-10-CM | POA: Diagnosis not present

## 2022-08-20 DIAGNOSIS — R6889 Other general symptoms and signs: Secondary | ICD-10-CM

## 2022-08-20 LAB — COMPREHENSIVE METABOLIC PANEL
ALT: 18 U/L (ref 0–35)
AST: 20 U/L (ref 0–37)
Albumin: 4.6 g/dL (ref 3.5–5.2)
Alkaline Phosphatase: 86 U/L (ref 39–117)
BUN: 14 mg/dL (ref 6–23)
CO2: 31 mEq/L (ref 19–32)
Calcium: 9.9 mg/dL (ref 8.4–10.5)
Chloride: 100 mEq/L (ref 96–112)
Creatinine, Ser: 0.85 mg/dL (ref 0.40–1.20)
GFR: 72.15 mL/min (ref >60.00)
Glucose, Bld: 85 mg/dL (ref 70–99)
Potassium: 4.7 mEq/L (ref 3.5–5.1)
Sodium: 139 mEq/L (ref 135–145)
Total Bilirubin: 0.8 mg/dL (ref 0.2–1.2)
Total Protein: 7.3 g/dL (ref 6.0–8.3)

## 2022-08-20 LAB — LIPID PANEL
Cholesterol: 245 mg/dL — ABNORMAL HIGH (ref 0–200)
HDL: 55.5 mg/dL (ref >39.00)
LDL Cholesterol: 171 mg/dL — ABNORMAL HIGH (ref 0–99)
NonHDL: 189.23
Total CHOL/HDL Ratio: 4
Triglycerides: 93 mg/dL (ref 0.0–149.0)
VLDL: 18.6 mg/dL (ref 0.0–40.0)

## 2022-08-20 LAB — T4, FREE: Free T4: 0.84 ng/dL (ref 0.60–1.60)

## 2022-08-20 LAB — VITAMIN D 25 HYDROXY (VIT D DEFICIENCY, FRACTURES): VITD: 25.34 ng/mL — ABNORMAL LOW (ref 30.00–100.00)

## 2022-08-20 LAB — T3, FREE: T3, Free: 3.8 pg/mL (ref 2.3–4.2)

## 2022-08-20 LAB — TSH: TSH: 3.04 u[IU]/mL (ref 0.35–5.50)

## 2022-08-20 NOTE — Assessment & Plan Note (Signed)
Taking vit D supplement. Recheck Vitamin D level.

## 2022-08-20 NOTE — Progress Notes (Signed)
Subjective:   By signing my name below, I, Barrett Shell, attest that this documentation has been prepared under the direction and in the presence of Sandford Craze, NP. 08/20/2022   Patient ID: Joanna Lambert, female    DOB: [birth year], 67 y.o.   MRN: [id number]  Chief Complaint  Patient presents with   Annual Exam         HPI Patient is in today for a comprehensive physical exam.   Heat intolerance: She complains of heat intolerance- especially at night.  She notes mild intermittent hand tremors as well.  She reports that she is usually energetic, but recently she finds that she will suddenly run out of energy and has to lay down. She has been having bowel movements more frequently than usual. This is unusual for her as she usually suffers from constipation.   Right ovary: 16 x 9 x 11 mm avascular solid area still present on the right ovary (was 13 x 8 x 8 mm in 2017).   Acute: She denies fever, unexpected weight change, adenopathy, new moles, sinus pain, sore throat, visual disturbance, chest pain, palpitations, leg swelling, cough, shortness of breath, wheezing, nausea, vomiting, diarrhea, constipation, blood in stool, dysuria, frequency, hematuria, new muscle pain, new joint pain, depression or anxiety at this time.   Migraines: She experiences migraines and is currently seeing a neurologist. She has taken Ubrelvy in the past and found relief while taking it.   Biotin: She takes 500 mcg Biotin daily PO.   Colonoscopy: Last colonoscopy completed on 09/18/2021. One 5 mm polyp found in the transverse colon. Non-bleeding external and internal hemorrhoids found, otherwise results are normal. Repeat in 5-10 years.   Dexa: Last Dexa completed on 06/11/2021. The BMD measured at Forearm Radius 33% is 0.705 g/cm2 with a T-score of -2.0. This patient is considered osteopenic according to World Health Organization [facility]) criteria. Compared with the prior study on, 05/25/2018 the BMD of the  total mean shows a statistically significant decrease. Repeat in 2 years.   Pap Smear: Last pap smear completed on 06/18/2022. Results are normal. Repeat in 3 years.  Mammogram: Last mammogram completed on 06/17/2022. Results are normal. Repeat in 1 year.   Social history: Her father was recently diagnosed with dementia at 65 years old. No changes otherwise.   Immunizations: She is not yet UTD on the latest Covid-19 immunization.   Vision: She is UTD on vision.   Past Medical History:  Diagnosis Date   Allergy    seasonal   COVID    05/2022   Heart murmur    Migraines    Ovarian cyst    right   Restless legs    Varicose veins    Vitamin D deficiency 12/26/2014    Past Surgical History:  Procedure Laterality Date   APPENDECTOMY     BREAST BIOPSY     BREAST SURGERY Right 2012   biopsy, benign cyst   CARPAL TUNNEL RELEASE Bilateral 2010   COLONOSCOPY  06/13/2016   Dr.Nandigam   FOOT SURGERY Right 2011   bunion removal   POLYPECTOMY      Family History  Problem Relation Age of Onset   Diabetes Mother    Hypertension Mother 15   Hyperlipidemia Mother    Heart disease Mother        Aortic stenosis   Heart disease Father 62       pacemaker   Atrial fibrillation Father 72   Peripheral vascular disease Father  s/p Carotid Edartarectomy   Dementia Father    Migraines Daughter     Social History   Socioeconomic History   Marital status: Married    Spouse name: Not on file   Number of children: Not on file   Years of education: Not on file   Highest education level: Not on file  Occupational History   Not on file  Tobacco Use   Smoking status: Never   Smokeless tobacco: Never  Vaping Use   Vaping Use: Never used  Substance and Sexual Activity   Alcohol use: Yes    Comment: once a month per pt-wine   Drug use: No   Sexual activity: Yes    Partners: Male    Birth control/protection: Post-menopausal    Comment: 1st intercourse 67 yo-Fewer than 5  partners  Other Topics Concern   Not on file  Social History Narrative   Married   2 children (daughter lives here- she has two sons)   Son lives in [place]   Retired from [employer] (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     Left handed   Social Determinants of Health   Financial Resource Strain: Not on file  Food Insecurity: Not on file  Transportation Needs: Not on file  Physical Activity: Not on file  Stress: Not on file  Social Connections: Not on file  Intimate Partner Violence: Not on file    Outpatient Medications Prior to Visit  Medication Sig Dispense Refill   aspirin 81 MG tablet Take 81 mg by mouth daily.     Biotin 5000 MCG CAPS Take by mouth.     calcium carbonate (OS-CAL - DOSED IN MG OF ELEMENTAL CALCIUM) 1250 (500 Ca) MG tablet Take by mouth.     clotrimazole-betamethasone (LOTRISONE) cream APPLY EXTERNALLY TO THE AFFECTED AREA TWICE DAILY 45 g 1   EPINEPHrine (EPIPEN 2-PAK) 0.3 mg/0.3 mL IJ SOAJ injection Inject 0.3 mg into the muscle as needed for anaphylaxis. 1 each 1   metroNIDAZOLE (METROCREAM) 0.75 % cream Apply 1 application topically daily.  11   Minoxidil 5 % FOAM Apply topically.     Rimegepant Sulfate (NURTEC) 75 MG TBDP Take 75 mg by mouth daily as needed. 8 tablet 11   rizatriptan (MAXALT) 10 MG tablet TAKE BY MOUTH AS DIRECTED MAY REPEAT IN 2 HOURS IF NEEDED.  MAXIMUM 2 TABLETS IN 24 HOURS 10 tablet 5   Flaxseed, Linseed, (FLAX SEED OIL PO) Take by mouth.     No facility-administered medications prior to visit.    Allergies  Allergen Reactions   Bee Venom     Local swelling   Adhesive [Tape] Rash    Review of Systems  Constitutional:  Negative for fever.       (-) unexpected weight changes  HENT:  Negative for sinus pain and sore throat.   Eyes:        (-) visual disturbance  Respiratory:  Negative for cough, shortness of breath and wheezing.   Cardiovascular:  Negative for chest pain, palpitations and leg  swelling.  Gastrointestinal:  Negative for abdominal pain, blood in stool, constipation, diarrhea, nausea and vomiting.  Genitourinary:  Negative for dysuria, frequency and hematuria.  Musculoskeletal:        (-) new joint pain (-) new muscle pain  Skin:        (-) new moles  Neurological:  Positive for headaches.  Psychiatric/Behavioral:  Negative for depression.        (-)  anxiety       Objective:    Physical Exam Constitutional:      General: She is not in acute distress.    Appearance: Normal appearance.  HENT:     Head: Normocephalic and atraumatic.     Right Ear: Tympanic membrane, ear canal and external ear normal.     Left Ear: Tympanic membrane, ear canal and external ear normal.  Eyes:     Extraocular Movements: Extraocular movements intact.     Right eye: No nystagmus.     Left eye: No nystagmus.     Pupils: Pupils are equal, round, and reactive to light.  Cardiovascular:     Rate and Rhythm: Normal rate and regular rhythm.     Heart sounds: Normal heart sounds. No murmur heard.    No gallop.  Pulmonary:     Effort: Pulmonary effort is normal. No respiratory distress.     Breath sounds: Normal breath sounds. No wheezing or rales.  Abdominal:     General: Bowel sounds are normal. There is no distension.     Palpations: Abdomen is soft.     Tenderness: There is no abdominal tenderness. There is no guarding.  Musculoskeletal:     Cervical back: Normal range of motion.     Comments: (+) 5/5 strength in upper and lower extremities  Lymphadenopathy:     Cervical: No cervical adenopathy.  Skin:    General: Skin is warm.  Neurological:     Mental Status: She is alert and oriented to person, place, and time.     Deep Tendon Reflexes:     Reflex Scores:      Patellar reflexes are 2+ on the right side and 2+ on the left side.    Comments: Fine resting hand tremor  Psychiatric:        Behavior: Behavior normal.     BP 136/76 (BP Location: Right Arm, Patient  Position: Sitting, Cuff Size: Small)   Pulse 80   Temp 98.2 F (36.8 C) (Oral)   Resp 16   Ht 5' 3.5" (1.613 m)   Wt 133 lb 9.6 oz (60.6 kg)   LMP 05/12/2013   SpO2 99%   BMI 23.29 kg/m  Wt Readings from Last 3 Encounters:  08/20/22 133 lb 9.6 oz (60.6 kg)  06/18/22 130 lb (59 kg)  11/25/21 136 lb 12.8 oz (62.1 kg)       Assessment & Plan:  Heat intolerance Assessment & Plan: She has multiple symptoms concerning for hyperthyroid- hyper-defecation, hand tremor, heat intolerance. States mother had hyperthyroid.  Will obtain TFT's.  She is on biotin.   Wt Readings from Last 3 Encounters:  08/20/22 133 lb 9.6 oz (60.6 kg)  06/18/22 130 lb (59 kg)  11/25/21 136 lb 12.8 oz (62.1 kg)     Orders: -     TSH -     T3, free -     T4, free  Vitamin D deficiency Assessment & Plan: Taking vit D supplement. Recheck Vitamin D level.   Orders: -     VITAMIN D 25 Hydroxy (Vit-D Deficiency, Fractures)  Hyperlipidemia, unspecified hyperlipidemia type -     Comprehensive metabolic panel -     Lipid panel  Cyst of ovary, unspecified laterality Assessment & Plan: She is being followed by GYN with serial US's.     Encounter for general adult medical examination with abnormal findings Assessment & Plan: Continues healthy diet, exercise.  Immunizations reviewed and up to date.  Pap/mammo and dexa are also up to date.      I, Lemont Fillers, NP, personally preformed the services described in this documentation.  All medical record entries made by the scribe were at my direction and in my presence.  I have reviewed the chart and discharge instructions (if applicable) and agree that the record reflects my personal performance and is accurate and complete. 08/20/2022  Lemont Fillers, NP   [place] Pod as a scribe for Lemont Fillers, NP.,have documented all relevant documentation on the behalf of Lemont Fillers, NP,as directed by  Lemont Fillers,  NP while in the presence of Lemont Fillers, NP.

## 2022-08-20 NOTE — Assessment & Plan Note (Signed)
Continues healthy diet, exercise.  Immunizations reviewed and up to date.  Pap/mammo and dexa are also up to date.

## 2022-08-20 NOTE — Assessment & Plan Note (Signed)
She is being followed by GYN with serial US's.

## 2022-08-20 NOTE — Assessment & Plan Note (Signed)
She has multiple symptoms concerning for hyperthyroid- hyper-defecation, hand tremor, heat intolerance. States mother had hyperthyroid.  Will obtain TFT's.  She is on biotin.   Wt Readings from Last 3 Encounters:  08/20/22 133 lb 9.6 oz (60.6 kg)  06/18/22 130 lb (59 kg)  11/25/21 136 lb 12.8 oz (62.1 kg)

## 2022-08-21 ENCOUNTER — Telehealth: Payer: Self-pay | Admitting: Family

## 2022-08-21 DIAGNOSIS — E559 Vitamin D deficiency, unspecified: Secondary | ICD-10-CM

## 2022-08-21 DIAGNOSIS — E785 Hyperlipidemia, unspecified: Secondary | ICD-10-CM

## 2022-08-21 MED ORDER — VITAMIN D (ERGOCALCIFEROL) 1.25 MG (50000 UNIT) PO CAPS: 50000.0000 [IU] | ORAL_CAPSULE | ORAL | 0 refills | Status: DC

## 2022-08-21 NOTE — Telephone Encounter (Signed)
Vitamin D level is low.  Advise patient to begin vit D 50000 units once weekly for 12 weeks, then repeat vit D level (dx Vit D deficiency).     Also, cholesterol has risen some.  She is on the borderline for recommending statin therapy.  Would she like to start medication or would she prefer to continue to work on diet/exercise?

## 2022-08-22 MED ORDER — ATORVASTATIN CALCIUM 20 MG PO TABS: 20.0000 mg | ORAL_TABLET | Freq: Every day | ORAL | 1 refills | Status: DC

## 2022-08-22 NOTE — Telephone Encounter (Signed)
Rx sent for atorvastatin. She can repeat lipid panel when she does vit D lab follow up.

## 2022-10-10 ENCOUNTER — Telehealth: Payer: Self-pay | Admitting: Pharmacy Technician

## 2022-10-10 NOTE — Telephone Encounter (Signed)
Patient Advocate Encounter   Received notification that prior authorization for Nurtec 75MG  dispersible tablets is required.   PA submitted on 10/10/2022 [employer] Electronic PA Form Status is pending

## 2022-10-15 NOTE — Telephone Encounter (Signed)
Patient Advocate Encounter  Prior Authorization for Nurtec 75MG  dispersible tablets has been approved through [employer].    Key: [id number]  Effective: 09-10-2022 to 10-10-2023

## 2022-11-25 ENCOUNTER — Ambulatory Visit (INDEPENDENT_AMBULATORY_CARE_PROVIDER_SITE_OTHER): Payer: Medicare Other | Admitting: Family

## 2022-11-25 VITALS — BP 129/63 | HR 76 | Temp 98.0°F | Resp 16 | Wt 134.0 lb

## 2022-11-25 DIAGNOSIS — E559 Vitamin D deficiency, unspecified: Secondary | ICD-10-CM | POA: Diagnosis not present

## 2022-11-25 DIAGNOSIS — R6889 Other general symptoms and signs: Secondary | ICD-10-CM | POA: Diagnosis not present

## 2022-11-25 DIAGNOSIS — E785 Hyperlipidemia, unspecified: Secondary | ICD-10-CM

## 2022-11-25 NOTE — Assessment & Plan Note (Signed)
Completed 12-week supplementation course. -Check Vitamin D level today.

## 2022-11-25 NOTE — Progress Notes (Unsigned)
NEUROLOGY FOLLOW UP OFFICE NOTE  Joanna Lambert [id number]  Assessment/Plan:   Migraine without aura, without status migrainosus, not intractable   Migraine prevention:  Not indicated Migraine rescue:  She will continue rizatriptan as first line for now.  She will take Ubrelvy as second line (alone or with second dose of rizatriptan) *** Limit use of pain relievers to no more than 2 days out of week to prevent risk of rebound or medication-overuse headache. Keep headache diary Follow up one year ***       Subjective:  Joanna Lambert is a 67 year old female who follows migraines.  UPDATE: Intensity:  *** Duration:  *** Frequency:  *** Frequency of abortive medication: *** Current NSAIDS/analgesics:  ASA 81mg  Current triptans:  rizatriptan10mg  Current ergotamine:  none Current anti-emetic:  none Current muscle relaxants:  none Current Antihypertensive medications:  none Current Antidepressant medications:  none Current Anticonvulsant medications:  none Current anti-CGRP:  none Current Vitamins/Herbal/Supplements:  D, Ca, biotin Current Antihistamines/Decongestants:  none Other therapy:  estradiol       Caffeine:  1 cup of coffee daily Diet:  Drinks a lot of water.  No soda.  Exercise:  routine Depression:  no; Anxiety:  no Other pain:  no Sleep hygiene:  Sleeps 6 hours a night.   Family history of headache:  no  HISTORY:  Onset:  67 years old during pregnancy with her son.  They were cyclic around her menstrual cycle.  Following menopause, they became sporadic.  Other triggers include sunlight, foods with nitrates (bacon), alcohol.  Estradiol helped for several years but was stopped due to increased risk for migraines.  Now they occur on average 3 times a month.  They are now moderate, left frontal (sometimes bilateral) pressure associated with nausea, photophobia, phonophobia but no vomiting.  Has had a visual aura (geometric pattern or dots) about 6 times in her life.   They typically last 6 hours with rizatriptan but may have a postdrome headache and may return next day (occurs 1/3 of time)     Past NSAIDS/analgesics:  ibuprofen, naproxen, acetaminophen, Excedrin Past abortive triptans:  sumatriptan  Past abortive ergotamine:  none Past muscle relaxants:  none Past anti-emetic:  promethazine Past antihypertensive medications:  none Past antidepressant medications:  amitriptyline Past anticonvulsant medications:  topiramate Past anti-CGRP:  none      History of concussions as a child (2 were formally diagnosed as concussions)  PAST MEDICAL HISTORY: Past Medical History:  Diagnosis Date   Allergy    seasonal   COVID    05/2022   Heart murmur    Migraines    Ovarian cyst    right   Restless legs    Varicose veins    Vitamin D deficiency 12/26/2014    MEDICATIONS: Current Outpatient Medications on File Prior to Visit  Medication Sig Dispense Refill   aspirin 81 MG tablet Take 81 mg by mouth daily.     atorvastatin (LIPITOR) 20 MG tablet Take 1 tablet (20 mg total) by mouth daily. 90 tablet 1   Biotin 5000 MCG CAPS Take by mouth.     calcium carbonate (OS-CAL - DOSED IN MG OF ELEMENTAL CALCIUM) 1250 (500 Ca) MG tablet Take by mouth.     clotrimazole-betamethasone (LOTRISONE) cream APPLY EXTERNALLY TO THE AFFECTED AREA TWICE DAILY 45 g 1   EPINEPHrine (EPIPEN 2-PAK) 0.3 mg/0.3 mL IJ SOAJ injection Inject 0.3 mg into the muscle as needed for anaphylaxis. 1 each 1   metroNIDAZOLE (  METROCREAM) 0.75 % cream Apply 1 application topically daily.  11   Minoxidil 5 % FOAM Apply topically.     Rimegepant Sulfate (NURTEC) 75 MG TBDP Take 75 mg by mouth daily as needed. 8 tablet 11   rizatriptan (MAXALT) 10 MG tablet TAKE BY MOUTH AS DIRECTED MAY REPEAT IN 2 HOURS IF NEEDED.  MAXIMUM 2 TABLETS IN 24 HOURS 10 tablet 5   Vitamin D, Ergocalciferol, (DRISDOL) 1.25 MG (50000 UNIT) CAPS capsule Take 1 capsule (50,000 Units total) by mouth every 7 (seven)  days. 12 capsule 0   No current facility-administered medications on file prior to visit.    ALLERGIES: Allergies  Allergen Reactions   Bee Venom     Local swelling   Adhesive [Tape] Rash    FAMILY HISTORY: Family History  Problem Relation Age of Onset   Diabetes Mother    Hypertension Mother 55   Hyperlipidemia Mother    Heart disease Mother        Aortic stenosis   Heart disease Father 29       pacemaker   Atrial fibrillation Father 79   Peripheral vascular disease Father        s/p Carotid Edartarectomy   Dementia Father    Migraines Daughter       Objective:  *** General: No acute distress.  Patient appears well-groomed.   Head:  Normocephalic/atraumatic Eyes:  Fundi examined but not visualized Neck: supple, no paraspinal tenderness, full range of motion Heart:  Regular rate and rhythm Neurological Exam: alert and oriented.  Speech fluent and not dysarthric, language intact.  CN II-XII intact. Bulk and tone normal, muscle strength 5/5 throughout.  Sensation to light touch intact.  Deep tendon reflexes 2+ throughout, toes downgoing.  Finger to nose testing intact.  Gait normal, Romberg negative.   Shon Millet, DO  CC: Joanna Craze, NP

## 2022-11-25 NOTE — Assessment & Plan Note (Signed)
Normal TFT's. Will continue to monitor.

## 2022-11-25 NOTE — Patient Instructions (Signed)
VISIT SUMMARY:  During your visit, we discussed your symptoms of heat intolerance and heart palpitations, your headaches after starting a statin medication, and your completed course of vitamin D supplementation. We also discussed your general health maintenance and follow-up plans.  YOUR PLAN:  -HYPERLIPIDEMIA: Hyperlipidemia is a condition where there are high levels of fats (lipids) in the blood. We will check your lipid panel today. Depending on the results, we may consider increasing your Atorvastatin dosage to 20mg  daily after summer.  -VITAMIN D DEFICIENCY: Vitamin D deficiency is when your body doesn't have enough of the vitamin D it needs. We will check your Vitamin D level today.  -HEAT INTOLERANCE: Heat intolerance is when you're extremely sensitive to hot temperatures and palpitations are feelings of having a fast-beating, Your thyroid function testing was normal which is reassuring. We will continue to monitor this.   -GENERAL HEALTH MAINTENANCE: This includes routine vaccinations and screenings. You are due for a pneumococcal vaccination (Prevnar 20) due to your age. Please schedule a nurse visit for administration on a Friday. We also recommend a flu shot and COVID booster in September. Your colonoscopy, tetanus, and shingles vaccines are up to date. We will discuss your Pap smear in 2027.  INSTRUCTIONS:  Please schedule a nurse visit for your pneumococcal vaccination on a Friday. We also recommend getting a flu shot and COVID booster in September. We will follow up in 6 months.

## 2022-11-25 NOTE — Assessment & Plan Note (Addendum)
Lab Results  Component Value Date   CHOL 245 (H) 08/20/2022   HDL 55.50 08/20/2022   LDLCALC 171 (H) 08/20/2022   TRIG 93.0 08/20/2022   CHOLHDL 4 08/20/2022   Hyperlipidemia: On Atorvastatin 10mg  (half of 20mg  tablet) due to headaches with full dose. No headaches on current dose. Willing to try full dose after summer depending on lab results. -Check lipid panel today.

## 2022-11-25 NOTE — Progress Notes (Signed)
Subjective:     Patient ID: Joanna Lambert, female    DOB: 1956-03-20, 67 y.o.   MRN: [id number]  Chief Complaint  Patient presents with   Vitamin D deficiency    Here for follow up   Hyperlipidemia    Here for follow up    Hyperlipidemia    Discussed the use of AI scribe software for clinical note transcription with the patient, who gave verbal consent to proceed.  History of Present Illness   Joanna Lambert, a patient with a history of thyroid issues, presents with symptoms of heat intolerance. These symptoms occur a couple of times a week and can come on even when the patient is at rest. The patient describes the sensation as a fluttering in the chest. The patient had suspected these symptoms were related to her thyroid, but recent tests have come back normal.  In addition to these symptoms, the patient also reports experiencing headaches after starting a statin medication. The headaches were daily and significant enough for the patient to reduce the dosage of the medication. On the lower dosage, the headaches have ceased. The patient plans to try the full dosage again in the future to see if the headaches return.  The patient has also been taking a vitamin D supplement and has completed her 12-week course. The patient is due for a pneumonia shot but wishes to schedule it for a weekend to manage potential side effects.       Allergies  Allergen Reactions   Bee Venom     Local swelling   Adhesive [Tape] Rash    ROS See HPI    Objective:    Physical Exam Constitutional:      General: She is not in acute distress.    Appearance: Normal appearance. She is well-developed.  HENT:     Head: Normocephalic and atraumatic.     Right Ear: External ear normal.     Left Ear: External ear normal.  Eyes:     General: No scleral icterus. Neck:     Thyroid: No thyromegaly.  Cardiovascular:     Rate and Rhythm: Normal rate and regular rhythm.     Heart sounds: Normal heart sounds. No  murmur heard. Pulmonary:     Effort: Pulmonary effort is normal. No respiratory distress.     Breath sounds: Normal breath sounds. No wheezing.  Musculoskeletal:     Cervical back: Neck supple.  Skin:    General: Skin is warm and dry.  Neurological:     Mental Status: She is alert and oriented to person, place, and time.  Psychiatric:        Mood and Affect: Mood normal.        Behavior: Behavior normal.        Thought Content: Thought content normal.        Judgment: Judgment normal.      BP 129/63 (BP Location: Right Arm, Patient Position: Sitting, Cuff Size: Small)   Pulse 76   Temp 98 F (36.7 C) (Oral)   Resp 16   Wt 134 lb (60.8 kg)   LMP 05/12/2013   SpO2 98%   BMI 23.36 kg/m  Wt Readings from Last 3 Encounters:  11/25/22 134 lb (60.8 kg)  08/20/22 133 lb 9.6 oz (60.6 kg)  06/18/22 130 lb (59 kg)       Assessment & Plan:   Problem List Items Addressed This Visit       Unprioritized   Vitamin D deficiency  Completed 12-week supplementation course. -Check Vitamin D level today.       Relevant Orders   VITAMIN D 25 Hydroxy (Vit-D Deficiency, Fractures)   Hyperlipidemia - Primary    Lab Results  Component Value Date   CHOL 245 (H) 08/20/2022   HDL 55.50 08/20/2022   LDLCALC 171 (H) 08/20/2022   TRIG 93.0 08/20/2022   CHOLHDL 4 08/20/2022   Hyperlipidemia: On Atorvastatin 10mg  (half of 20mg  tablet) due to headaches with full dose. No headaches on current dose. Willing to try full dose after summer depending on lab results. -Check lipid panel today.      Relevant Orders   Lipid panel   Comp Met (CMET)   Heat intolerance    Normal TFT's. Will continue to monitor.       General Health Maintenance / Followup Plans: -Pneumococcal vaccination (Prevnar 20) recommended due to age. Patient to schedule nurse visit for administration on a Friday. -Flu shot and COVID booster recommended in September. -Colonoscopy up to date. -Tetanus up to  date. -Shingles vaccines complete. -Pap smear to be discussed in 2027. -Follow-up in 6 months.  I am having Joanna Lambert maintain her aspirin, calcium carbonate, metroNIDAZOLE, clotrimazole-betamethasone, Biotin, Minoxidil, EPINEPHrine, Nurtec, rizatriptan, Vitamin D (Ergocalciferol), and atorvastatin.  No orders of the defined types were placed in this encounter.

## 2022-11-26 ENCOUNTER — Ambulatory Visit (INDEPENDENT_AMBULATORY_CARE_PROVIDER_SITE_OTHER): Payer: Medicare Other | Admitting: Neurology

## 2022-11-26 ENCOUNTER — Encounter: Payer: Self-pay | Admitting: Neurology

## 2022-11-26 VITALS — BP 130/80 | HR 86 | Ht 64.0 in | Wt 133.2 lb

## 2022-11-26 DIAGNOSIS — G43009 Migraine without aura, not intractable, without status migrainosus: Secondary | ICD-10-CM | POA: Diagnosis not present

## 2022-11-26 LAB — LIPID PANEL
Cholesterol: 134 mg/dL (ref 0–200)
HDL: 55.1 mg/dL (ref >39.00)
LDL Cholesterol: 61 mg/dL (ref 0–99)
NonHDL: 79.23
Total CHOL/HDL Ratio: 2
Triglycerides: 89 mg/dL (ref 0.0–149.0)
VLDL: 17.8 mg/dL (ref 0.0–40.0)

## 2022-11-26 LAB — COMPREHENSIVE METABOLIC PANEL
ALT: 16 U/L (ref 0–35)
AST: 19 U/L (ref 0–37)
Albumin: 4.6 g/dL (ref 3.5–5.2)
Alkaline Phosphatase: 90 U/L (ref 39–117)
BUN: 14 mg/dL (ref 6–23)
CO2: 31 mEq/L (ref 19–32)
Calcium: 10.2 mg/dL (ref 8.4–10.5)
Chloride: 100 mEq/L (ref 96–112)
Creatinine, Ser: 0.94 mg/dL (ref 0.40–1.20)
GFR: 63.83 mL/min (ref >60.00)
Glucose, Bld: 88 mg/dL (ref 70–99)
Potassium: 4.9 mEq/L (ref 3.5–5.1)
Sodium: 139 mEq/L (ref 135–145)
Total Bilirubin: 0.4 mg/dL (ref 0.2–1.2)
Total Protein: 7.2 g/dL (ref 6.0–8.3)

## 2022-11-26 LAB — VITAMIN D 25 HYDROXY (VIT D DEFICIENCY, FRACTURES): VITD: 50.17 ng/mL (ref 30.00–100.00)

## 2022-11-26 MED ORDER — NURTEC 75 MG PO TBDP: 75.0000 mg | ORAL_TABLET | Freq: Every day | ORAL | 11 refills | Status: DC | PRN

## 2022-11-26 MED ORDER — RIZATRIPTAN BENZOATE 10 MG PO TABS: ORAL_TABLET | ORAL | 11 refills | Status: AC

## 2023-01-29 ENCOUNTER — Ambulatory Visit
Admission: RE | Admit: 2023-01-29 | Discharge: 2023-01-29 | Disposition: A | Discharge status: Home / Self Care | Payer: Medicare Other | Source: Ambulatory Visit | Attending: Internal Medicine | Admitting: Internal Medicine

## 2023-01-29 VITALS — BP 152/89 | HR 72 | Temp 97.6°F | Resp 16 | Ht 64.0 in | Wt 135.0 lb

## 2023-01-29 DIAGNOSIS — N950 Postmenopausal bleeding: Secondary | ICD-10-CM | POA: Diagnosis not present

## 2023-01-29 DIAGNOSIS — R350 Frequency of micturition: Secondary | ICD-10-CM | POA: Insufficient documentation

## 2023-01-29 DIAGNOSIS — N939 Abnormal uterine and vaginal bleeding, unspecified: Secondary | ICD-10-CM

## 2023-01-29 LAB — POCT URINALYSIS DIP (MANUAL ENTRY)
Bilirubin, UA: NEGATIVE
Blood, UA: NEGATIVE
Glucose, UA: NEGATIVE mg/dL
Ketones, POC UA: NEGATIVE mg/dL
Leukocytes, UA: NEGATIVE
Nitrite, UA: NEGATIVE
Protein Ur, POC: NEGATIVE mg/dL
Spec Grav, UA: 1.01 (ref 1.010–1.025)
Urobilinogen, UA: 0.2 E.U./dL
pH, UA: 6.5 (ref 5.0–8.0)

## 2023-01-29 NOTE — ED Provider Notes (Signed)
[facility]    CSN: [id number] Arrival date & time: 01/29/23  0830      History   Chief Complaint Chief Complaint  Patient presents with   Urinary Frequency    I think I may have a urinary track infection or yeast infection, I am also having some vaginal spotting. - Entered by patient    HPI Joanna Lambert is a 67 y.o. female.   Joanna Lambert is a 67 y.o. female presenting for chief complaint of urinary frequency, vaginal spotting, and suprapubic abdominal discomfort that started 2 days ago. She used a q-tip to the vaginal area this morning to confirm spotting/bleeding is from vaginal area versus urinary region. Suprapubic cramping is mild and currently rated 4/10. No low back pain, flank pain, dizziness, fever/chills, N/V/D, constipation, urinary odor, dysuria, urinary urgency, vaginal discharge, vaginal itching/odor, or rash to the genitourinary area. She does state she has an area to the perineum near where her episiotomy was performed years ago where the skin is thin and intermittently bleeds. She has had vaginal spotting in the past and her OB/GYN is following this. Pelvic ultrasound in March 2024 unremarkable for uterine mass/fibroid. She has a remote history of uterine fibroids. History of appendicitis with surgical resection of the appendix, otherwise no history of abdominal surgeries. She does not take an SGLT-2 inhibitor, is not a diabetic, and denies frequent intake of urinary irritants. She has not attempted use of any OTC medications PTA.     Urinary Frequency    Past Medical History:  Diagnosis Date   Allergy    seasonal   COVID    05/2022   Heart murmur    Migraines    Ovarian cyst    right   Restless legs    Varicose veins    Vitamin D deficiency 12/26/2014    Patient Active Problem List   Diagnosis Date Noted   Heat intolerance 08/20/2022   Hyperlipidemia 08/20/2022   Encounter for general adult medical examination with abnormal findings  07/29/2021   Bee allergy status 08/06/2016   Trigger finger, acquired 01/16/2015   Immunization counseling 12/26/2014   Vitamin D deficiency 12/26/2014   Migraines 09/05/2014   Ovarian cyst 09/05/2014   Irritable bowel syndrome with constipation 10/13/2011   Allergic rhinitis, seasonal 02/12/2011   GERD (gastroesophageal reflux disease) 02/12/2011    Past Surgical History:  Procedure Laterality Date   APPENDECTOMY     BREAST BIOPSY     BREAST SURGERY Right 2012   biopsy, benign cyst   CARPAL TUNNEL RELEASE Bilateral 2010   COLONOSCOPY  06/13/2016   Dr.Nandigam   FOOT SURGERY Right 2011   bunion removal   POLYPECTOMY      OB History     Gravida  2   Para  2   Term  2   Preterm      AB  0   Living  2      SAB      IAB      Ectopic      Multiple      Live Births  2            Home Medications    Prior to Admission medications   Medication Sig Start Date End Date Taking? Authorizing Provider  aspirin 81 MG tablet Take 81 mg by mouth daily.   Yes [provider]  atorvastatin (LIPITOR) 20 MG tablet Take 1 tablet (20 mg total) by mouth daily. 08/22/22  Yes  Sandford Craze, NP  Biotin 5000 MCG CAPS Take by mouth.   Yes [provider]  calcium carbonate (OS-CAL - DOSED IN MG OF ELEMENTAL CALCIUM) 1250 (500 Ca) MG tablet Take by mouth.   Yes [provider]  clotrimazole-betamethasone (LOTRISONE) cream APPLY EXTERNALLY TO THE AFFECTED AREA TWICE DAILY 12/04/17  Yes Harrington Challenger, NP  EPINEPHrine (EPIPEN 2-PAK) 0.3 mg/0.3 mL IJ SOAJ injection Inject 0.3 mg into the muscle as needed for anaphylaxis. 07/29/21  Yes Sandford Craze, NP  metroNIDAZOLE (METROCREAM) 0.75 % cream Apply 1 application topically daily. 04/28/16  Yes [provider]  Minoxidil 5 % FOAM Apply topically.   Yes [provider]  Rimegepant Sulfate (NURTEC) 75 MG TBDP Take 1 tablet (75 mg total) by mouth daily as needed. 11/26/22  Yes  Jaffe, Adam R, DO  rizatriptan (MAXALT) 10 MG tablet TAKE BY MOUTH AS DIRECTED MAY REPEAT IN 2 HOURS IF NEEDED.  MAXIMUM 2 TABLETS IN 24 HOURS 11/26/22  Yes Jaffe, Adam R, DO  Vitamin D, Ergocalciferol, (DRISDOL) 1.25 MG (50000 UNIT) CAPS capsule Take 1 capsule (50,000 Units total) by mouth every 7 (seven) days. 08/21/22   Sandford Craze, NP    Family History Family History  Problem Relation Age of Onset   Diabetes Mother    Hypertension Mother 67   Hyperlipidemia Mother    Heart disease Mother        Aortic stenosis   Heart disease Father 53       pacemaker   Atrial fibrillation Father 75   Peripheral vascular disease Father        s/p Carotid Edartarectomy   Dementia Father    Migraines Daughter     Social History Social History   Tobacco Use   Smoking status: Never   Smokeless tobacco: Never  Vaping Use   Vaping status: Never Used  Substance Use Topics   Alcohol use: Yes    Comment: once a month per pt-wine   Drug use: No     Allergies   Bee venom and Adhesive [tape]   Review of Systems Review of Systems  Genitourinary:  Positive for frequency.  Per HPI   Physical Exam Triage Vital Signs ED Triage Vitals  Encounter Vitals Group     BP 01/29/23 0834 (!) 152/89     Systolic BP Percentile --      Diastolic BP Percentile --      Pulse Rate 01/29/23 0834 72     Resp 01/29/23 0834 16     Temp 01/29/23 0834 97.6 F (36.4 C)     Temp Source 01/29/23 0834 Oral     SpO2 01/29/23 0834 99 %     Weight 01/29/23 0836 135 lb (61.2 kg)     Height 01/29/23 0836 5\' 4"  (1.626 m)     Head Circumference --      Peak Flow --      Pain Score 01/29/23 0836 4     Pain Loc --      Pain Education --      Exclude from Growth Chart --    No data found.  Updated Vital Signs BP (!) 152/89 (BP Location: Left Arm)   Pulse 72   Temp 97.6 F (36.4 C) (Oral)   Resp 16   Ht 5\' 4"  (1.626 m)   Wt 135 lb (61.2 kg)   LMP 05/12/2013   SpO2 99%   BMI 23.17 kg/m   Visual  Acuity Right Eye Distance:  Left Eye Distance:   Bilateral Distance:    Right Eye Near:   Left Eye Near:    Bilateral Near:     Physical Exam Vitals and nursing note reviewed.  Constitutional:      Appearance: She is not ill-appearing or toxic-appearing.  HENT:     Head: Normocephalic and atraumatic.     Right Ear: Hearing and external ear normal.     Left Ear: Hearing and external ear normal.     Nose: Nose normal.     Mouth/Throat:     Lips: Pink.  Eyes:     General: Lids are normal. Vision grossly intact. Gaze aligned appropriately.     Extraocular Movements: Extraocular movements intact.     Conjunctiva/sclera: Conjunctivae normal.  Pulmonary:     Effort: Pulmonary effort is normal.  Abdominal:     General: Bowel sounds are normal.     Palpations: Abdomen is soft.     Tenderness: There is no abdominal tenderness. There is no right CVA tenderness, left CVA tenderness or guarding.  Genitourinary:    Comments: Patient declined. Musculoskeletal:     Cervical back: Neck supple.  Skin:    General: Skin is warm and dry.     Capillary Refill: Capillary refill takes less than 2 seconds.     Findings: No rash.  Neurological:     General: No focal deficit present.     Mental Status: She is alert and oriented to person, place, and time. Mental status is at baseline.     Cranial Nerves: No dysarthria or facial asymmetry.  Psychiatric:        Mood and Affect: Mood normal.        Speech: Speech normal.        Behavior: Behavior normal.        Thought Content: Thought content normal.        Judgment: Judgment normal.      UC Treatments / Results  Labs (all labs ordered are listed, but only abnormal results are displayed) Labs Reviewed  POCT URINALYSIS DIP (MANUAL ENTRY) - Abnormal; Notable for the following components:      Result Value   Color, UA light yellow (*)    All other components within normal limits  CERVICOVAGINAL ANCILLARY ONLY    EKG   Radiology No  results found.  Procedures Procedures (including critical care time)  Medications Ordered in UC Medications - No data to display  Initial Impression / Assessment and Plan / UC Course  I have reviewed the triage vital signs and the nursing notes.  Pertinent labs & imaging results that were available during my care of the patient were reviewed by me and considered in my medical decision making (see chart for details).   1. Urinary frequency, vaginal spotting Unclear etiology of patient's symptoms. Suspect possible forming uterine leiomyoma given history of same. Reviewed most recent pelvic ultrasound findings from March 2024 showing stable right ovarian avascular growth without uterine fibroid/mass. Pelvic US showed thinning endometrium.  Urinalysis is unremarkable for signs of urinary tract infection. Offered genitourinary exam/inspection, patient declined. Advised to follow-up with OB/GYN for ongoing evaluation and management. She travels to [place] in 2 days and has been given strict precautions should she need to seek medial care for new or worsening symptoms while out of the country.  Counseled patient on potential for adverse effects with medications prescribed/recommended today, strict ER and return-to-clinic precautions discussed, patient verbalized understanding.   Final Clinical Impressions(s) / UC  Diagnoses   Final diagnoses:  Urinary frequency  Vaginal spotting   Discharge Instructions   None    ED Prescriptions   None    PDMP not reviewed this encounter.   Carlisle Beers, [place] 01/29/23 1326

## 2023-01-29 NOTE — ED Triage Notes (Signed)
Patient c/o urinary frequency, some vaginal spotting, lower abdominal cramping x 2 days.  Patient denies any OTC pain meds.  Denies any hematuria.

## 2023-01-30 LAB — CERVICOVAGINAL ANCILLARY ONLY
Bacterial Vaginitis (gardnerella): NEGATIVE
Candida Glabrata: NEGATIVE
Candida Vaginitis: NEGATIVE
Chlamydia: NEGATIVE
Comment: NEGATIVE
Comment: NEGATIVE
Comment: NEGATIVE
Comment: NEGATIVE
Comment: NEGATIVE
Comment: NORMAL
Neisseria Gonorrhea: NEGATIVE
Trichomonas: NEGATIVE

## 2023-02-10 ENCOUNTER — Other Ambulatory Visit (HOSPITAL_COMMUNITY)
Admission: RE | Admit: 2023-02-10 | Discharge: 2023-02-10 | Disposition: A | Discharge status: Home / Self Care | Payer: Medicare Other | Source: Ambulatory Visit | Attending: Obstetrics and Gynecology | Admitting: Obstetrics and Gynecology

## 2023-02-10 ENCOUNTER — Encounter: Payer: Self-pay | Admitting: Obstetrics and Gynecology

## 2023-02-10 ENCOUNTER — Ambulatory Visit (INDEPENDENT_AMBULATORY_CARE_PROVIDER_SITE_OTHER): Payer: Medicare Other | Admitting: Obstetrics and Gynecology

## 2023-02-10 VITALS — BP 120/82 | HR 64 | Temp 97.9°F

## 2023-02-10 DIAGNOSIS — N838 Other noninflammatory disorders of ovary, fallopian tube and broad ligament: Secondary | ICD-10-CM

## 2023-02-10 DIAGNOSIS — N950 Postmenopausal bleeding: Secondary | ICD-10-CM | POA: Diagnosis not present

## 2023-02-10 DIAGNOSIS — R102 Pelvic and perineal pain: Secondary | ICD-10-CM | POA: Diagnosis not present

## 2023-02-10 LAB — URINALYSIS, COMPLETE W/RFL CULTURE
Bacteria, UA: NONE SEEN /[HPF]
Bilirubin Urine: NEGATIVE
Glucose, UA: NEGATIVE
Hyaline Cast: NONE SEEN /[LPF]
Ketones, ur: NEGATIVE
Leukocyte Esterase: NEGATIVE
Nitrites, Initial: NEGATIVE
Protein, ur: NEGATIVE
RBC / HPF: NONE SEEN /[HPF] (ref 0–2)
Specific Gravity, Urine: 1.003 (ref 1.001–1.035)
WBC, UA: NONE SEEN /[HPF] (ref 0–5)
pH: 6 (ref 5.0–8.0)

## 2023-02-10 LAB — NO CULTURE INDICATED

## 2023-02-10 NOTE — Progress Notes (Signed)
67 y.o. y.o.G3P3002 female here for recurrent PM bleeding.  Reports uncomfortable suprapubic pain that comes and goes and was reproduced on exam today with EMB UA neg Denies any GI complaints Vaginal cultures negative  Patient's last menstrual period was 05/12/2013.    Blood pressure 120/82, pulse 64, temperature 97.9 F (36.6 C), temperature source Oral, last menstrual period 05/12/2013, SpO2 97%.     Component Value Date/Time   DIAGPAP  06/18/2022 1047    - Negative for intraepithelial lesion or malignancy (NILM)   DIAGPAP - Benign reactive/reparative changes 06/18/2022 1047   HPVHIGH Negative 06/18/2022 1047   ADEQPAP  06/18/2022 1047    Satisfactory for evaluation; transformation zone component PRESENT.    GYN HISTORY:    Component Value Date/Time   DIAGPAP  06/18/2022 1047    - Negative for intraepithelial lesion or malignancy (NILM)   DIAGPAP - Benign reactive/reparative changes 06/18/2022 1047   HPVHIGH Negative 06/18/2022 1047   ADEQPAP  06/18/2022 1047    Satisfactory for evaluation; transformation zone component PRESENT.    OB History  Gravida Para Term Preterm AB Living  2 2 2    0 2  SAB IAB Ectopic Multiple Live Births          2    # Outcome Date GA Lbr Len/2nd Weight Sex Type Anes PTL Lv  2 Term           1 Term            1 stillborn breech  Past Medical History:  Diagnosis Date   Allergy    seasonal   COVID    05/2022   Heart murmur    Migraines    Ovarian cyst    right   Restless legs    Varicose veins    Vitamin D deficiency 12/26/2014    Past Surgical History:  Procedure Laterality Date   APPENDECTOMY     BREAST BIOPSY     BREAST SURGERY Right 2012   biopsy, benign cyst   CARPAL TUNNEL RELEASE Bilateral 2010   COLONOSCOPY  06/13/2016   Dr.Nandigam   FOOT SURGERY Right 2011   bunion removal   POLYPECTOMY      Current Outpatient Medications on File Prior to Visit  Medication Sig Dispense Refill   aspirin 81 MG tablet  Take 81 mg by mouth daily.     atorvastatin (LIPITOR) 20 MG tablet Take 1 tablet (20 mg total) by mouth daily. 90 tablet 1   Biotin 5000 MCG CAPS Take by mouth.     calcium carbonate (OS-CAL - DOSED IN MG OF ELEMENTAL CALCIUM) 1250 (500 Ca) MG tablet Take by mouth.     clotrimazole-betamethasone (LOTRISONE) cream APPLY EXTERNALLY TO THE AFFECTED AREA TWICE DAILY 45 g 1   metroNIDAZOLE (METROCREAM) 0.75 % cream Apply 1 application topically daily.  11   Minoxidil 5 % FOAM Apply topically.     Rimegepant Sulfate (NURTEC) 75 MG TBDP Take 1 tablet (75 mg total) by mouth daily as needed. 8 tablet 11   rizatriptan (MAXALT) 10 MG tablet TAKE BY MOUTH AS DIRECTED MAY REPEAT IN 2 HOURS IF NEEDED.  MAXIMUM 2 TABLETS IN 24 HOURS 10 tablet 11   Vitamin D, Ergocalciferol, (DRISDOL) 1.25 MG (50000 UNIT) CAPS capsule Take 1 capsule (50,000 Units total) by mouth every 7 (seven) days. 12 capsule 0   EPINEPHrine (EPIPEN 2-PAK) 0.3 mg/0.3 mL IJ SOAJ injection Inject 0.3 mg into the muscle as needed for  anaphylaxis. (Patient not taking: Reported on 02/10/2023) 1 each 1   No current facility-administered medications on file prior to visit.    Social History   Socioeconomic History   Marital status: Married    Spouse name: Not on file   Number of children: Not on file   Years of education: Not on file   Highest education level: Associate degree: academic program  Occupational History   Not on file  Tobacco Use   Smoking status: Never   Smokeless tobacco: Never  Vaping Use   Vaping status: Never Used  Substance and Sexual Activity   Alcohol use: Yes    Comment: once a month per pt-wine   Drug use: No   Sexual activity: Yes    Partners: Male    Birth control/protection: Post-menopausal    Comment: 1st intercourse 67 yo-Fewer than 5 partners  Other Topics Concern   Not on file  Social History Narrative   Married   2 children (daughter lives here- she has two sons)   Son lives in [place]   Retired from  Chief of Staff (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     Left handed   Social Determinants of Health   Financial Resource Strain: Low Risk  (11/18/2022)   Overall Financial Resource Strain (CARDIA)    Difficulty of Paying Living Expenses: Not hard at all  Food Insecurity: No Food Insecurity (11/18/2022)   Hunger Vital Sign    Worried About Running Out of Food in the Last Year: Never true    Ran Out of Food in the Last Year: Never true  Transportation Needs: No Transportation Needs (11/18/2022)   PRAPARE - Administrator, Civil Service (Medical): No    Lack of Transportation (Non-Medical): No  Physical Activity: Sufficiently Active (11/18/2022)   Exercise Vital Sign    Days of Exercise per Week: 5 days    Minutes of Exercise per Session: 30 min  Stress: No Stress Concern Present (11/18/2022)   [employer] of Occupational Health - Occupational Stress Questionnaire    Feeling of Stress : Not at all  Social Connections: Socially Integrated (11/18/2022)   Social Connection and Isolation Panel [NHANES]    Frequency of Communication with Friends and Family: Once a week    Frequency of Social Gatherings with Friends and Family: Twice a week    Attends Religious Services: 1 to 4 times per year    Active Member of [employer] or Organizations: Yes    Attends Banker Meetings: 1 to 4 times per year    Marital Status: Married  Catering manager Violence: Not on file    Family History  Problem Relation Age of Onset   Diabetes Mother    Hypertension Mother 3   Hyperlipidemia Mother    Heart disease Mother        Aortic stenosis   Heart disease Father 80       pacemaker   Atrial fibrillation Father 29   Peripheral vascular disease Father        s/p Carotid Edartarectomy   Dementia Father    Migraines Daughter      Allergies  Allergen Reactions   Bee Venom     Local swelling   Adhesive [Tape] Rash      Patient's last menstrual  period was Patient's last menstrual period was 05/12/2013.Marland Kitchen            Review of Systems Alls systems reviewed  and are negative.     Physical Exam Exam conducted with a chaperone present.  Genitourinary:    Vagina: Normal.     Cervix: Normal.     Uterus: Deviated.      Comments: Atrophic Retroverted uterus Normal discharge  No CMT    PROCEDURE: EMB Consent obtained for the procedure.  A bivalve speculum was placed in the vagina.  The cervix was grasped with a single tooth tenaculum.  Uterus measures to 6cm. Pipelle was inserted and rotated.  Adequate specimen was obtained and sent to pathology.  All instruments were removed.  Procedure was difficult for the patient. To notify patient of the results.    A:        recurrent PM bleeding, pelvic pain, stable ovarian mass                             P:         Earley Favor All options discussed with patient.  She does not want to keep going through procedures in the future for PM bleeding and would like a definitive treatment.  She does not want to worry about a future gyn cancer.    Counseled on all options.  She would like to have the [facility].  Counseled extensively on the procedure including but not limited to what to expect and risks and benefits.  Counseled on postop care and pelvic rest for 10 weeks after the surgery with restricted lifting for 6 weeks after.  Counseled on the benefits of the robotic procedure with faster return to daily activities, improved outcomes, and less risk for complications. She would like to have this scheduled.  Discussed removal of both ovaries and tubes as well and she agreed.   2. EMB completed today- to notify patient of the results.

## 2023-02-10 NOTE — Patient Instructions (Addendum)
Hysterectomy Information °A hysterectomy is a surgery in which the uterus is removed. The lowest part of the uterus (cervix), which opens into the vagina, may be removed as well. In some cases, the fallopian tubes, the ovaries,  or both the fallopian tubes and the ovaries may also be removed. °This procedure may be done to treat different medical problems. It may also be done to help transgender men feel more masculine. After the procedure, a woman will no longer have menstrual periods and will not be able to become pregnant (sterile). °What are the reasons for a hysterectomy? °There are many reasons why a person might have this procedure. They include: °Persistent, abnormal vaginal bleeding. °Long-term (chronic) pelvic pain or infection. °Endometriosis. This is when the lining of the uterus (endometrium) starts to grow outside the uterus. °Adenomyosis. This is when the endometrium starts to grow in the muscle of the uterus. °Pelvic organ prolapse. This is a condition in which the uterus falls down into the vagina. °Noncancerous growths in the uterus (uterine fibroids) that cause symptoms. °The presence of precancerous cells. °Cervical or uterine cancer. °Sex change. This helps a transgender man complete his female identity. °What are the different types of hysterectomy? °There are three different types of hysterectomy: °Supracervical hysterectomy. In this type, the top part of the uterus is removed, but not the cervix. °Total hysterectomy. In this type, the uterus and cervix are removed. °Radical hysterectomy. In this type, the uterus, the cervix, and the tissue that holds the uterus in place (parametrium) are removed. °What are the different ways a hysterectomy can be performed? °There are many different ways a hysterectomy can be performed, including: °Abdominal hysterectomy. In this type, an incision is made in the abdomen. The uterus is removed through this incision. °Vaginal hysterectomy. In this type, an  incision is made in the vagina. The uterus is removed through this incision. There are no abdominal incisions. °Conventional laparoscopic hysterectomy. In this type, 3 or 4 small incisions are made in the abdomen. A thin, lighted tube with a camera (laparoscope) is inserted into one of the incisions. Other tools are put through the other incisions. The uterus is cut into small pieces. The small pieces are removed through the incisions or the vagina. °Laparoscopically assisted vaginal hysterectomy (LAVH). In this type, 3 or 4 small incisions are made in the abdomen. Part of the surgery is performed laparoscopically and the other part is done vaginally. The uterus is removed through the vagina. °Robot-assisted laparoscopic hysterectomy. In this type, a laparoscope and other tools are inserted into 3 or 4 small incisions in the abdomen. A computer-controlled device is used to give the surgeon a 3D image and to help control the surgical instruments. This allows for more precise movements of surgical instruments. The uterus is cut into small pieces and removed through the incisions or the vagina. °Discuss the options with your health care provider to determine which type is the right one for you. °What are the risks of this surgery? °Generally, this is a safe procedure. However, problems may occur, including: °Bleeding and risk of blood transfusion. Tell your health care provider if you do not want to receive any blood products. °Blood clots in the legs or lung. °Infection. °Damage to nearby structures or organs. °Allergic reactions to medicines. °Having to change to an abdominal hysterectomy after starting a less invasive technique. °What to expect after a hysterectomy °You will be given pain medicine. °You may need to stay in the hospital for 1-2 days   to recover, depending on the type of hysterectomy you had. °You will need to have someone with you for the first 3-5 days after you go home. °You will need to follow up  with your surgeon in 2-4 weeks after surgery to evaluate your progress. °If the ovaries are removed, you will have early menopause symptoms such as hot flashes, night sweats, and insomnia. °If you had a hysterectomy for a problem that was not cancer or a condition that could not lead to cancer, then you no longer need Pap tests. However, even if you no longer need a Pap test, get regular pelvic exams to make sure no other problems are developing. °Questions to ask your health care provider °Is a hysterectomy medically necessary? Do I have other treatment options for my condition? °What are my options for hysterectomy procedure? °What organs and tissues need to be removed? °What are the risks? °What are the benefits? °How long will I need to stay in the hospital after the procedure? °How long will I need to recover at home? °What symptoms can I expect after the procedure? °Summary °A hysterectomy is a surgery in which the uterus is removed. The fallopian tubes, the ovaries, or both may be removed as well. °This procedure may be done to treat different medical problems. It may also be done to complete your female identity during a sex change. °After the procedure, a woman will no longer have a menstrual period and will not be able to become pregnant. °There are three types of hysterectomy. Discuss with your health care provider which options are right for you. °This is a safe procedure, though there are some risks. Risks include infection, bleeding, blood clots, or damage to nearby organs. °This information is not intended to replace advice given to you by your health care provider. Make sure you discuss any questions you have with your health care provider. °Document Revised: 02/25/2019 Document Reviewed: 02/25/2019 °Elsevier Patient Education © 2021 Elsevier Inc. ° °

## 2023-02-16 LAB — SURGICAL PATHOLOGY

## 2023-02-23 ENCOUNTER — Telehealth: Payer: Self-pay | Admitting: *Deleted

## 2023-02-23 NOTE — Telephone Encounter (Signed)
Spoke with patient regarding surgery referral. Patient states she has not been made aware of final results of EMB, request that this be reviewed first. States she would still like to proceed with surgery once results reviewed and ok to proceed. Advised I will forward to Dr. Karma Greaser to review, our office will f/u. Patient agreeable.   Routing to Dr. Karma Greaser to review 02/10/23 path results.

## 2023-02-24 NOTE — Telephone Encounter (Signed)
They did not see endometrial cells on the biopsy, but I am confident that the pipelle was in far enough. Cancer would fill it up, so I am fine to proceed with surgery. I can always try to repeat the biopsy, however. Dr. Karma Greaser

## 2023-02-26 NOTE — Telephone Encounter (Signed)
Spoke with patient, advised of results and recommendations. Patient is requesting to proceed with surgery after first of the new year. Reviewed dates, patient request to proceed on 06/15/22. Advised patient I will forward to business office for return call. I will return call once surgery date and time confirmed. Patient verbalizes understanding and is agreeable.   Dr. Karma Greaser -ok to proceed with 06/16/23.

## 2023-04-04 ENCOUNTER — Other Ambulatory Visit: Payer: Self-pay | Admitting: Family

## 2023-04-04 DIAGNOSIS — E785 Hyperlipidemia, unspecified: Secondary | ICD-10-CM

## 2023-05-27 ENCOUNTER — Telehealth: Payer: Self-pay

## 2023-05-27 ENCOUNTER — Ambulatory Visit (INDEPENDENT_AMBULATORY_CARE_PROVIDER_SITE_OTHER): Payer: Medicare Other | Admitting: Obstetrics and Gynecology

## 2023-05-27 ENCOUNTER — Encounter: Payer: Self-pay | Admitting: Obstetrics and Gynecology

## 2023-05-27 VITALS — BP 114/70 | HR 70 | Ht 63.75 in | Wt 135.0 lb

## 2023-05-27 DIAGNOSIS — R102 Pelvic and perineal pain: Secondary | ICD-10-CM

## 2023-05-27 DIAGNOSIS — M810 Age-related osteoporosis without current pathological fracture: Secondary | ICD-10-CM

## 2023-05-27 DIAGNOSIS — N950 Postmenopausal bleeding: Secondary | ICD-10-CM

## 2023-05-27 DIAGNOSIS — M8580 Other specified disorders of bone density and structure, unspecified site: Secondary | ICD-10-CM

## 2023-05-27 DIAGNOSIS — Z01818 Encounter for other preprocedural examination: Secondary | ICD-10-CM

## 2023-05-27 MED ORDER — IBUPROFEN 800 MG PO TABS: 800.0000 mg | ORAL_TABLET | Freq: Three times a day (TID) | ORAL | 0 refills | Status: DC | PRN

## 2023-05-27 MED ORDER — METOCLOPRAMIDE HCL 10 MG PO TABS: 10.0000 mg | ORAL_TABLET | Freq: Three times a day (TID) | ORAL | 0 refills | Status: DC | PRN

## 2023-05-27 MED ORDER — OXYCODONE HCL 5 MG PO TABS
5.0000 mg | ORAL_TABLET | ORAL | 0 refills | Status: DC | PRN
Start: 2023-05-27 — End: 2023-08-12

## 2023-05-27 MED ORDER — NEOSPORIN PLUS PAIN RELIEF MS 3.5-10000-10 EX CREA: TOPICAL_CREAM | Freq: Two times a day (BID) | CUTANEOUS | 0 refills | Status: DC

## 2023-05-27 MED ORDER — ESTRADIOL 0.1 MG/GM VA CREA: TOPICAL_CREAM | VAGINAL | 12 refills | Status: AC

## 2023-05-27 MED ORDER — INTRAROSA 6.5 MG VA INST: 1.0000 | VAGINAL_INSERT | Freq: Every evening | VAGINAL | 12 refills | Status: AC | PRN

## 2023-05-27 NOTE — Patient Instructions (Addendum)
 Robotic Laparoscopic Hysterectomy, Care After  The following information offers guidance on how to care for yourself after your procedure. Your health care provider may also give you more specific instructions. If you have problems or questions, contact your health care provider. What can I expect after the procedure? After the procedure, it is common to have: Pain, bruising, and numbness around your incisions. Tiredness (fatigue). Poor appetite. Less interest in sex. Vaginal discharge or bleeding. You will need to use a sanitary pad after this procedure.  HEAVY BLEEDING LIKE A PERIOD IS NOT NORMAL.  PLEASE CALL YOUR PROVIDER IF SOAKING A PAD. Feelings of sadness or other emotions. If your ovaries were also removed, it is also common to have symptoms of menopause, such as hot flashes, night sweats, and lack of sleep (insomnia).  Ovaries should stay in if at all possible until at least the age of 24. Follow these instructions at home: Medicines Take over-the-counter and prescription medicines only as told by your health care provider. Ask your health care provider if the medicine prescribed to you: Requires you to avoid driving or using machinery. Can cause constipation. You may need to take these actions to prevent or treat constipation: Drink enough fluid to keep your urine pale yellow. Take over-the-counter or prescription medicines. Eat foods that are high in fiber, such as beans, whole grains, and fresh fruits and vegetables. Limit foods that are high in fat and processed sugars, such as fried or sweet foods.  Also, avoid spicy foods.  NAUSEA IS COMMON THE FIRST NIGHT OF SURGERY.  IF IT LASTS BEYOND 24 HOURS, CALL YOUR PROVIDER.  NAUSEA MEDICATION WAS GIVEN AT YOUR PREOP APPOINTMENT THAT YOU CAN TAKE AFTER SURGERY. Incision care  Follow instructions from your health care provider about how to take care of your incisions. Make sure you: LEAVE INCISION OPEN AND DRY-NO BANDAGES Leave  stitches (sutures), skin glue, or adhesive strips in place UNTIL 2 WEEKS THEN REMOVE IN THE SHOWER.  If adhesive strip edges start to loosen and curl up, you may trim the loose edges. Check your incision areas every day for signs of infection. Check for: More redness, swelling, or pain. Fluid or blood. Warmth. Pus or a bad smell. Activity  Rest as told by your health care provider. Avoid sitting for a long time without moving. Get up to take short walks every 1-2 hours. This is important to improve blood flow and breathing. Ask for help if you feel weak or unsteady. Return to your normal activities as told by your health care provider. Ask your health care provider what activities are safe for you. Do not lift anything that is heavier than 10 lb (4.5 kg), or the limit that you are told, for 8 WEEKS after surgery or until your health care provider says that it is safe. If you were given a sedative during the procedure, it can affect you for several hours. Do not drive or operate machinery until your health care provider says that it is safe. Lifestyle Do not use any products that contain nicotine or tobacco. These products include cigarettes, chewing tobacco, and vaping devices, such as e-cigarettes. These can delay healing after surgery. If you need help quitting, ask your health care provider. Do not drink alcohol until your health care provider approves. General instructions FOR 2 WEEKS AFTER SURGERY, THEN YOU MAY USE TUBS AND HOT TUBS Do not douche, use tampons, or have sex for at least 10 weeks, or as told by your health care  provider. If you struggle with physical or emotional changes after your procedure, speak with your health care provider or a therapist. Do not take baths, swim, or use a hot tub until your health care provider approves. You may only be allowed to take showers for 2 weeks. IF YOU HAVE BURNING WITH URINATION, PLEASE CALL YOUR DOCTOR. BLADDER INFECTIONS MAY OCCUR AFTER  SURGERY Try to have someone at home with you for the first 1-2 weeks to help with your daily chores. Wear compression stockings as told by your health care provider. These stockings help to prevent blood clots and reduce swelling in your legs. Keep all follow-up visits. This is important. Contact a health care provider if: You have any of these signs of infection: Chills or a fever 149f OR GREATER. More redness, swelling, or pain around an incision. Fluid or blood coming from an incision. Warmth coming from an incision. Pus or a bad smell coming from an incision. Burning with urination. Urinary frequency or cramping.   IF YOU HAVE THESE SYMPTOMS, PLEASE CALL THE OFFICE TO COME EVALUATE FOR A BLADDER INFECTION AT [phone] An incision opens. You feel dizzy or light-headed. You have pain or bleeding when you urinate, or you are unable to urinate. You have abnormal vaginal discharge. You have pain that does not get better with medicine. Get help right away if: You have a fever and your symptoms suddenly get worse. You have severe abdominal pain. You have chest pain or shortness of breath. You may have chest pain and shortness of breath from the CO2 gas for a few days after surgery.  This is very common.  Walking, Gas-X and motrin  will usually help relieve this discomfort You faint. You have pain, swelling, or redness in your leg. You have heavy vaginal bleeding with blood clots, soaking through a sanitary pad in less than 1 hour. These symptoms may represent a serious problem that is an emergency. Do not wait to see if the symptoms will go away. Get medical help right away. Call your local emergency services (911 in the [place] Do not drive yourself to the hospital. Summary  CONSTIPATION MEDICATION AFTER SURGERY: COLACE, MOM, MIRALAX, GAS X are all helpful to have on hand, if needed.  FILL ALL POSTOP MEDICATION BEFORE SURGERY  After the procedure, it is common to have pain and bruising  around your incisions. Do not take baths, swim, or use a hot tub until your health care provider approves. Do not lift anything that is heavier than 8- 10 lb (4.5 kg), or the limit that you are told, for one month after surgery or until your health care provider says that it is safe. Tell your health care provider if you have any signs or symptoms of infection after the procedure. Get help right away if you have severe abdominal pain, chest pain, shortness of breath, or heavy bleeding from your vagina. This information is not intended to replace advice given to you by your  health care provider. Make sure you discuss any questions you have with your health care provider. Document Revised: 12/29/2019 Document Reviewed: 12/30/2019 Elsevier Patient Education  2024 Elsevier Inc.  LABIAL PLASTY POST CARE Keep ice with perineal ice packs ([employer] has a variety to choose from) for 1-2 days to reduce swelling.  Final healing may take up to 6-8 weeks after.  There may be poor cosmesis meaning it does not look like what you expect or it can heal differently with asymmetry with possible desire for additional  procedure in the future.  This is also a common place for infection and will need close care. Sit on rolled towel or perineal pillow, if this feels better Use neosporin  with lidocaine  2 to 3 times a day, as needed for pain Use daily estrogen cream to the area for healing.  You can also use the daily intrarosa  at the same time Monitor with a mirror daily. Return with any bruising, swelling worsening pain, fevers or pussy like discharge or redness. Wash area with perineal bottle after voiding and bowel movements ([employer] has these as well).   You may have spotting from the area.

## 2023-05-27 NOTE — Progress Notes (Signed)
 PREOP H&P 68 y.o. y.o.G3P3002 female here for recurrent PM bleeding for preop for RLH/BSO, cystoscopy.  She also reports bothersome enlarged right labia that catches on her underwear and causes discomfort during intercourse and when voiding affects the stream.  She would like to have this labia reduced to be of similar size to the left labia.  Counseled on risks of removing such as poor cosmesis, bleeding, pain, infection, swelling   She voices understanding and would like to proceed with the labial plasty and the time of the RLH/BSO/cystoscopy  Still having the uterine cramping that comes and goes. No further vaginal bleeding. UA neg Denies any GI complaints Vaginal cultures negative  Patient's last menstrual period was 05/12/2013.    Blood pressure 114/70, pulse 70, height 5' 3.75" (1.619 m), weight 135 lb (61.2 kg), last menstrual period 05/12/2013, SpO2 99%.     Component Value Date/Time   DIAGPAP  06/18/2022 1047    - Negative for intraepithelial lesion or malignancy (NILM)   DIAGPAP - Benign reactive/reparative changes 06/18/2022 1047   HPVHIGH Negative 06/18/2022 1047   ADEQPAP  06/18/2022 1047    Satisfactory for evaluation; transformation zone component PRESENT.    GYN HISTORY:    Component Value Date/Time   DIAGPAP  06/18/2022 1047    - Negative for intraepithelial lesion or malignancy (NILM)   DIAGPAP - Benign reactive/reparative changes 06/18/2022 1047   HPVHIGH Negative 06/18/2022 1047   ADEQPAP  06/18/2022 1047    Satisfactory for evaluation; transformation zone component PRESENT.    OB History  Gravida Para Term Preterm AB Living  2 2 2   0 2  SAB IAB Ectopic Multiple Live Births      2    # Outcome Date GA Lbr Len/2nd Weight Sex Type Anes PTL Lv  2 Term           1 Term            1 stillborn breech  Past Medical History:  Diagnosis Date   Allergy    seasonal   COVID    05/2022   Heart murmur    Migraines    Ovarian cyst    right    Restless legs    Varicose veins    Vitamin D  deficiency 12/26/2014    Past Surgical History:  Procedure Laterality Date   APPENDECTOMY     BREAST BIOPSY     BREAST SURGERY Right 2012   biopsy, benign cyst   CARPAL TUNNEL RELEASE Bilateral 2010   COLONOSCOPY  06/13/2016   Dr.Nandigam   FOOT SURGERY Right 2011   bunion removal   POLYPECTOMY      Current Outpatient Medications on File Prior to Visit  Medication Sig Dispense Refill   aspirin 81 MG tablet Take 81 mg by mouth daily.     atorvastatin  (LIPITOR) 20 MG tablet TAKE 1 TABLET(20 MG) BY MOUTH DAILY 90 tablet 1   Biotin 5000 MCG CAPS Take by mouth.     calcium  carbonate (OS-CAL - DOSED IN MG OF ELEMENTAL CALCIUM ) 1250 (500 Ca) MG tablet Take by mouth.     clotrimazole -betamethasone  (LOTRISONE ) cream APPLY EXTERNALLY TO THE AFFECTED AREA TWICE DAILY 45 g 1   metroNIDAZOLE (METROCREAM) 0.75 % cream Apply 1 application topically daily.  11   Rimegepant Sulfate (NURTEC) 75 MG TBDP Take 1 tablet (75 mg total) by mouth daily as needed. 8 tablet 11   rizatriptan  (MAXALT ) 10 MG tablet TAKE BY MOUTH AS DIRECTED  MAY REPEAT IN 2 HOURS IF NEEDED.  MAXIMUM 2 TABLETS IN 24 HOURS 10 tablet 11   EPINEPHrine  (EPIPEN  2-PAK) 0.3 mg/0.3 mL IJ SOAJ injection Inject 0.3 mg into the muscle as needed for anaphylaxis. (Patient not taking: Reported on 05/27/2023) 1 each 1   No current facility-administered medications on file prior to visit.    Social History   Socioeconomic History   Marital status: Married    Spouse name: Not on file   Number of children: Not on file   Years of education: Not on file   Highest education level: Associate degree: academic program  Occupational History   Not on file  Tobacco Use   Smoking status: Never   Smokeless tobacco: Never  Vaping Use   Vaping status: Never Used  Substance and Sexual Activity   Alcohol use: Yes    Comment: once a month per pt-wine   Drug use: No   Sexual activity: Yes    Partners:  Male    Birth control/protection: Post-menopausal    Comment: 1st intercourse 68 yo-Fewer than 5 partners  Other Topics Concern   Not on file  Social History Narrative   Married   2 children (daughter lives here- she has two sons)   Son lives in [place]   Retired from Chief of Staff (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     Left handed   Social Drivers of Health   Financial Resource Strain: Low Risk  (11/18/2022)   Overall Financial Resource Strain (CARDIA)    Difficulty of Paying Living Expenses: Not hard at all  Food Insecurity: No Food Insecurity (11/18/2022)   Hunger Vital Sign    Worried About Running Out of Food in the Last Year: Never true    Ran Out of Food in the Last Year: Never true  Transportation Needs: No Transportation Needs (11/18/2022)   PRAPARE - Administrator, Civil Service (Medical): No    Lack of Transportation (Non-Medical): No  Physical Activity: Sufficiently Active (11/18/2022)   Exercise Vital Sign    Days of Exercise per Week: 5 days    Minutes of Exercise per Session: 30 min  Stress: No Stress Concern Present (11/18/2022)   [employer] of Occupational Health - Occupational Stress Questionnaire    Feeling of Stress : Not at all  Social Connections: Socially Integrated (11/18/2022)   Social Connection and Isolation Panel [NHANES]    Frequency of Communication with Friends and Family: Once a week    Frequency of Social Gatherings with Friends and Family: Twice a week    Attends Religious Services: 1 to 4 times per year    Active Member of [employer] or Organizations: Yes    Attends Banker Meetings: 1 to 4 times per year    Marital Status: Married  Catering manager Violence: Not on file    Family History  Problem Relation Age of Onset   Diabetes Mother    Hypertension Mother 73   Hyperlipidemia Mother    Heart disease Mother        Aortic stenosis   Heart disease Father 67       pacemaker   Atrial  fibrillation Father 24   Peripheral vascular disease Father        s/p Carotid Edartarectomy   Dementia Father    Migraines Daughter      Allergies  Allergen Reactions   Bee Venom     Local swelling  Adhesive [Tape] Rash      Patient's last menstrual period was Patient's last menstrual period was 05/12/2013.Aaron Aas            Review of Systems Alls systems reviewed and are negative.    S1S2 CTA B/L Soft, NT, ND From last exam: Physical Exam Exam conducted with a chaperone present.  Genitourinary:    Vagina: Normal.     Cervix: Normal.     Uterus: Deviated.      Comments: Atrophic Retroverted uterus Normal discharge  No CMT Pain reproduced with uterine palpation     Component Ref Range & Units (hover) 3 mo ago  SURGICAL PATHOLOGY SURGICAL PATHOLOGY CASE: [id number] PATIENT: Lara Plants Surgical Pathology Report     Clinical History: PMB (cm)     FINAL MICROSCOPIC DIAGNOSIS:  A. ENDOMETRIUM, BIOPSY: -  Predominantly mucin with rare fragments of ecto and endocervix, negative for dysplasia. -  No endometrium is present in the biopsy.    GROSS DESCRIPTION:  Received in formalin is blood tinged mucus that is entirely submitted in one block.  Volume: 0.5 x 0.4 x 0.1 cm (1 B)  (KW, 02/11/2023)       A:        recurrent PM bleeding, pelvic pain, stable ovarian mass, osteoporosis, asymmetric labia                             P:         Reinaldo Caras All options discussed with patient.  She does not want to keep going through procedures in the future for PM bleeding and would like a definitive treatment.  She does not want to worry about a future gyn cancer.    Counseled on all options.  She would like to have the [facility].  Counseled extensively on the procedure including but not limited to what to expect and risks and benefits.  Counseled on postop care and pelvic rest for 10 weeks after the surgery with restricted lifting for 6 weeks after.   Counseled on the benefits of the robotic procedure with faster return to daily activities, improved outcomes, and less risk for complications. She would like to have this scheduled.  Discussed removal of both ovaries and tubes as well and she agreed.   2. EMB  No endometrial cells seen  Patient counseled and confident in biopsy as far as being in the uterus and far enough in. Comfortable with proceeding with surgery and not putting patient through separate repeat procedure. Counseled above on labial plasty and r/b/a/I. Patient is miserable with the labial and is willing to take risks with surgery and cosmesis.  Counseled on postcare instructions and placed in wrap up for this.  The risks of surgery were discussed in detail with the patient including but not limited to: bleeding which may require transfusion or reoperation; infection which may require prolonged hospitalization or re-hospitalization and antibiotic therapy; injury to bowel, bladder, ureters and major vessels or other surrounding organs which may lead to other procedures; formation of adhesions; need for additional procedures including laparotomy or subsequent procedures secondary to intraoperative injury or abnormal pathology; thromboembolic phenomenon; incisional problems and other postoperative or anesthesia complications.  The postoperative expectations were also discussed in detail. The patient also understands the alternative treatment options which were discussed in full. All questions were answered.  Patient would like to proceed with the procedure.  Osteopenia and patient reports loss of height at  today's visit measurement.  Repeat dxa ordered.  Discussed possible osteoporosis. Patient would like to be proactive and has reflux and does not want to start fosamax.  PA placed for evenity and prolia.  Counseled on the medication with r/b/a/I.  15 minutes spent on reviewing records, imaging,  and one on one patient time and counseling  patient and documentation Dr. Tia Flowers   Dr. Tia Flowers

## 2023-05-27 NOTE — H&P (View-Only) (Signed)
 PREOP H&P 68 y.o. y.o.G3P3002 female here for recurrent PM bleeding for preop for RLH/BSO, cystoscopy.  She also reports bothersome enlarged right labia that catches on her underwear and causes discomfort during intercourse and when voiding affects the stream.  She would like to have this labia reduced to be of similar size to the left labia.  Counseled on risks of removing such as poor cosmesis, bleeding, pain, infection, swelling   She voices understanding and would like to proceed with the labial plasty and the time of the RLH/BSO/cystoscopy  Still having the uterine cramping that comes and goes. No further vaginal bleeding. UA neg Denies any GI complaints Vaginal cultures negative  Patient's last menstrual period was 05/12/2013.    Blood pressure 114/70, pulse 70, height 5' 3.75" (1.619 m), weight 135 lb (61.2 kg), last menstrual period 05/12/2013, SpO2 99%.     Component Value Date/Time   DIAGPAP  06/18/2022 1047    - Negative for intraepithelial lesion or malignancy (NILM)   DIAGPAP - Benign reactive/reparative changes 06/18/2022 1047   HPVHIGH Negative 06/18/2022 1047   ADEQPAP  06/18/2022 1047    Satisfactory for evaluation; transformation zone component PRESENT.    GYN HISTORY:    Component Value Date/Time   DIAGPAP  06/18/2022 1047    - Negative for intraepithelial lesion or malignancy (NILM)   DIAGPAP - Benign reactive/reparative changes 06/18/2022 1047   HPVHIGH Negative 06/18/2022 1047   ADEQPAP  06/18/2022 1047    Satisfactory for evaluation; transformation zone component PRESENT.    OB History  Gravida Para Term Preterm AB Living  2 2 2   0 2  SAB IAB Ectopic Multiple Live Births      2    # Outcome Date GA Lbr Len/2nd Weight Sex Type Anes PTL Lv  2 Term           1 Term            1 stillborn breech  Past Medical History:  Diagnosis Date   Allergy    seasonal   COVID    05/2022   Heart murmur    Migraines    Ovarian cyst    right    Restless legs    Varicose veins    Vitamin D  deficiency 12/26/2014    Past Surgical History:  Procedure Laterality Date   APPENDECTOMY     BREAST BIOPSY     BREAST SURGERY Right 2012   biopsy, benign cyst   CARPAL TUNNEL RELEASE Bilateral 2010   COLONOSCOPY  06/13/2016   Dr.Nandigam   FOOT SURGERY Right 2011   bunion removal   POLYPECTOMY      Current Outpatient Medications on File Prior to Visit  Medication Sig Dispense Refill   aspirin 81 MG tablet Take 81 mg by mouth daily.     atorvastatin  (LIPITOR) 20 MG tablet TAKE 1 TABLET(20 MG) BY MOUTH DAILY 90 tablet 1   Biotin 5000 MCG CAPS Take by mouth.     calcium  carbonate (OS-CAL - DOSED IN MG OF ELEMENTAL CALCIUM ) 1250 (500 Ca) MG tablet Take by mouth.     clotrimazole -betamethasone  (LOTRISONE ) cream APPLY EXTERNALLY TO THE AFFECTED AREA TWICE DAILY 45 g 1   metroNIDAZOLE (METROCREAM) 0.75 % cream Apply 1 application topically daily.  11   Rimegepant Sulfate (NURTEC) 75 MG TBDP Take 1 tablet (75 mg total) by mouth daily as needed. 8 tablet 11   rizatriptan  (MAXALT ) 10 MG tablet TAKE BY MOUTH AS DIRECTED  MAY REPEAT IN 2 HOURS IF NEEDED.  MAXIMUM 2 TABLETS IN 24 HOURS 10 tablet 11   EPINEPHrine  (EPIPEN  2-PAK) 0.3 mg/0.3 mL IJ SOAJ injection Inject 0.3 mg into the muscle as needed for anaphylaxis. (Patient not taking: Reported on 05/27/2023) 1 each 1   No current facility-administered medications on file prior to visit.    Social History   Socioeconomic History   Marital status: Married    Spouse name: Not on file   Number of children: Not on file   Years of education: Not on file   Highest education level: Associate degree: academic program  Occupational History   Not on file  Tobacco Use   Smoking status: Never   Smokeless tobacco: Never  Vaping Use   Vaping status: Never Used  Substance and Sexual Activity   Alcohol use: Yes    Comment: once a month per pt-wine   Drug use: No   Sexual activity: Yes    Partners:  Male    Birth control/protection: Post-menopausal    Comment: 1st intercourse 68 yo-Fewer than 5 partners  Other Topics Concern   Not on file  Social History Narrative   Married   2 children (daughter lives here- she has two sons)   Son lives in [place]   Retired from Chief of Staff (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     Left handed   Social Drivers of Health   Financial Resource Strain: Low Risk  (11/18/2022)   Overall Financial Resource Strain (CARDIA)    Difficulty of Paying Living Expenses: Not hard at all  Food Insecurity: No Food Insecurity (11/18/2022)   Hunger Vital Sign    Worried About Running Out of Food in the Last Year: Never true    Ran Out of Food in the Last Year: Never true  Transportation Needs: No Transportation Needs (11/18/2022)   PRAPARE - Administrator, Civil Service (Medical): No    Lack of Transportation (Non-Medical): No  Physical Activity: Sufficiently Active (11/18/2022)   Exercise Vital Sign    Days of Exercise per Week: 5 days    Minutes of Exercise per Session: 30 min  Stress: No Stress Concern Present (11/18/2022)   [employer] of Occupational Health - Occupational Stress Questionnaire    Feeling of Stress : Not at all  Social Connections: Socially Integrated (11/18/2022)   Social Connection and Isolation Panel [NHANES]    Frequency of Communication with Friends and Family: Once a week    Frequency of Social Gatherings with Friends and Family: Twice a week    Attends Religious Services: 1 to 4 times per year    Active Member of [employer] or Organizations: Yes    Attends Banker Meetings: 1 to 4 times per year    Marital Status: Married  Catering manager Violence: Not on file    Family History  Problem Relation Age of Onset   Diabetes Mother    Hypertension Mother 73   Hyperlipidemia Mother    Heart disease Mother        Aortic stenosis   Heart disease Father 67       pacemaker   Atrial  fibrillation Father 24   Peripheral vascular disease Father        s/p Carotid Edartarectomy   Dementia Father    Migraines Daughter      Allergies  Allergen Reactions   Bee Venom     Local swelling  Adhesive [Tape] Rash      Patient's last menstrual period was Patient's last menstrual period was 05/12/2013.Aaron Aas            Review of Systems Alls systems reviewed and are negative.    S1S2 CTA B/L Soft, NT, ND From last exam: Physical Exam Exam conducted with a chaperone present.  Genitourinary:    Vagina: Normal.     Cervix: Normal.     Uterus: Deviated.      Comments: Atrophic Retroverted uterus Normal discharge  No CMT Pain reproduced with uterine palpation     Component Ref Range & Units (hover) 3 mo ago  SURGICAL PATHOLOGY SURGICAL PATHOLOGY CASE: [id number] PATIENT: Lara Plants Surgical Pathology Report     Clinical History: PMB (cm)     FINAL MICROSCOPIC DIAGNOSIS:  A. ENDOMETRIUM, BIOPSY: -  Predominantly mucin with rare fragments of ecto and endocervix, negative for dysplasia. -  No endometrium is present in the biopsy.    GROSS DESCRIPTION:  Received in formalin is blood tinged mucus that is entirely submitted in one block.  Volume: 0.5 x 0.4 x 0.1 cm (1 B)  (KW, 02/11/2023)       A:        recurrent PM bleeding, pelvic pain, stable ovarian mass, osteoporosis, asymmetric labia                             P:         Reinaldo Caras All options discussed with patient.  She does not want to keep going through procedures in the future for PM bleeding and would like a definitive treatment.  She does not want to worry about a future gyn cancer.    Counseled on all options.  She would like to have the [facility].  Counseled extensively on the procedure including but not limited to what to expect and risks and benefits.  Counseled on postop care and pelvic rest for 10 weeks after the surgery with restricted lifting for 6 weeks after.   Counseled on the benefits of the robotic procedure with faster return to daily activities, improved outcomes, and less risk for complications. She would like to have this scheduled.  Discussed removal of both ovaries and tubes as well and she agreed.   2. EMB  No endometrial cells seen  Patient counseled and confident in biopsy as far as being in the uterus and far enough in. Comfortable with proceeding with surgery and not putting patient through separate repeat procedure. Counseled above on labial plasty and r/b/a/I. Patient is miserable with the labial and is willing to take risks with surgery and cosmesis.  Counseled on postcare instructions and placed in wrap up for this.  The risks of surgery were discussed in detail with the patient including but not limited to: bleeding which may require transfusion or reoperation; infection which may require prolonged hospitalization or re-hospitalization and antibiotic therapy; injury to bowel, bladder, ureters and major vessels or other surrounding organs which may lead to other procedures; formation of adhesions; need for additional procedures including laparotomy or subsequent procedures secondary to intraoperative injury or abnormal pathology; thromboembolic phenomenon; incisional problems and other postoperative or anesthesia complications.  The postoperative expectations were also discussed in detail. The patient also understands the alternative treatment options which were discussed in full. All questions were answered.  Patient would like to proceed with the procedure.  Osteopenia and patient reports loss of height at  today's visit measurement.  Repeat dxa ordered.  Discussed possible osteoporosis. Patient would like to be proactive and has reflux and does not want to start fosamax.  PA placed for evenity and prolia.  Counseled on the medication with r/b/a/I.  15 minutes spent on reviewing records, imaging,  and one on one patient time and counseling  patient and documentation Dr. Tia Flowers   Dr. Tia Flowers

## 2023-05-27 NOTE — Telephone Encounter (Signed)
 Prior authorization done for Intrarosa  6.5mg  inserts. I left patient a message letting her know it could take 1-3 days for response from insurance company.  Key: [id number]

## 2023-05-28 NOTE — Telephone Encounter (Signed)
Patient aware PA was approved for medication.

## 2023-06-04 ENCOUNTER — Telehealth: Payer: Self-pay | Admitting: *Deleted

## 2023-06-04 NOTE — Telephone Encounter (Signed)
My Chart message sent. Encounter closed.

## 2023-06-12 ENCOUNTER — Encounter (HOSPITAL_BASED_OUTPATIENT_CLINIC_OR_DEPARTMENT_OTHER): Payer: Self-pay | Admitting: Obstetrics and Gynecology

## 2023-06-12 NOTE — Progress Notes (Signed)
Your procedure is scheduled on :  Wednesday,  06-17-2023  Report to [facility] AT  _6:30__ AM.   Call this number if you have problems the morning of surgery:  [phone] Any questions prior to surgery cal pre-op nurse , Omni Dunsworth :  [phone]   OUR ADDRESS IS [address].  WE ARE LOCATED IN [facility]  PLEASE BRING YOUR INSURANCE CARD AND PHOTO ID DAY OF SURGERY.                                     REMEMBER: Do not eat any food and do not drink any liquid after midnight night before surgery.  This includes no water,  candy,  gum,  and  mints.   Please brush your teeth morning of surgery and rinse mouth out. _____________________________________________________________________     TAKE ONLY THESE MEDICATIONS MORNING OF SURGERY: May take with sips of water If needed may take , Nurtec or Maxalt                                       DO NOT WEAR JEWERLY/  METAL/  PIERCINGS (INCLUDING NO PLASTIC PIERCINGS) DO NOT WEAR LOTIONS, POWDERS, PERFUMES OR NAIL POLISH ON YOUR FINGERNAILS. TOENAIL POLISH IS OK TO WEAR. DO NOT SHAVE FOR 48 HOURS PRIOR TO DAY OF SURGERY.  CONTACTS, GLASSES, OR DENTURES MAY NOT BE WORN TO SURGERY.  REMEMBER: NO SMOKING, VAPING ,  DRUGS OR ALCOHOL FOR 24 HOURS BEFORE YOUR SURGERY.                                    [facility] IS NOT RESPONSIBLE  FOR ANY BELONGINGS.                                                                    Marland Kitchen           [facility] - Preparing for Surgery Before surgery, you can play an important role.  Because skin is not sterile, your skin needs to be as free of germs as possible.  You can reduce the number of germs on your skin by washing with CHG (chlorahexidine gluconate) soap before surgery.  CHG is an antiseptic cleaner which kills germs and bonds with the skin to continue killing germs even after washing. Please DO NOT use if you have an allergy to CHG or antibacterial  soaps.  If your skin becomes reddened/irritated stop using the CHG and inform your nurse when you arrive at Short Stay. Do not shave (including legs and underarms) for at least 48 hours prior to the first CHG shower.  You may shave your face/neck. Please follow these instructions carefully:  1.  Shower with CHG Soap the night before surgery and the  morning of Surgery.  2.  If you choose to wash your hair, wash your hair first as usual with your  normal  shampoo.  3.  After you shampoo, rinse your  hair and body thoroughly to remove the  shampoo.                                        4.  Use CHG as you would any other liquid soap.  You can apply chg directly  to the skin and wash , chg soap provided, night before and morning of your surgery.  5.  Apply the CHG Soap to your body ONLY FROM THE NECK DOWN.   Do not use on face/ open                           Wound or open sores. Avoid contact with eyes, ears mouth and genitals (private parts).                       Wash face,  Genitals (private parts) with your normal soap.             6.  Wash thoroughly, paying special attention to the area where your surgery  will be performed.  7.  Thoroughly rinse your body with warm water from the neck down.  8.  DO NOT shower/wash with your normal soap after using and rinsing off  the CHG Soap.             9.  Pat yourself dry with a clean towel.            10.  Wear clean pajamas.            11.  Place clean sheets on your bed the night of your first shower and do not  sleep with pets. Day of Surgery : Do not apply any lotions/ powders the morning of surgery.  Please wear clean clothes to the hospital/surgery center.  IF YOU HAVE ANY SKIN IRRITATION OR PROBLEMS WITH THE SURGICAL SOAP, PLEASE GET A BAR OF GOLD DIAL SOAP AND SHOWER THE NIGHT BEFORE YOUR SURGERY AND THE MORNING OF YOUR SURGERY. PLEASE LET THE NURSE KNOW MORNING OF YOUR SURGERY IF YOU HAD ANY PROBLEMS WITH THE SURGICAL SOAP.   YOUR SURGEON MAY  HAVE REQUESTED EXTENDED RECOVERY TIME AFTER YOUR SURGERY. IT COULD BE A  JUST A FEW HOURS  UP TO AN OVERNIGHT STAY.  YOUR SURGEON SHOULD HAVE DISCUSSED THIS WITH YOU PRIOR TO YOUR SURGERY. IN THE EVENT YOU NEED TO STAY OVERNIGHT PLEASE REFER TO THE FOLLOWING GUIDELINES. YOU MAY HAVE UP TO 4 VISITORS  MAY VISIT IN THE EXTENDED RECOVERY ROOM UNTIL 800 PM ONLY.  ONE  VISITOR AGE 95 AND OVER MAY SPEND THE NIGHT AND MUST BE IN EXTENDED RECOVERY ROOM NO LATER THAN 800 PM . YOUR DISCHARGE TIME AFTER YOU SPEND THE NIGHT IS 900 AM THE MORNING AFTER YOUR SURGERY. YOU MAY PACK A SMALL OVERNIGHT BAG WITH TOILETRIES FOR YOUR OVERNIGHT STAY IF YOU WISH.  REGARDLESS OF IF YOU STAY OVER NIGHT OR ARE DISCHARGED THE SAME DAY YOU WILL BE REQUIRED TO HAVE A RESPONSIBLE ADULT (18 YRS OLD OR OLDER) STAY WITH YOU FOR AT LEAST THE FIRST 24 HOURS WHEN HOME  YOUR PRESCRIPTION MEDICATIONS WILL BE PROVIDED DURING [facility] STAY.  ________________________________________________________________________

## 2023-06-12 NOTE — Progress Notes (Signed)
Spoke w/ via phone for pre-op interview--- pt Lab needs dos----  no       Lab results------ lab apt 06-15-2023 @ 0930 getting CBC/ T&S COVID test -----patient states asymptomatic no test needed Arrive at ------- 0630 on 06-17-2023 NPO after MN w/ exception water w/ meds Med rec completed Medications to take morning of surgery ----- if needed may take nurtec or maxalt Diabetic medication ----- n/a Patient instructed no nail polish to be worn day of surgery Patient instructed to bring photo id and insurance card day of surgery Patient aware to have Driver (ride ) / caregiver    for 24 hours after surgery - husband, Joanna Lambert Patient Special Instructions ----- will pick bag w/ hibiclens and written instructions at lab appt. Asked to call w/ any questions Pre-Op special Instructions ----- sent inbox message in epic to dr boswell, requested pre-op orders Patient verbalized understanding of instructions that were given at this phone interview. Patient denies chest pain, sob, fever, cough at the interview.

## 2023-06-15 ENCOUNTER — Encounter (HOSPITAL_COMMUNITY)
Admission: RE | Admit: 2023-06-15 | Discharge: 2023-06-15 | Disposition: A | Discharge status: Home / Self Care | Payer: Medicare Other | Source: Ambulatory Visit | Attending: Obstetrics and Gynecology | Admitting: Obstetrics and Gynecology

## 2023-06-15 DIAGNOSIS — Z01812 Encounter for preprocedural laboratory examination: Secondary | ICD-10-CM | POA: Insufficient documentation

## 2023-06-15 DIAGNOSIS — Z01818 Encounter for other preprocedural examination: Secondary | ICD-10-CM

## 2023-06-15 DIAGNOSIS — N920 Excessive and frequent menstruation with regular cycle: Secondary | ICD-10-CM | POA: Diagnosis not present

## 2023-06-15 LAB — CBC
HCT: 42 % (ref 36.0–46.0)
Hemoglobin: 13.8 g/dL (ref 12.0–15.0)
MCH: 30.9 pg (ref 26.0–34.0)
MCHC: 32.9 g/dL (ref 30.0–36.0)
MCV: 94 fL (ref 80.0–100.0)
Platelets: 246 10*3/uL (ref 150–400)
RBC: 4.47 MIL/uL (ref 3.87–5.11)
RDW: 11.6 % (ref 11.5–15.5)
WBC: 6.7 10*3/uL (ref 4.0–10.5)
nRBC: 0 % (ref 0.0–0.2)

## 2023-06-16 NOTE — Anesthesia Preprocedure Evaluation (Addendum)
 Anesthesia Evaluation  Patient identified by MRN, date of birth, ID band Patient awake    Reviewed: Allergy & Precautions, NPO status , Patient's Chart, lab work & pertinent test results  History of Anesthesia Complications Negative for: history of anesthetic complications  Airway Mallampati: III  TM Distance: >3 FB Neck ROM: Full    Dental  (+) Dental Advisory Given, Teeth Intact   Pulmonary neg pulmonary ROS   Pulmonary exam normal        Cardiovascular negative cardio ROS Normal cardiovascular exam     Neuro/Psych  Headaches  Neuromuscular disease  negative psych ROS   GI/Hepatic Neg liver ROS,GERD  Controlled,,  Endo/Other  negative endocrine ROS    Renal/GU negative Renal ROS    Chronic pelvic pain     Musculoskeletal negative musculoskeletal ROS (+)    Abdominal   Peds  Hematology negative hematology ROS (+)   Anesthesia Other Findings   Reproductive/Obstetrics                             Anesthesia Physical Anesthesia Plan  ASA: 2  Anesthesia Plan: General   Post-op Pain Management: Tylenol  PO (pre-op)* and Celebrex  PO (pre-op)*   Induction: Intravenous  PONV Risk Score and Plan: 3 and Treatment may vary due to age or medical condition, Ondansetron , Dexamethasone  and Midazolam   Airway Management Planned: Oral ETT  Additional Equipment: None  Intra-op Plan:   Post-operative Plan: Extubation in OR  Informed Consent: I have reviewed the patients History and Physical, chart, labs and discussed the procedure including the risks, benefits and alternatives for the proposed anesthesia with the patient or authorized representative who has indicated his/her understanding and acceptance.     Dental advisory given  Plan Discussed with: CRNA and Anesthesiologist  Anesthesia Plan Comments:        Anesthesia Quick Evaluation

## 2023-06-17 ENCOUNTER — Ambulatory Visit (HOSPITAL_BASED_OUTPATIENT_CLINIC_OR_DEPARTMENT_OTHER): Payer: Self-pay | Admitting: Anesthesiology

## 2023-06-17 ENCOUNTER — Other Ambulatory Visit: Payer: Self-pay

## 2023-06-17 ENCOUNTER — Encounter (HOSPITAL_BASED_OUTPATIENT_CLINIC_OR_DEPARTMENT_OTHER)
Admission: RE | Disposition: A | Discharge status: Home / Self Care | Payer: Self-pay | Source: Home / Self Care | Attending: Obstetrics and Gynecology

## 2023-06-17 ENCOUNTER — Encounter (HOSPITAL_BASED_OUTPATIENT_CLINIC_OR_DEPARTMENT_OTHER): Payer: Self-pay | Admitting: Obstetrics and Gynecology

## 2023-06-17 ENCOUNTER — Ambulatory Visit (HOSPITAL_BASED_OUTPATIENT_CLINIC_OR_DEPARTMENT_OTHER)
Admission: RE | Admit: 2023-06-17 | Discharge: 2023-06-17 | Disposition: A | Discharge status: Home / Self Care | Payer: Medicare Other | Attending: Obstetrics and Gynecology | Admitting: Obstetrics and Gynecology

## 2023-06-17 DIAGNOSIS — N950 Postmenopausal bleeding: Secondary | ICD-10-CM | POA: Insufficient documentation

## 2023-06-17 DIAGNOSIS — R102 Pelvic and perineal pain: Secondary | ICD-10-CM | POA: Insufficient documentation

## 2023-06-17 DIAGNOSIS — N906 Unspecified hypertrophy of vulva: Secondary | ICD-10-CM | POA: Diagnosis not present

## 2023-06-17 DIAGNOSIS — N83209 Unspecified ovarian cyst, unspecified side: Secondary | ICD-10-CM | POA: Diagnosis not present

## 2023-06-17 DIAGNOSIS — E785 Hyperlipidemia, unspecified: Secondary | ICD-10-CM | POA: Diagnosis not present

## 2023-06-17 DIAGNOSIS — N888 Other specified noninflammatory disorders of cervix uteri: Secondary | ICD-10-CM | POA: Diagnosis not present

## 2023-06-17 DIAGNOSIS — K219 Gastro-esophageal reflux disease without esophagitis: Secondary | ICD-10-CM | POA: Diagnosis not present

## 2023-06-17 DIAGNOSIS — M8580 Other specified disorders of bone density and structure, unspecified site: Secondary | ICD-10-CM | POA: Insufficient documentation

## 2023-06-17 DIAGNOSIS — R29890 Loss of height: Secondary | ICD-10-CM | POA: Insufficient documentation

## 2023-06-17 DIAGNOSIS — Z01818 Encounter for other preprocedural examination: Secondary | ICD-10-CM

## 2023-06-17 HISTORY — DX: Other chronic pain: G89.29

## 2023-06-17 HISTORY — PX: CYSTOSCOPY: SHX5120

## 2023-06-17 HISTORY — DX: Other specified disorders of bone density and structure, unspecified site: M85.80

## 2023-06-17 HISTORY — DX: Presence of spectacles and contact lenses: Z97.3

## 2023-06-17 HISTORY — PX: ROBOTIC ASSISTED TOTAL HYSTERECTOMY WITH BILATERAL SALPINGO OOPHERECTOMY: SHX6086

## 2023-06-17 HISTORY — DX: Other seasonal allergic rhinitis: J30.2

## 2023-06-17 HISTORY — DX: Hyperlipidemia, unspecified: E78.5

## 2023-06-17 HISTORY — DX: Personal history of adenomatous and serrated colon polyps: Z86.0101

## 2023-06-17 HISTORY — PX: LABIOPLASTY: SHX1900

## 2023-06-17 HISTORY — DX: Postmenopausal bleeding: N95.0

## 2023-06-17 HISTORY — PX: ROBOTIC ASSISTED LAPAROSCOPIC LYSIS OF ADHESION: SHX6080

## 2023-06-17 HISTORY — DX: Restless legs syndrome: G25.81

## 2023-06-17 HISTORY — DX: Gastro-esophageal reflux disease without esophagitis: K21.9

## 2023-06-17 HISTORY — DX: Other noninflammatory disorders of ovary, fallopian tube and broad ligament: N83.8

## 2023-06-17 LAB — TYPE AND SCREEN
ABO/RH(D): O POS
Antibody Screen: NEGATIVE

## 2023-06-17 LAB — ABO/RH: ABO/RH(D): O POS

## 2023-06-17 SURGERY — HYSTERECTOMY, TOTAL, ROBOT-ASSISTED, LAPAROSCOPIC, WITH BILATERAL SALPINGO-OOPHORECTOMY
Anesthesia: General | Site: Urethra

## 2023-06-17 MED ORDER — ROCURONIUM BROMIDE 10 MG/ML (PF) SYRINGE
PREFILLED_SYRINGE | INTRAVENOUS | Status: AC
  Filled 2023-06-17: qty 10

## 2023-06-17 MED ORDER — LIDOCAINE HCL (PF) 2 % IJ SOLN
INTRAMUSCULAR | Status: AC
Start: 2023-06-17 — End: ?
  Filled 2023-06-17: qty 5

## 2023-06-17 MED ORDER — STERILE WATER FOR IRRIGATION IR SOLN
Status: DC | PRN
  Administered 2023-06-17: 500 mL

## 2023-06-17 MED ORDER — SODIUM CHLORIDE 0.9 % IV SOLN
Freq: Once | INTRAVENOUS | Status: DC
  Filled 2023-06-17 (×4): qty 10

## 2023-06-17 MED ORDER — ROCURONIUM BROMIDE 10 MG/ML (PF) SYRINGE
PREFILLED_SYRINGE | INTRAVENOUS | Status: DC | PRN
  Administered 2023-06-17: 50 mg via INTRAVENOUS

## 2023-06-17 MED ORDER — DEXAMETHASONE SODIUM PHOSPHATE 10 MG/ML IJ SOLN
INTRAMUSCULAR | Status: AC
Start: 2023-06-17 — End: ?
  Filled 2023-06-17: qty 1

## 2023-06-17 MED ORDER — BUPIVACAINE HCL (PF) 0.5 % IJ SOLN
INTRAMUSCULAR | Status: DC | PRN
  Administered 2023-06-17: 23 mL

## 2023-06-17 MED ORDER — LIDOCAINE 2% (20 MG/ML) 5 ML SYRINGE
INTRAMUSCULAR | Status: DC | PRN
  Administered 2023-06-17: 60 mg via INTRAVENOUS

## 2023-06-17 MED ORDER — BUPIVACAINE LIPOSOME 1.3 % IJ SUSP
INTRAMUSCULAR | Status: DC | PRN
  Administered 2023-06-17: 20 mL

## 2023-06-17 MED ORDER — CLINDAMYCIN PHOSPHATE 900 MG/50ML IV SOLN
INTRAVENOUS | Status: AC
  Filled 2023-06-17: qty 50

## 2023-06-17 MED ORDER — SODIUM CHLORIDE 0.9 % IV SOLN
INTRAVENOUS | Status: DC | PRN
  Administered 2023-06-17: 700 mL

## 2023-06-17 MED ORDER — ACETAMINOPHEN 500 MG PO TABS
1000.0000 mg | ORAL_TABLET | Freq: Once | ORAL | Status: AC
  Administered 2023-06-17: 1000 mg via ORAL

## 2023-06-17 MED ORDER — DEXMEDETOMIDINE HCL IN NACL 80 MCG/20ML IV SOLN
INTRAVENOUS | Status: DC | PRN
  Administered 2023-06-17: 4 ug via INTRAVENOUS
  Administered 2023-06-17 (×2): 8 ug via INTRAVENOUS

## 2023-06-17 MED ORDER — EPHEDRINE SULFATE (PRESSORS) 50 MG/ML IJ SOLN
INTRAMUSCULAR | Status: DC | PRN
  Administered 2023-06-17: 10 mg via INTRAVENOUS

## 2023-06-17 MED ORDER — DEXAMETHASONE SODIUM PHOSPHATE 10 MG/ML IJ SOLN
INTRAMUSCULAR | Status: DC | PRN
  Administered 2023-06-17: 10 mg via INTRAVENOUS

## 2023-06-17 MED ORDER — SUGAMMADEX SODIUM 200 MG/2ML IV SOLN
INTRAVENOUS | Status: DC | PRN
  Administered 2023-06-17: 120 mg via INTRAVENOUS

## 2023-06-17 MED ORDER — SODIUM CHLORIDE 0.9 % IV SOLN: INTRAVENOUS | Status: DC

## 2023-06-17 MED ORDER — GABAPENTIN 300 MG PO CAPS
ORAL_CAPSULE | ORAL | Status: AC
  Filled 2023-06-17: qty 1

## 2023-06-17 MED ORDER — FENTANYL CITRATE (PF) 250 MCG/5ML IJ SOLN
INTRAMUSCULAR | Status: AC
  Filled 2023-06-17: qty 5

## 2023-06-17 MED ORDER — ONDANSETRON HCL 4 MG/2ML IJ SOLN
INTRAMUSCULAR | Status: AC
Start: 2023-06-17 — End: ?
  Filled 2023-06-17: qty 2

## 2023-06-17 MED ORDER — OXYCODONE HCL 5 MG PO TABS
5.0000 mg | ORAL_TABLET | Freq: Once | ORAL | Status: DC | PRN
Start: 2023-06-17 — End: 2023-06-17

## 2023-06-17 MED ORDER — PROPOFOL 500 MG/50ML IV EMUL
INTRAVENOUS | Status: AC
  Filled 2023-06-17: qty 50

## 2023-06-17 MED ORDER — SODIUM CHLORIDE 0.9 % IV SOLN
Freq: Once | INTRAVENOUS | Status: DC
  Filled 2023-06-17: qty 10

## 2023-06-17 MED ORDER — CLINDAMYCIN PHOSPHATE 900 MG/50ML IV SOLN
900.0000 mg | INTRAVENOUS | Status: AC
  Administered 2023-06-17: 900 mg via INTRAVENOUS

## 2023-06-17 MED ORDER — CELECOXIB 200 MG PO CAPS
200.0000 mg | ORAL_CAPSULE | Freq: Once | ORAL | Status: AC
  Administered 2023-06-17: 200 mg via ORAL

## 2023-06-17 MED ORDER — FENTANYL CITRATE (PF) 100 MCG/2ML IJ SOLN
INTRAMUSCULAR | Status: DC | PRN
  Administered 2023-06-17 (×2): 50 ug via INTRAVENOUS

## 2023-06-17 MED ORDER — GENTAMICIN SULFATE 40 MG/ML IJ SOLN
5.0000 mg/kg | INTRAVENOUS | Status: AC
  Administered 2023-06-17: 306 mg via INTRAVENOUS
  Filled 2023-06-17: qty 7.75

## 2023-06-17 MED ORDER — MIDAZOLAM HCL 5 MG/5ML IJ SOLN
INTRAMUSCULAR | Status: DC | PRN
  Administered 2023-06-17: 2 mg via INTRAVENOUS

## 2023-06-17 MED ORDER — MIDAZOLAM HCL 2 MG/2ML IJ SOLN
INTRAMUSCULAR | Status: AC
Start: 2023-06-17 — End: ?
  Filled 2023-06-17: qty 2

## 2023-06-17 MED ORDER — SODIUM CHLORIDE 0.9 % IV SOLN: Freq: Once | INTRAVENOUS | Status: DC

## 2023-06-17 MED ORDER — WHITE PETROLATUM EX OINT
TOPICAL_OINTMENT | CUTANEOUS | Status: AC
Start: 2023-06-17 — End: ?
  Filled 2023-06-17: qty 5

## 2023-06-17 MED ORDER — POVIDONE-IODINE 10 % EX SWAB
2.0000 | Freq: Once | CUTANEOUS | Status: DC
Start: 2023-06-17 — End: 2023-06-17

## 2023-06-17 MED ORDER — ONDANSETRON HCL 4 MG/2ML IJ SOLN: 4.0000 mg | Freq: Once | INTRAMUSCULAR | Status: DC | PRN

## 2023-06-17 MED ORDER — ACETAMINOPHEN 500 MG PO TABS
ORAL_TABLET | ORAL | Status: AC
Start: 2023-06-17 — End: ?
  Filled 2023-06-17: qty 2

## 2023-06-17 MED ORDER — EPHEDRINE 5 MG/ML INJ
INTRAVENOUS | Status: AC
Start: 2023-06-17 — End: ?
  Filled 2023-06-17: qty 5

## 2023-06-17 MED ORDER — DEXMEDETOMIDINE HCL IN NACL 80 MCG/20ML IV SOLN
INTRAVENOUS | Status: AC
  Filled 2023-06-17: qty 20

## 2023-06-17 MED ORDER — LACTATED RINGERS IV SOLN: INTRAVENOUS | Status: DC

## 2023-06-17 MED ORDER — FENTANYL CITRATE (PF) 100 MCG/2ML IJ SOLN: 25.0000 ug | INTRAMUSCULAR | Status: DC | PRN

## 2023-06-17 MED ORDER — LACTATED RINGERS IV SOLN: INTRAVENOUS | Status: AC

## 2023-06-17 MED ORDER — PROPOFOL 10 MG/ML IV BOLUS
INTRAVENOUS | Status: DC | PRN
  Administered 2023-06-17: 120 mg via INTRAVENOUS

## 2023-06-17 MED ORDER — CELECOXIB 200 MG PO CAPS
ORAL_CAPSULE | ORAL | Status: AC
Start: 2023-06-17 — End: ?
  Filled 2023-06-17: qty 1

## 2023-06-17 MED ORDER — GABAPENTIN 300 MG PO CAPS
300.0000 mg | ORAL_CAPSULE | ORAL | Status: AC
  Administered 2023-06-17: 300 mg via ORAL

## 2023-06-17 MED ORDER — OXYCODONE HCL 5 MG/5ML PO SOLN: 5.0000 mg | Freq: Once | ORAL | Status: DC | PRN

## 2023-06-17 MED ORDER — ACETAMINOPHEN 500 MG PO TABS: 1000.0000 mg | ORAL_TABLET | ORAL | Status: DC

## 2023-06-17 MED ORDER — ONDANSETRON HCL 4 MG/2ML IJ SOLN
INTRAMUSCULAR | Status: DC | PRN
  Administered 2023-06-17: 4 mg via INTRAVENOUS

## 2023-06-17 SURGICAL SUPPLY — 56 items
BLADE SURG 10 STRL SS (BLADE) IMPLANT
CATH FOLEY 3WAY 5CC 16FR (CATHETERS) ×4 IMPLANT
COVER BACK TABLE 60X90IN (DRAPES) ×4 IMPLANT
COVER TIP SHEARS 8 DVNC (MISCELLANEOUS) ×4 IMPLANT
DEFOGGER SCOPE WARMER CLEARIFY (MISCELLANEOUS) ×4 IMPLANT
DERMABOND ADVANCED .7 DNX12 (GAUZE/BANDAGES/DRESSINGS) ×4 IMPLANT
DRAPE ARM DVNC X/XI (DISPOSABLE) ×16 IMPLANT
DRAPE COLUMN DVNC XI (DISPOSABLE) ×4 IMPLANT
DRAPE UTILITY XL STRL (DRAPES) ×4 IMPLANT
DRIVER NDL MEGA SUTCUT DVNCXI (INSTRUMENTS) IMPLANT
DRIVER NDLE MEGA SUTCUT DVNCXI (INSTRUMENTS) ×4 IMPLANT
DURAPREP 26ML APPLICATOR (WOUND CARE) ×4 IMPLANT
ELECT REM PT RETURN 9FT ADLT (ELECTROSURGICAL) ×4 IMPLANT
ELECTRODE REM PT RTRN 9FT ADLT (ELECTROSURGICAL) ×4 IMPLANT
FORCEPS PROGRASP DVNC XI (FORCEP) IMPLANT
GAUZE 4X4 16PLY ~~LOC~~+RFID DBL (SPONGE) IMPLANT
GLOVE BIO SURGEON STRL SZ7 (GLOVE) IMPLANT
GLOVE BIOGEL PI IND STRL 6 (GLOVE) IMPLANT
GLOVE BIOGEL PI IND STRL 7.0 (GLOVE) IMPLANT
GLOVE NEODERM STER SZ 7 (GLOVE) ×12 IMPLANT
GLOVE SURG SS PI 7.5 STRL IVOR (GLOVE) IMPLANT
HIBICLENS CHG 4% 4OZ BTL (MISCELLANEOUS) ×8 IMPLANT
IRRIG SUCT STRYKERFLOW 2 WTIP (MISCELLANEOUS) ×4 IMPLANT
IRRIGATION SUCT STRKRFLW 2 WTP (MISCELLANEOUS) ×4 IMPLANT
KIT PINK PAD W/HEAD ARE REST (MISCELLANEOUS) ×4 IMPLANT
KIT PINK PAD W/HEAD ARM REST (MISCELLANEOUS) ×4 IMPLANT
KIT TURNOVER CYSTO (KITS) ×4 IMPLANT
LEGGING LITHOTOMY PAIR STRL (DRAPES) ×4 IMPLANT
MANIFOLD NEPTUNE II (INSTRUMENTS) ×4 IMPLANT
NDL HYPO 22X1.5 SAFETY MO (MISCELLANEOUS) IMPLANT
NEEDLE HYPO 22X1.5 SAFETY MO (MISCELLANEOUS) ×4 IMPLANT
OBTURATOR OPTICAL STND 8 DVNC (TROCAR) ×4 IMPLANT
OBTURATOR OPTICALSTD 8 DVNC (TROCAR) ×4 IMPLANT
PACK PERINEAL COLD (PAD) IMPLANT
PACK ROBOT WH (CUSTOM PROCEDURE TRAY) ×4 IMPLANT
PACK ROBOTIC GOWN (GOWN DISPOSABLE) ×4 IMPLANT
PAD OB MATERNITY 4.3X12.25 (PERSONAL CARE ITEMS) ×4 IMPLANT
RUMI II 3.0CM BLUE KOH-EFFICIE (DISPOSABLE) IMPLANT
SCISSORS MNPLR CVD DVNC XI (INSTRUMENTS) IMPLANT
SEAL UNIV 5-12 XI (MISCELLANEOUS) ×12 IMPLANT
SEALER VESSEL EXT DVNC XI (MISCELLANEOUS) IMPLANT
SET IRRIG Y TYPE TUR BLADDER L (SET/KITS/TRAYS/PACK) ×4 IMPLANT
SET TUBE SMOKE EVAC HIGH FLOW (TUBING) ×4 IMPLANT
SOL PREP POV-IOD 4OZ 10% (MISCELLANEOUS) ×4 IMPLANT
SPIKE FLUID TRANSFER (MISCELLANEOUS) ×4 IMPLANT
SUT MNCRL AB 4-0 PS2 18 (SUTURE) ×4 IMPLANT
SUT VIC AB 3-0 SH 27X BRD (SUTURE) IMPLANT
SUT VIC AB 3-0 SH 27XBRD (SUTURE) IMPLANT
SUT VIC AB 4-0 PS2 18 (SUTURE) IMPLANT
SUT VIC AB 4-0 PS2 27 (SUTURE) IMPLANT
SUT VLOC 180 0 6IN GS21 (SUTURE) IMPLANT
SUT VLOC 180 0 9IN GS21 (SUTURE) ×4 IMPLANT
TIP UTERINE 6.7X8CM BLUE DISP (MISCELLANEOUS) IMPLANT
TOWEL OR 17X24 6PK STRL BLUE (TOWEL DISPOSABLE) ×4 IMPLANT
UNDERPAD 30X36 HEAVY ABSORB (UNDERPADS AND DIAPERS) ×4 IMPLANT
WATER STERILE IRR 500ML POUR (IV SOLUTION) ×4 IMPLANT

## 2023-06-17 NOTE — Discharge Instructions (Signed)
 Do NOT take Tylenol  until after 1:15 pm today.   Post Anesthesia Home Care Instructions  Activity: Get plenty of rest for the remainder of the day. A responsible adult should stay with you for 24 hours following the procedure.  For the next 24 hours, DO NOT: -Drive a car -Advertising copywriter -Drink alcoholic beverages -Take any medication unless instructed by your physician -Make any legal decisions or sign important papers.  Meals: Start with liquid foods such as gelatin or soup. Progress to regular foods as tolerated. Avoid greasy, spicy, heavy foods. If nausea and/or vomiting occur, drink only clear liquids until the nausea and/or vomiting subsides. Call your physician if vomiting continues.  Special Instructions/Symptoms: Your throat may feel dry or sore from the anesthesia or the breathing tube placed in your throat during surgery. If this causes discomfort, gargle with warm salt water . The discomfort should disappear within 24 hours.  If you had a scopolamine patch placed behind your ear for the management of post- operative nausea and/or vomiting:  1. The medication in the patch is effective for 72 hours, after which it should be removed.  Wrap patch in a tissue and discard in the trash. Wash hands thoroughly with soap and water . 2. You may remove the patch earlier than 72 hours if you experience unpleasant side effects which may include dry mouth, dizziness or visual disturbances. 3. Avoid touching the patch. Wash your hands with soap and water  after contact with the patch.  Information for Discharge Teaching: EXPAREL  (bupivacaine  liposome injectable suspension)   Your surgeon gave you EXPAREL (bupivacaine ) in your surgical incision to help control your pain after surgery.  EXPAREL  is a local anesthetic that provides pain relief  by numbing the tissue around the surgical site. EXPAREL  is designed to release pain medication over time and can control pain for up to 72 hours. Depending  on how you respond to EXPAREL , you may require less pain medication during your recovery.  Possible side effects: Temporary loss of sensation or ability to move in the area where bupivacaine  was injected. Nausea, vomiting, constipation Rarely, numbness and tingling in your mouth or lips, lightheadedness, or anxiety may occur. Call your doctor right away if you think you may be experiencing any of these sensations, or if you have other questions regarding possible side effects.  Follow all other discharge instructions given to you by your surgeon or nurse. Eat a healthy diet and drink plenty of water  or other fluids.  If you return to the hospital for any reason within 96 hours following the administration of EXPAREL , please inform your health care providers.

## 2023-06-17 NOTE — Anesthesia Procedure Notes (Signed)
 Procedure Name: Intubation Date/Time: 06/17/2023 8:42 AM  Performed by: Alejo Delon DASEN, CRNAPre-anesthesia Checklist: Patient identified, Emergency Drugs available, Suction available and Patient being monitored Patient Re-evaluated:Patient Re-evaluated prior to induction Oxygen Delivery Method: Circle system utilized Preoxygenation: Pre-oxygenation with 100% oxygen Induction Type: IV induction Ventilation: Mask ventilation without difficulty Laryngoscope Size: Mac and 3 Grade View: Grade II Tube type: Oral Tube size: 7.0 mm Number of attempts: 2 Airway Equipment and Method: Stylet Placement Confirmation: ETT inserted through vocal cords under direct vision, positive ETCO2 and breath sounds checked- equal and bilateral Secured at: 19 cm Tube secured with: Tape Dental Injury: Teeth and Oropharynx as per pre-operative assessment

## 2023-06-17 NOTE — Op Note (Signed)
 06/17/2023   [id number]  Joanna Lambert         OPERATIVE REPORT   Preop Diagnosis: recurrent postmenopausal bleeding, pelvic pain, enlarged right labia  Procedure: robotic hysterectomy, BSO, lysis of adhesions, bilateral labial plasty, cystoscopy   Surgeon: Dr. Almarie Rollo Carpen Assistant: Judyann Rattler, RN   Fluids: please see anesthesia report   Complications: None Anesthesia: General     Findings:  boggy 8cm uterus, normal ovaries and tubes Cystoscopy at the end of the case with normal bladder and patent ureters bilaterally. Redundant right labia Permission given from husband intra-op for bilateral labial plasty to make symmetric.  adhesions from bowel to right anterior abdominal wall with possible area for bowel entrapment that was released with close care of bowel during the process  Estimated blood loss: Minimal <25cc   Specimens: Uterus, cervix and bilateral tubes and ovaries   Disposition of specimen: Pathology      exparel  30cc used for postoperative labial plasty pain recovery placed     Patient is taken to the operating room. She is placed in the supine position. She is a running IV in place. Informed consent was present on the chart. SCDs on her lower extremities and functioning properly. Patient was positioned while she was awake.  Her legs were placed in the low lithotomy position in [place] stirrups. Her arms were tucked by the side.  General endotracheal anesthesia was administered by the anesthesia staff without difficulty.       Dura prep was then used to prep the abdomen and Hibiclens was used to prep the inner thighs, perineum and vagina. Once 3 minutes had past the patient was draped in a normal standard fashion. A proper time out was performed and everyone agreed.  The legs were lifted to the high lithotomy position. A bivalve speculum was inserted into the vagina and the anterior lip of the cervix was grasped with single-tooth tenaculum.  The  uterus sounded to  8 cm. Pratt dilators were used to dilate the cervix.  The RUMI uterine manipulator was obtained inserted into the endometrial cavity and the bulb of the disposable tip was inflated with 8 cc of normal saline. There was a good fit of the KOH ring around the cervix. The tenaculum and bivavle speculum was removed. There is also good manipulation of the uterus.  A Foley catheter was placed to straight drain.  Clear urine was noted. Legs were lowered to the low lithotomy position and attention was turned the abdomen.   Superior to the umbilicus, marcaine  0.25% used to anesthetize the skin.  Using #11 blade, 8mm skin incision was made.  The 8mm robotic trocar and sleeve was inserted under direct visualization.  CO2 gas was  started and patient was placed in trendelenburg position.  Two additional 8mm ports were placed under direct visualization in the left and right lower quadrant.     Ureters were identifies.  Attention was turned to the left side.  The left IP ligament was noted away from the ureters and this was desiccated and transected with the vessel sealer.  The uterine ovarian pedicle was serially clamped cauterized and incised. Left round ligament was serially clamped cauterized and incised. The anterior and posterior peritoneum of the inferior leaf of the broad ligament were opened. The beginning of the bladder flap was created.  The bladder was taken down below the level of the KOH ring. The left uterine artery skeletonized and then just superior to the KOH ring this vessel  was serially clamped, cauterized, and incised.   Attention was turned the right side.  An clear area of adhesion noted lifting the bowel to the anterior abdominal wall and possible potential area for bowel entrapment. This was taken down with close care of the bowel and did not involve it.  The uterus was placed on stretch to the opposite side.    The right IP ligament was away from the ureter and this was  desiccated and transected with the vessel sealer.  Then the right uterine ovarian pedicle was serially clamped cauterized and incised. Next the right round ligament was serially clamped cauterized and incised. The anterior posterior peritoneum of the inferiorly for the broad ligament were opened. The anterior peritoneum was carried across to the dissection on the left side. The remainder of the bladder flap was created using sharp dissection. The bladder was well below the level of the KOH ring. The right uterine artery skeletonized. Then the right uterine artery, above the level of the KOH ring, was serially clamped cauterized and incised. The uterus was devascularized at this point.   The colpotomy was performed.  This was carried around a circumferential fashion until the vaginal mucosa was completely incised in the specimen was freed.  The specimen was then delivered to the vagina.  A vaginal occlusive device was used to maintain the pneumoperitoneum   Instruments were changed with a needle driver and prograsp.  Using a 9 inch  zero V-lock suture, the cuff was closed by incorporating the anterior and posterior vaginal mucosa in each stitch. This was carried across all the way to the left corner and a running fashion. Two stitches were brought back towards the midline and the suture was cut flush with the vagina. The needle was brought out the pelvis. The pelvis was irrigated. All pedicles were inspected. No bleeding was noted.   CO2 pressures were lowered to 8mm Hg.  Again, no bleeding was noted.  Ureters were noted deep in the pelvis to be peristalsing.  At this point the procedure was completed.  The remaining instruments were removed.  The ports were removed under direct visualization of the laparoscope and the pneumoperitoneum was relieved.   The skin was then closed with subcuticular stitches of 3-0 Vicryl. The skin was cleansed Dermabond was applied. Attention was then turned the vagina and the cuff  was inspected. No bleeding was noted.  The Foley catheter was removed.  Cystoscopy was performed.  No sutures or bladder injuries were noted.  Ureters were noted with normal urine jets from each one was seen.  Foley was left out after the cystoscopic fluid was drained and cystoscope removed.    Attention was turned to the [employer] he right side.  This was redundant and larger than the left side. A wedge resection was made and interrupted sutures with 3.0 vicryl and 4.0cryl was used.  Permission was given from the husband to do the left side to avoid another surgery and this would allow it to appear symmetric on both sides.  A similar process was performed. Experal was injected for post operative pain control.  Sponge, lap, needle, instrument counts were correct x2. Patient tolerated the procedure very well. She was awakened from anesthesia, extubated and taken to recovery in stable condition.      Dr. Glennon

## 2023-06-17 NOTE — Transfer of Care (Signed)
 Immediate Anesthesia Transfer of Care Note  Patient: Joanna Lambert  Procedure(s) Performed: XI ROBOTIC ASSISTED TOTAL HYSTERECTOMY WITH BILATERAL SALPINGO OOPHORECTOMY (Bilateral: Pelvis) CYSTOSCOPY (Urethra) LABIAPLASTY (Bilateral) XI ROBOTIC ASSISTED LAPAROSCOPIC LYSIS OF ADHESION (Pelvis)  Patient Location: PACU  Anesthesia Type:General  Level of Consciousness: drowsy  Airway & Oxygen Therapy: Patient Spontanous Breathing and Patient connected to nasal cannula oxygen  Post-op Assessment: Report given to RN  Post vital signs: Reviewed and stable  Last Vitals:  Vitals Value Taken Time  BP 127/69 06/17/23 1055  Temp    Pulse 88 06/17/23 1057  Resp 14 06/17/23 1057  SpO2 97 % 06/17/23 1057  Vitals shown include unfiled device data.  Last Pain:  Vitals:   06/17/23 9287  TempSrc: Oral  PainSc: 0-No pain      Patients Stated Pain Goal: 5 (06/17/23 9287)  Complications: No notable events documented.

## 2023-06-17 NOTE — Anesthesia Postprocedure Evaluation (Signed)
 Anesthesia Post Note  Patient: Joanna Lambert  Procedure(s) Performed: XI ROBOTIC ASSISTED TOTAL HYSTERECTOMY WITH BILATERAL SALPINGO OOPHORECTOMY (Bilateral: Pelvis) CYSTOSCOPY (Urethra) LABIAPLASTY (Bilateral) XI ROBOTIC ASSISTED LAPAROSCOPIC LYSIS OF ADHESION (Pelvis)     Patient location during evaluation: PACU Anesthesia Type: General Level of consciousness: awake and alert Pain management: pain level controlled Vital Signs Assessment: post-procedure vital signs reviewed and stable Respiratory status: spontaneous breathing, nonlabored ventilation and respiratory function stable Cardiovascular status: stable and blood pressure returned to baseline Anesthetic complications: no   No notable events documented.  Last Vitals:  Vitals:   06/17/23 0712 06/17/23 1055  BP: 134/76 127/69  Pulse: 90 84  Resp: 16 15  Temp: 36.6 C (!) 36.4 C  SpO2: 98% 98%    Last Pain:  Vitals:   06/17/23 1055  TempSrc:   PainSc: Asleep                 Debby FORBES Like

## 2023-06-17 NOTE — Interval H&P Note (Signed)
 History and Physical Interval Note:  06/17/2023 8:25 AM  Joanna Lambert  has presented today for surgery, with the diagnosis of pelvic pain, ovarian mass, postmenopausal bleeding.  The various methods of treatment have been discussed with the patient and family. After consideration of risks, benefits and other options for treatment, the patient has consented to  Procedure(s): XI ROBOTIC ASSISTED TOTAL HYSTERECTOMY WITH BILATERAL SALPINGO OOPHORECTOMY (Bilateral) CYSTOSCOPY (N/A) LABIAPLASTY (Right) as a surgical intervention.  The patient's history has been reviewed, patient examined, no change in status, stable for surgery.  I have reviewed the patient's chart and labs.  Questions were answered to the patient's satisfaction.     [name]

## 2023-06-18 ENCOUNTER — Encounter (HOSPITAL_BASED_OUTPATIENT_CLINIC_OR_DEPARTMENT_OTHER): Payer: Self-pay | Admitting: Obstetrics and Gynecology

## 2023-06-18 ENCOUNTER — Encounter: Payer: Self-pay | Admitting: Obstetrics and Gynecology

## 2023-06-18 LAB — SURGICAL PATHOLOGY

## 2023-06-23 ENCOUNTER — Encounter: Payer: Self-pay | Admitting: Obstetrics and Gynecology

## 2023-06-24 NOTE — Progress Notes (Signed)
Patient with severe menorrhagia and will need cbc to know blood count before surgery. Patient with large uterus as well and could have intra-op bleeding. CBC is a medical necessity prior to surgery Dr. Karma Greaser

## 2023-07-01 ENCOUNTER — Encounter: Payer: Federal, State, Local not specified - PPO | Admitting: Obstetrics and Gynecology

## 2023-07-08 ENCOUNTER — Encounter: Payer: Self-pay | Admitting: Obstetrics and Gynecology

## 2023-07-08 ENCOUNTER — Ambulatory Visit (INDEPENDENT_AMBULATORY_CARE_PROVIDER_SITE_OTHER): Payer: Medicare Other | Admitting: Obstetrics and Gynecology

## 2023-07-08 VITALS — BP 122/88 | HR 77

## 2023-07-08 DIAGNOSIS — Z09 Encounter for follow-up examination after completed treatment for conditions other than malignant neoplasm: Secondary | ICD-10-CM

## 2023-07-08 NOTE — Progress Notes (Signed)
 Patient presents for 3 week postop from [facility], bilateral salpingectomy, cystoscopy. She is doing well. No fevers, VB, dysuria or severe abdominal pain. Labial swollen and tender a few days ago and has resolved now.  She is pleased with the outcome.  BP 122/88 (BP Location: Right Arm, Patient Position: Sitting, Cuff Size: Normal)   Pulse 77   LMP 05/12/2013   SpO2 98%   Abdomen: incisions I/c/d, NT, ND Labial sutures removed. Labia symmetric, no erythema   A/p PO from [facility] 2 weeks doing well Encouraged no heavy lifting, pushing, pulling greater than 10 lbs for full 8 weeks 2. Pelvic rest for the entire 10 wks 3. RTC with any concerns or with heavy bleeding, fevers or severe abdominal pain. 4.  Continue estrogen cream and can begin back on intrarosa  Dr. Karma Greaser

## 2023-07-21 ENCOUNTER — Other Ambulatory Visit (HOSPITAL_BASED_OUTPATIENT_CLINIC_OR_DEPARTMENT_OTHER): Payer: Self-pay | Admitting: Family

## 2023-07-21 DIAGNOSIS — Z1231 Encounter for screening mammogram for malignant neoplasm of breast: Secondary | ICD-10-CM

## 2023-08-04 ENCOUNTER — Other Ambulatory Visit: Payer: Self-pay | Admitting: Neurology

## 2023-08-04 ENCOUNTER — Other Ambulatory Visit: Payer: Self-pay | Admitting: Family

## 2023-08-04 DIAGNOSIS — E785 Hyperlipidemia, unspecified: Secondary | ICD-10-CM

## 2023-08-04 NOTE — Telephone Encounter (Signed)
 Pt stated she was not as at home right now and will call back.

## 2023-08-04 NOTE — Telephone Encounter (Signed)
 Please contact pt to schedule annual physical after 4/10.

## 2023-08-06 ENCOUNTER — Encounter: Payer: Self-pay | Admitting: Obstetrics and Gynecology

## 2023-08-06 ENCOUNTER — Ambulatory Visit (HOSPITAL_BASED_OUTPATIENT_CLINIC_OR_DEPARTMENT_OTHER)
Admission: RE | Admit: 2023-08-06 | Discharge: 2023-08-06 | Disposition: A | Discharge status: Home / Self Care | Source: Ambulatory Visit | Attending: Family | Admitting: Family

## 2023-08-06 ENCOUNTER — Encounter (HOSPITAL_BASED_OUTPATIENT_CLINIC_OR_DEPARTMENT_OTHER): Payer: Self-pay

## 2023-08-06 ENCOUNTER — Ambulatory Visit (HOSPITAL_BASED_OUTPATIENT_CLINIC_OR_DEPARTMENT_OTHER)
Admission: RE | Admit: 2023-08-06 | Discharge: 2023-08-06 | Disposition: A | Discharge status: Home / Self Care | Source: Ambulatory Visit | Attending: Obstetrics and Gynecology | Admitting: Obstetrics and Gynecology

## 2023-08-06 DIAGNOSIS — Z780 Asymptomatic menopausal state: Secondary | ICD-10-CM | POA: Insufficient documentation

## 2023-08-06 DIAGNOSIS — M8580 Other specified disorders of bone density and structure, unspecified site: Secondary | ICD-10-CM | POA: Diagnosis present

## 2023-08-06 DIAGNOSIS — Z1231 Encounter for screening mammogram for malignant neoplasm of breast: Secondary | ICD-10-CM | POA: Insufficient documentation

## 2023-08-12 ENCOUNTER — Ambulatory Visit (INDEPENDENT_AMBULATORY_CARE_PROVIDER_SITE_OTHER): Admitting: Family

## 2023-08-12 ENCOUNTER — Encounter: Payer: Self-pay | Admitting: Family

## 2023-08-12 VITALS — BP 127/81 | HR 84 | Temp 97.8°F | Resp 16 | Ht 64.0 in | Wt 135.0 lb

## 2023-08-12 DIAGNOSIS — E785 Hyperlipidemia, unspecified: Secondary | ICD-10-CM | POA: Diagnosis not present

## 2023-08-12 DIAGNOSIS — Z91030 Bee allergy status: Secondary | ICD-10-CM | POA: Diagnosis not present

## 2023-08-12 DIAGNOSIS — E559 Vitamin D deficiency, unspecified: Secondary | ICD-10-CM

## 2023-08-12 LAB — LIPID PANEL
Cholesterol: 159 mg/dL (ref 0–200)
HDL: 55.1 mg/dL (ref >39.00)
LDL Cholesterol: 85 mg/dL (ref 0–99)
NonHDL: 104.05
Total CHOL/HDL Ratio: 3
Triglycerides: 97 mg/dL (ref 0.0–149.0)
VLDL: 19.4 mg/dL (ref 0.0–40.0)

## 2023-08-12 LAB — VITAMIN D 25 HYDROXY (VIT D DEFICIENCY, FRACTURES): VITD: 28.42 ng/mL — ABNORMAL LOW (ref 30.00–100.00)

## 2023-08-12 MED ORDER — EPINEPHRINE 0.3 MG/0.3ML IJ SOAJ: 0.3000 mg | INTRAMUSCULAR | 1 refills | Status: AC | PRN

## 2023-08-12 NOTE — Progress Notes (Signed)
 Established Patient Office Visit  Subjective   Patient ID: Joanna Lambert, female    DOB: 05/11/1956  Age: 68 y.o. MRN: [id number]  Chief Complaint  Patient presents with   Hyperlipidemia    Here for follow up    Vitamin D deficiency    Here for follow up    Very pleasant 68 yo patient presents today for routine follow-up. She reports feeling well. States she is having some postnasal drip which she attributes to seasonal allergies. Denies other complaints.   Hyperlipidemia Pertinent negatives include no chest pain, myalgias or shortness of breath.    Review of Systems  Constitutional:  Negative for chills and fever.  HENT:  Negative for hearing loss and nosebleeds.   Eyes:  Negative for blurred vision and double vision.  Respiratory:  Negative for cough and shortness of breath.   Cardiovascular:  Negative for chest pain and leg swelling.  Gastrointestinal:  Negative for abdominal pain, constipation and diarrhea.  Genitourinary:  Negative for dysuria, frequency and urgency.  Musculoskeletal:  Negative for joint pain and myalgias.  Skin:  Negative for rash.  Neurological:  Positive for headaches. Negative for dizziness.       Has had recent migraines. Nurtec and Maxalt when needed.   Endo/Heme/Allergies:  Does not bruise/bleed easily.  Psychiatric/Behavioral:  Negative for depression.       Objective:     BP 127/81 (BP Location: Right Arm, Patient Position: Sitting, Cuff Size: Small)   Pulse 84   Temp 97.8 F (36.6 C) (Oral)   Resp 16   Ht 5\' 4"  (1.626 m)   Wt 135 lb (61.2 kg)   LMP 05/12/2013   SpO2 100%   BMI 23.17 kg/m    Physical Exam Vitals reviewed.  Constitutional:      Appearance: Normal appearance.  HENT:     Head: Normocephalic and atraumatic.     Right Ear: Tympanic membrane normal.     Left Ear: Tympanic membrane normal.     Nose: No congestion or rhinorrhea.     Mouth/Throat:     Mouth: Mucous membranes are moist.     Pharynx: Uvula  midline. Postnasal drip present.  Eyes:     Conjunctiva/sclera: Conjunctivae normal.     Pupils: Pupils are equal, round, and reactive to light.  Cardiovascular:     Rate and Rhythm: Normal rate and regular rhythm.     Pulses: Normal pulses.     Heart sounds: Normal heart sounds.  Pulmonary:     Effort: Pulmonary effort is normal.     Breath sounds: Normal breath sounds.  Abdominal:     General: Bowel sounds are normal.  Musculoskeletal:     Cervical back: Neck supple.  Skin:    General: Skin is warm and dry.     Capillary Refill: Capillary refill takes less than 2 seconds.  Neurological:     Mental Status: She is alert and oriented to person, place, and time.  Psychiatric:        Mood and Affect: Mood normal.        Behavior: Behavior normal.      Assessment & Plan:   -Migraines - has had more frequent migraines recently. She continues to use Nurtec as first-line treatment and uses Maxalt for persistent migraines. States she will follow up with neurology if symptoms persist.   -Hyperlipidemia - continues atorvastatin without issues. Redraw lipid panel today.  Lab Results  Component Value Date   CHOL 134 11/25/2022  CHOL 245 (H) 08/20/2022   CHOL 232 (H) 10/29/2021   Lab Results  Component Value Date   HDL 55.10 11/25/2022   HDL 55.50 08/20/2022   HDL 56.70 10/29/2021   Lab Results  Component Value Date   LDLCALC 61 11/25/2022   LDLCALC 171 (H) 08/20/2022   LDLCALC 153 (H) 10/29/2021   Lab Results  Component Value Date   TRIG 89.0 11/25/2022   TRIG 93.0 08/20/2022   TRIG 110.0 10/29/2021   Lab Results  Component Value Date   CHOLHDL 2 11/25/2022   CHOLHDL 4 08/20/2022   CHOLHDL 4 10/29/2021    -Vitamin D deficiency - continues OTC vitamin D supplementation. Redraw vitamin D level today.   Last vitamin D Lab Results  Component Value Date   VD25OH 50.17 11/25/2022    -Bee allergy - no recent allergic reactions. Needs EPI-PEN refill. Will send to  pharmacy.   Cristopher Peru, RN

## 2023-08-12 NOTE — Assessment & Plan Note (Signed)
  Migraines more frequent but controlled with Nurtec. Maxalt used for breakthrough headaches. No daily preventive medication needed per neurologist. Discussed potential influence of biotin and environmental factors. - Continue Nurtec for migraine management. - Use Maxalt as needed for breakthrough headaches. - Follow up with neurology if headaches become more frequent.

## 2023-08-12 NOTE — Assessment & Plan Note (Signed)
 Requested updated epi-pen. Rx sent.

## 2023-08-12 NOTE — Patient Instructions (Signed)
 VISIT SUMMARY:  Today, you came in for a follow-up on your migraine headaches and to review your routine lab work. We discussed the recent increase in the frequency of your migraines and your current treatment plan. We also reviewed your recent vaccinations and general health maintenance.  YOUR PLAN:  -MIGRAINE HEADACHES: Migraine headaches are severe headaches often accompanied by nausea, vomiting, and sensitivity to light and sound. Your migraines have been more frequent recently, but they are being managed with Nurtec. You should continue using Nurtec for your migraines and take Maxalt if you experience breakthrough headaches. If your headaches become more frequent, please follow up with your neurologist.  -GENERAL HEALTH MAINTENANCE: Your preventive care is up to date. You have received your pneumonia and RSV vaccines recently. We have updated your vaccination records accordingly.   INSTRUCTIONS:  Please continue using Nurtec for your migraines and take Maxalt as needed for breakthrough headaches. Follow up with your neurologist if your headaches become more frequent.

## 2023-08-12 NOTE — Progress Notes (Signed)
 Subjective:     Patient ID: Joanna Lambert, female    DOB: 06-04-1955, 68 y.o.   MRN: [id number]  Chief Complaint  Patient presents with   Hyperlipidemia    Here for follow up    Vitamin D deficiency    Here for follow up    Hyperlipidemia    Discussed the use of AI scribe software for clinical note transcription with the patient, who gave verbal consent to proceed.  History of Present Illness Joanna Lambert is a 68 year old female who presents for a follow-up on her migraine headaches and routine lab work.  She experiences more frequent migraine headaches recently. Nurtec has been effective in managing her migraines, and she has only needed to use Maxalt for breakthrough pain a few times. She had a period of three consecutive days of migraines after discontinuing biotin a week ago, during which she required medication. She is uncertain if the biotin was helping prevent the migraines. She does not currently use Nurtec as a preventive measure every other day.  She has had her pneumonia and RSV vaccinations updated recently, with the pneumonia shot administered in September 2024 and the RSV shot in October 2024.       Health Maintenance Due  Topic Date Due   Medicare Annual Wellness (AWV)  Never done   COVID-19 Vaccine (6 - 2024-25 season) 01/11/2023    Past Medical History:  Diagnosis Date   Chronic female pelvic pain    GERD (gastroesophageal reflux disease)    Heart murmur    History of adenomatous polyp of colon    Hyperlipidemia    Migraines    neruologist-- dr Everlena Cooper   Osteopenia    Ovarian mass    PMB (postmenopausal bleeding)    RLS (restless legs syndrome)    Seasonal allergies    seasonal   Varicose veins    Vitamin D deficiency 12/26/2014   Wears contact lenses     Past Surgical History:  Procedure Laterality Date   APPENDECTOMY  1991   BUNIONECTOMY Right 2011   CARPAL TUNNEL RELEASE Bilateral 2010   COLONOSCOPY WITH PROPOFOL  09/18/2021    Dr.Nandigam   CYSTOSCOPY N/A 06/17/2023   Procedure: CYSTOSCOPY;  Surgeon: Earley Favor, MD;  Location: [facility];  Service: Gynecology;  Laterality: N/A;   DILATION AND CURETTAGE OF UTERUS     yrs ago   LABIOPLASTY Bilateral 06/17/2023   Procedure: LABIAPLASTY;  Surgeon: Earley Favor, MD;  Location: [facility];  Service: Gynecology;  Laterality: Bilateral;   ROBOTIC ASSISTED LAPAROSCOPIC LYSIS OF ADHESION  06/17/2023   Procedure: XI ROBOTIC ASSISTED LAPAROSCOPIC LYSIS OF ADHESION;  Surgeon: Earley Favor, MD;  Location: [facility];  Service: Gynecology;;   ROBOTIC ASSISTED TOTAL HYSTERECTOMY WITH BILATERAL SALPINGO OOPHERECTOMY Bilateral 06/17/2023   Procedure: XI ROBOTIC ASSISTED TOTAL HYSTERECTOMY WITH BILATERAL SALPINGO OOPHORECTOMY;  Surgeon: Earley Favor, MD;  Location: [facility];  Service: Gynecology;  Laterality: Bilateral;    Family History  Problem Relation Age of Onset   Diabetes Mother    Hypertension Mother 24   Hyperlipidemia Mother    Heart disease Mother        Aortic stenosis   Heart disease Father 74       pacemaker   Atrial fibrillation Father 76   Peripheral vascular disease Father        s/p Carotid Edartarectomy   Dementia Father  Migraines Daughter     Social History   Socioeconomic History   Marital status: Married    Spouse name: Not on file   Number of children: Not on file   Years of education: Not on file   Highest education level: Associate degree: academic program  Occupational History   Not on file  Tobacco Use   Smoking status: Never   Smokeless tobacco: Never  Vaping Use   Vaping status: Never Used  Substance and Sexual Activity   Alcohol use: Yes    Comment: occasional   Drug use: Never   Sexual activity: Not Currently    Partners: Male    Birth control/protection: Post-menopausal, Surgical    Comment: 1st intercourse 68 yo-Fewer than 5  partners, hysterectomy  Other Topics Concern   Not on file  Social History Narrative   Married   2 children (daughter lives here- she has two sons)   Son lives in [place]   Retired from Chief of Staff (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     Left handed   Social Drivers of Health   Financial Resource Strain: Low Risk  (08/05/2023)   Overall Financial Resource Strain (CARDIA)    Difficulty of Paying Living Expenses: Not hard at all  Food Insecurity: No Food Insecurity (08/05/2023)   Hunger Vital Sign    Worried About Running Out of Food in the Last Year: Never true    Ran Out of Food in the Last Year: Never true  Transportation Needs: No Transportation Needs (08/05/2023)   PRAPARE - Administrator, Civil Service (Medical): No    Lack of Transportation (Non-Medical): No  Physical Activity: Sufficiently Active (08/05/2023)   Exercise Vital Sign    Days of Exercise per Week: 6 days    Minutes of Exercise per Session: 30 min  Stress: No Stress Concern Present (08/05/2023)   [employer] of Occupational Health - Occupational Stress Questionnaire    Feeling of Stress : Only a little  Social Connections: Socially Integrated (08/05/2023)   Social Connection and Isolation Panel [NHANES]    Frequency of Communication with Friends and Family: More than three times a week    Frequency of Social Gatherings with Friends and Family: Three times a week    Attends Religious Services: 1 to 4 times per year    Active Member of Clubs or Organizations: Yes    Attends Banker Meetings: 1 to 4 times per year    Marital Status: Married  Catering manager Violence: Not on file    Outpatient Medications Prior to Visit  Medication Sig Dispense Refill   aspirin 81 MG tablet Take 81 mg by mouth daily.     atorvastatin (LIPITOR) 20 MG tablet TAKE 1 TABLET(20 MG) BY MOUTH DAILY 90 tablet 0   Biotin 5000 MCG CAPS Take 1 capsule by mouth daily.     calcium  carbonate (OS-CAL - DOSED IN MG OF ELEMENTAL CALCIUM) 1250 (500 Ca) MG tablet Take 1 tablet by mouth daily with breakfast.     clotrimazole-betamethasone (LOTRISONE) cream APPLY EXTERNALLY TO THE AFFECTED AREA TWICE DAILY (Patient taking differently: Apply 1 Application topically 2 (two) times daily as needed.) 45 g 1   Dihydroxyaluminum Sod Carb (ROLAIDS PO) Take by mouth as needed.     estradiol (ESTRACE VAGINAL) 0.1 MG/GM vaginal cream Rub pea size amount each night for 3 weeks then 3 times a week thereafter. (Patient taking differently: Place  1 Applicatorful vaginally daily. Rub pea size amount each night for 3 weeks then 3 times a week thereafter.) 42.5 g 12   metroNIDAZOLE (METROCREAM) 0.75 % cream Apply 1 application  topically daily as needed.  11   Prasterone (INTRAROSA) 6.5 MG INST Place 1 suppository vaginally at bedtime as needed. 30 each 12   Rimegepant Sulfate (NURTEC) 75 MG TBDP DISSOLVE 1 TABLET ON THE TONGUE DAILY AS NEEDED 8 tablet 1   rizatriptan (MAXALT) 10 MG tablet TAKE BY MOUTH AS DIRECTED MAY REPEAT IN 2 HOURS IF NEEDED.  MAXIMUM 2 TABLETS IN 24 HOURS (Patient taking differently: Take 10 mg by mouth as needed. TAKE BY MOUTH AS DIRECTED MAY REPEAT IN 2 HOURS IF NEEDED.  MAXIMUM 2 TABLETS IN 24 HOURS) 10 tablet 11   EPINEPHrine (EPIPEN 2-PAK) 0.3 mg/0.3 mL IJ SOAJ injection Inject 0.3 mg into the muscle as needed for anaphylaxis. 1 each 1   ibuprofen (ADVIL) 800 MG tablet Take 1 tablet (800 mg total) by mouth every 8 (eight) hours as needed (nausea). 10 tablet 0   metoCLOPramide (REGLAN) 10 MG tablet Take 1 tablet (10 mg total) by mouth every 8 (eight) hours as needed. (Patient not taking: Reported on 07/08/2023) 10 tablet 0   neomycin-polymyxin-pramoxine (NEOSPORIN PLUS) 1 % cream Apply topically 2 (two) times daily. (Patient not taking: Reported on 07/08/2023) 14.2 g 0   oxyCODONE (ROXICODONE) 5 MG immediate release tablet Take 1 tablet (5 mg total) by mouth every 4 (four) hours as  needed for severe pain (pain score 7-10). (Patient not taking: Reported on 06/12/2023) 30 tablet 0   No facility-administered medications prior to visit.    Allergies  Allergen Reactions   Bee Venom Swelling    Local swelling   Adhesive [Tape] Rash    ROS See HPI    Objective:    Physical Exam Constitutional:      General: She is not in acute distress.    Appearance: Normal appearance. She is well-developed.  HENT:     Head: Normocephalic and atraumatic.     Right Ear: External ear normal.     Left Ear: External ear normal.  Eyes:     General: No scleral icterus. Neck:     Thyroid: No thyromegaly.  Cardiovascular:     Rate and Rhythm: Normal rate and regular rhythm.     Heart sounds: Normal heart sounds. No murmur heard. Pulmonary:     Effort: Pulmonary effort is normal. No respiratory distress.     Breath sounds: Normal breath sounds. No wheezing.  Musculoskeletal:     Cervical back: Neck supple.  Skin:    General: Skin is warm and dry.  Neurological:     Mental Status: She is alert and oriented to person, place, and time.  Psychiatric:        Mood and Affect: Mood normal.        Behavior: Behavior normal.        Thought Content: Thought content normal.        Judgment: Judgment normal.      BP 127/81 (BP Location: Right Arm, Patient Position: Sitting, Cuff Size: Small)   Pulse 84   Temp 97.8 F (36.6 C) (Oral)   Resp 16   Ht 5\' 4"  (1.626 m)   Wt 135 lb (61.2 kg)   LMP 05/12/2013   SpO2 100%   BMI 23.17 kg/m  Wt Readings from Last 3 Encounters:  08/12/23 135 lb (61.2 kg)  06/17/23 135  lb 3.2 oz (61.3 kg)  05/27/23 135 lb (61.2 kg)       Assessment & Plan:   Problem List Items Addressed This Visit       Unprioritized   Vitamin D deficiency - Primary   Update vitamin D level.      Relevant Orders   VITAMIN D 25 Hydroxy (Vit-D Deficiency, Fractures)   Hyperlipidemia   Tolerating atorvastatin.  Update lipid panel.      Relevant  Medications   EPINEPHrine (EPIPEN 2-PAK) 0.3 mg/0.3 mL IJ SOAJ injection   Other Relevant Orders   Lipid panel   Bee allergy status   Requested updated epi-pen. Rx sent.       Relevant Medications   EPINEPHrine (EPIPEN 2-PAK) 0.3 mg/0.3 mL IJ SOAJ injection    General Health Maintenance Preventive care up to date. Pneumonia and RSV vaccines received. Declined HIV screening. - Update vaccination records with pneumonia and RSV vaccine dates. - Remove HIV screening from preventive care list at pt request.  I have discontinued Kiela L. Arvie's oxyCODONE, ibuprofen, metoCLOPramide, and NEOSPORIN PLUS. I am also having her maintain her aspirin, calcium carbonate, metroNIDAZOLE, clotrimazole-betamethasone, Biotin, rizatriptan, Intrarosa, estradiol, Dihydroxyaluminum Sod Carb (ROLAIDS PO), Nurtec, atorvastatin, and EPINEPHrine.  Meds ordered this encounter  Medications   EPINEPHrine (EPIPEN 2-PAK) 0.3 mg/0.3 mL IJ SOAJ injection    Sig: Inject 0.3 mg into the muscle as needed for anaphylaxis.    Dispense:  1 each    Refill:  1    Supervising Provider:   Danise Edge A [[id number]]

## 2023-08-12 NOTE — Assessment & Plan Note (Signed)
Update vitamin D level. 

## 2023-08-12 NOTE — Assessment & Plan Note (Signed)
Tolerating atorvastatin.  Update lipid panel.

## 2023-08-13 ENCOUNTER — Other Ambulatory Visit: Payer: Self-pay | Admitting: Family

## 2023-08-13 DIAGNOSIS — E559 Vitamin D deficiency, unspecified: Secondary | ICD-10-CM

## 2023-08-13 MED ORDER — VITAMIN D3 75 MCG (3000 UT) PO TABS
1.0000 | ORAL_TABLET | Freq: Every day | ORAL | Status: AC
Start: 2023-08-13 — End: ?

## 2023-08-18 ENCOUNTER — Ambulatory Visit (INDEPENDENT_AMBULATORY_CARE_PROVIDER_SITE_OTHER): Payer: Federal, State, Local not specified - PPO | Admitting: Obstetrics and Gynecology

## 2023-08-18 ENCOUNTER — Encounter: Payer: Self-pay | Admitting: Obstetrics and Gynecology

## 2023-08-18 VITALS — BP 124/84 | HR 87 | Ht 64.0 in | Wt 135.0 lb

## 2023-08-18 DIAGNOSIS — Z09 Encounter for follow-up examination after completed treatment for conditions other than malignant neoplasm: Secondary | ICD-10-CM

## 2023-08-18 NOTE — Progress Notes (Signed)
 Patient presents for 10 week postop from [facility], bilateral salpingectomy, cystoscopy. She is doing well. No fevers, VB, dysuria or severe abdominal pain. She is glad she had the procedure and reports she is grateful every day. Reports improvement in nocturia she had prior to surgery.   BP 124/84 (BP Location: Left Arm, Patient Position: Sitting, Cuff Size: Normal)   Pulse 87   Ht 5\' 4"  (1.626 m)   Wt 135 lb (61.2 kg)   LMP 05/12/2013   SpO2 97%   BMI 23.17 kg/m   SVE: sutures seen and dissolving, normal discharge, no bleeding, good support, intact Using vaginal estrogen daily Mucosa slightly atrophic  A/p PO from [facility] 10 weeks doing well Resume activities 2. Pelvic rest for additional 3 wks. RTC in 3 weeks for exam 3. Increase estrogen to twice daily until seen again to support vaginal cuff healing.  Dr. Karma Greaser

## 2023-09-07 ENCOUNTER — Ambulatory Visit: Admitting: Obstetrics and Gynecology

## 2023-09-08 ENCOUNTER — Ambulatory Visit (INDEPENDENT_AMBULATORY_CARE_PROVIDER_SITE_OTHER): Admitting: Obstetrics and Gynecology

## 2023-09-08 ENCOUNTER — Encounter: Payer: Self-pay | Admitting: Obstetrics and Gynecology

## 2023-09-08 VITALS — BP 110/72 | HR 86

## 2023-09-08 DIAGNOSIS — Z09 Encounter for follow-up examination after completed treatment for conditions other than malignant neoplasm: Secondary | ICD-10-CM

## 2023-09-08 NOTE — Progress Notes (Signed)
 Patient presents for 10 week postop from [facility], bilateral salpingectomy, cystoscopy. She is doing well. No fevers, VB, dysuria or severe abdominal pain.  BP 110/72   Pulse 86   LMP 05/12/2013   SpO2 97%   SVE: sutures seen and dissolving, normal discharge, no bleeding  A/p PO from [facility] 10 weeks doing well Resume activities.  RTC in 3 weeks for exam or sooner with any concerns.  Continue estrogen vaginal cream daily until seen again 3. Encouraged annual mammograms and resume annual care with PCP  Dr. Tia Flowers

## 2023-09-15 ENCOUNTER — Ambulatory Visit (INDEPENDENT_AMBULATORY_CARE_PROVIDER_SITE_OTHER)

## 2023-09-15 VITALS — Ht 64.0 in | Wt 135.0 lb

## 2023-09-15 DIAGNOSIS — Z0000 Encounter for general adult medical examination without abnormal findings: Secondary | ICD-10-CM | POA: Diagnosis not present

## 2023-09-15 NOTE — Progress Notes (Signed)
 Subjective:   Joanna Lambert is a 68 y.o. who presents for a Medicare Wellness preventive visit.  Visit Complete: Virtual I connected with  [name] on 09/15/23 by a audio enabled telemedicine application and verified that I am speaking with the correct person using two identifiers.  Patient Location: Home  Provider Location: Home Office  I discussed the limitations of evaluation and management by telemedicine. The patient expressed understanding and agreed to proceed.  Vital Signs: Because this visit was a virtual/telehealth visit, some criteria may be missing or patient reported. Any vitals not documented were not able to be obtained and vitals that have been documented are patient reported.    Persons Participating in Visit: Patient.  AWV Questionnaire: Yes: Patient Medicare AWV questionnaire was completed by the patient on 09/08/23; I have confirmed that all information answered by patient is correct and no changes since this date.  Cardiac Risk Factors include: advanced age (>41men, >26 women)     Objective:    Today's Vitals   09/15/23 0812  Weight: 135 lb (61.2 kg)  Height: 5\' 4"  (1.626 m)   Body mass index is 23.17 kg/m.     09/15/2023    8:18 AM 06/17/2023    7:08 AM 11/26/2022    1:54 PM 11/25/2021    2:53 PM 11/28/2019    2:46 PM 06/13/2016    7:44 AM 05/30/2016    2:25 PM  Advanced Directives  Does Patient Have a Medical Advance Directive? Yes No No Yes Yes Yes Yes  Type of Estate agent of [place];Living will   Healthcare Power of [place];Living will   Healthcare Power of [place];Living will  Does patient want to make changes to medical advance directive?     No - Patient declined    Copy of Healthcare Power of Attorney in Chart? No - copy requested        Would patient like information on creating a medical advance directive?  No - Patient declined         Current Medications (verified) Outpatient Encounter Medications as of  09/15/2023  Medication Sig   aspirin 81 MG tablet Take 81 mg by mouth daily.   atorvastatin  (LIPITOR) 20 MG tablet TAKE 1 TABLET(20 MG) BY MOUTH DAILY   Biotin 5000 MCG CAPS Take 1 capsule by mouth daily.   calcium  carbonate (OS-CAL - DOSED IN MG OF ELEMENTAL CALCIUM ) 1250 (500 Ca) MG tablet Take 1 tablet by mouth daily with breakfast.   Cholecalciferol (VITAMIN D3) 75 MCG (3000 UT) TABS Take 1 tablet by mouth daily at 6 (six) AM.   clotrimazole -betamethasone  (LOTRISONE ) cream APPLY EXTERNALLY TO THE AFFECTED AREA TWICE DAILY (Patient taking differently: Apply 1 Application topically 2 (two) times daily as needed.)   Dihydroxyaluminum Sod Carb (ROLAIDS PO) Take by mouth as needed.   EPINEPHrine  (EPIPEN  2-PAK) 0.3 mg/0.3 mL IJ SOAJ injection Inject 0.3 mg into the muscle as needed for anaphylaxis. (Patient not taking: Reported on 09/08/2023)   estradiol  (ESTRACE  VAGINAL) 0.1 MG/GM vaginal cream Rub pea size amount each night for 3 weeks then 3 times a week thereafter. (Patient taking differently: Place 1 Applicatorful vaginally daily. Rub pea size amount each night for 3 weeks then 3 times a week thereafter.)   metroNIDAZOLE (METROCREAM) 0.75 % cream Apply 1 application  topically daily as needed.   Prasterone  (INTRAROSA ) 6.5 MG INST Place 1 suppository vaginally at bedtime as needed.   Rimegepant Sulfate (NURTEC) 75 MG TBDP DISSOLVE 1  TABLET ON THE TONGUE DAILY AS NEEDED   rizatriptan  (MAXALT ) 10 MG tablet TAKE BY MOUTH AS DIRECTED MAY REPEAT IN 2 HOURS IF NEEDED.  MAXIMUM 2 TABLETS IN 24 HOURS (Patient taking differently: Take 10 mg by mouth as needed. TAKE BY MOUTH AS DIRECTED MAY REPEAT IN 2 HOURS IF NEEDED.  MAXIMUM 2 TABLETS IN 24 HOURS)   UNABLE TO FIND Med Name: ointment for eyes at bedtime Muro 128   No facility-administered encounter medications on file as of 09/15/2023.    Allergies (verified) Bee venom and Adhesive [tape]   History: Past Medical History:  Diagnosis Date   Chronic  female pelvic pain    GERD (gastroesophageal reflux disease)    Heart murmur    History of adenomatous polyp of colon    Hyperlipidemia    Migraines    neruologist-- dr Festus Hubert   Osteopenia    Ovarian mass    PMB (postmenopausal bleeding)    RLS (restless legs syndrome)    Seasonal allergies    seasonal   Varicose veins    Vitamin D  deficiency 12/26/2014   Wears contact lenses    Past Surgical History:  Procedure Laterality Date   APPENDECTOMY  1991   BUNIONECTOMY Right 2011   CARPAL TUNNEL RELEASE Bilateral 2010   COLONOSCOPY WITH PROPOFOL   09/18/2021   Dr.Nandigam   CYSTOSCOPY N/A 06/17/2023   Procedure: CYSTOSCOPY;  Surgeon: Reinaldo Caras, MD;  [facility];  Service: Gynecology;  Laterality: N/A;   DILATION AND CURETTAGE OF UTERUS     yrs ago   LABIOPLASTY Bilateral 06/17/2023   Procedure: LABIAPLASTY;  Surgeon: Reinaldo Caras, MD;  Location: [facility];  Service: Gynecology;  Laterality: Bilateral;   ROBOTIC ASSISTED LAPAROSCOPIC LYSIS OF ADHESION  06/17/2023   Procedure: XI ROBOTIC ASSISTED LAPAROSCOPIC LYSIS OF ADHESION;  Surgeon: Reinaldo Caras, MD;  Location: [facility];  Service: Gynecology;;   ROBOTIC ASSISTED TOTAL HYSTERECTOMY WITH BILATERAL SALPINGO OOPHERECTOMY Bilateral 06/17/2023   Procedure: XI ROBOTIC ASSISTED TOTAL HYSTERECTOMY WITH BILATERAL SALPINGO OOPHORECTOMY;  Surgeon: Reinaldo Caras, MD;  Location: [facility];  Service: Gynecology;  Laterality: Bilateral;   Family History  Problem Relation Age of Onset   Diabetes Mother    Hypertension Mother 75   Hyperlipidemia Mother    Heart disease Mother        Aortic stenosis   Heart disease Father 14       pacemaker   Atrial fibrillation Father 90   Peripheral vascular disease Father        s/p Carotid Edartarectomy   Dementia Father    Migraines Daughter    Social History   Socioeconomic History   Marital  status: Married    Spouse name: Not on file   Number of children: Not on file   Years of education: Not on file   Highest education level: Associate degree: academic program  Occupational History   Not on file  Tobacco Use   Smoking status: Never   Smokeless tobacco: Never  Vaping Use   Vaping status: Never Used  Substance and Sexual Activity   Alcohol use: Yes    Comment: occasional   Drug use: Never   Sexual activity: Not Currently    Partners: Male    Birth control/protection: Post-menopausal, Surgical    Comment: 1st intercourse 68 yo-Fewer than 5 partners, hysterectomy  Other Topics Concern   Not on file  Social History Narrative  Married   2 children (daughter lives here- she has two sons)   Son lives in [place]   Retired from Chief of Staff (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     Left handed   Social Drivers of Health   Financial Resource Lambert: Low Risk  (09/15/2023)   Overall Financial Resource Lambert (CARDIA)    Difficulty of Paying Living Expenses: Not hard at all  Food Insecurity: No Food Insecurity (09/15/2023)   Hunger Vital Sign    Worried About Running Out of Food in the Last Year: Never true    Ran Out of Food in the Last Year: Never true  Transportation Needs: No Transportation Needs (09/15/2023)   PRAPARE - Administrator, Civil Service (Medical): No    Lack of Transportation (Non-Medical): No  Physical Activity: Sufficiently Active (09/15/2023)   Exercise Vital Sign    Days of Exercise per Week: 6 days    Minutes of Exercise per Session: 30 min  Stress: No Stress Concern Present (09/15/2023)   [employer] of Occupational Health - Occupational Stress Questionnaire    Feeling of Stress : Not at all  Social Connections: Socially Integrated (09/15/2023)   Social Connection and Isolation Panel [NHANES]    Frequency of Communication with Friends and Family: More than three times a week    Frequency of Social  Gatherings with Friends and Family: More than three times a week    Attends Religious Services: More than 4 times per year    Active Member of [employer] or Organizations: Yes    Attends Engineer, structural: More than 4 times per year    Marital Status: Married    Tobacco Counseling Counseling given: Not Answered    Clinical Intake:  Pre-visit preparation completed: Yes  Pain : No/denies pain     BMI - recorded: 23.17 Nutritional Status: BMI of 19-24  Normal Nutritional Risks: None Diabetes: No  No results found for: "HGBA1C"   How often do you need to have someone help you when you read instructions, pamphlets, or other written materials from your doctor or pharmacy?: 1 - Never  Interpreter Needed?: No  Information entered by :: Farris Hong LPN   Activities of Daily Living     09/15/2023    8:17 AM 09/08/2023    7:49 AM  In your present state of health, do you have any difficulty performing the following activities:  Hearing? 0 0  Vision? 0 0  Difficulty concentrating or making decisions? 0 0  Walking or climbing stairs? 0 0  Dressing or bathing? 0 0  Doing errands, shopping? 0 0  Preparing Food and eating ? N N  Using the Toilet? N N  In the past six months, have you accidently leaked urine? N N  Do you have problems with loss of bowel control? N N  Managing your Medications? N N  Managing your Finances? N N  Housekeeping or managing your Housekeeping? N N    Patient Care Team: Dorrene Gaucher, NP as PCP - General (Internal Medicine) Early, Sherre Docker, MD (Inactive) as Consulting Physician (Vascular Surgery) Andee Bamberger, NP as Nurse Practitioner (Gynecology) Merriam Abbey, DO as Consulting Physician (Neurology)  Indicate any recent Medical Services you may have received from other than [facility] providers in the past year (date may be approximate).     Assessment:   This is a routine wellness examination for Betti.  Hearing/Vision  screen Hearing  Screening - Comments:: Denies hearing difficulties   Vision Screening - Comments:: Wears rx glasses - up to date with routine eye exams with  [facility]   Goals Addressed               This Visit's Progress     Remain active (pt-stated)         Depression Screen     09/15/2023    8:17 AM 08/20/2022    8:40 AM 07/29/2021    7:06 AM 07/27/2020    7:20 AM 07/29/2017    9:49 AM 07/07/2016    4:20 PM  PHQ 2/9 Scores  PHQ - 2 Score 0 0 0 0 0 0  PHQ- 9 Score  0   1 1    Fall Risk     09/15/2023    8:17 AM 09/08/2023    7:49 AM 02/10/2023    2:04 PM 11/26/2022    1:54 PM 08/20/2022    8:40 AM  Fall Risk   Falls in the past year? 0 0 0 0 0  Number falls in past yr: 0  0 0 0  Injury with Fall? 0  0 0 0  Risk for fall due to : No Fall Risks  No Fall Risks  No Fall Risks  Follow up Falls prevention discussed;Falls evaluation completed  Falls evaluation completed Falls evaluation completed Falls evaluation completed    MEDICARE RISK AT HOME:  Medicare Risk at Home Any stairs in or around the home?: No If so, are there any without handrails?: No Home free of loose throw rugs in walkways, pet beds, electrical cords, etc?: Yes Adequate lighting in your home to reduce risk of falls?: Yes Life alert?: No Use of a cane, walker or w/c?: No Grab bars in the bathroom?: No Shower chair or bench in shower?: No Elevated toilet seat or a handicapped toilet?: No  TIMED UP AND GO:  Was the test performed?  No  Cognitive Function: 6CIT completed        09/15/2023    8:18 AM  6CIT Screen  What Year? 0 points  What month? 0 points  What time? 0 points  Count back from 20 0 points  Months in reverse 0 points  Repeat phrase 0 points  Total Score 0 points    Immunizations Immunization History  Administered Date(s) Administered   Hepatitis A, Adult 07/29/2021, 01/30/2022   Influenza Inj Mdck Quad Pf 02/16/2018   Influenza, Seasonal, Injecte, Preservative Fre  04/30/2008   Influenza,inj,Quad PF,6+ Mos 02/16/2015, 03/01/2016, 02/09/2017, 02/28/2017, 03/14/2020, 02/23/2021   Influenza-Unspecified 02/09/2014, 02/16/2018, 01/02/2019, 02/14/2021   PFIZER(Purple Top)SARS-COV-2 Vaccination 06/14/2019, 07/05/2019, 02/22/2020, 08/08/2020   PNEUMOCOCCAL CONJUGATE-20 01/25/2023   Pfizer Covid-19 Vaccine Bivalent Booster 43yrs & up 03/25/2021   RSV,unspecified 03/02/2023   Td 07/29/2021   Tdap 02/19/2011, 05/13/2011   Zoster Recombinant(Shingrix) 09/22/2017, 11/25/2017    Screening Tests Health Maintenance  Topic Date Due   COVID-19 Vaccine (6 - 2024-25 season) 01/11/2023   INFLUENZA VACCINE  12/11/2023   MAMMOGRAM  08/05/2024   Medicare Annual Wellness (AWV)  09/14/2024   Colonoscopy  09/18/2028   DTaP/Tdap/Td (4 - Td or Tdap) 07/30/2031   Pneumonia Vaccine 48+ Years old  Completed   DEXA SCAN  Completed   Hepatitis C Screening  Completed   Zoster Vaccines- Shingrix  Completed   HPV VACCINES  Aged Out   Meningococcal B Vaccine  Aged Out   HIV Screening  Discontinued  Health Maintenance  Health Maintenance Due  Topic Date Due   COVID-19 Vaccine (6 - 2024-25 season) 01/11/2023   Health Maintenance Items Addressed:   Additional Screening:  Vision Screening: Recommended annual ophthalmology exams for early detection of glaucoma and other disorders of the eye.  Dental Screening: Recommended annual dental exams for proper oral hygiene  Community Resource Referral / Chronic Care Management: CRR required this visit?  No   CCM required this visit?  No     Plan:     I have personally reviewed and noted the following in the patient's chart:   Medical and social history Use of alcohol, tobacco or illicit drugs  Current medications and supplements including opioid prescriptions. Patient is not currently taking opioid prescriptions. Functional ability and status Nutritional status Physical activity Advanced directives List of other  physicians Hospitalizations, surgeries, and ER visits in previous 12 months Vitals Screenings to include cognitive, depression, and falls Referrals and appointments  In addition, I have reviewed and discussed with patient certain preventive protocols, quality metrics, and best practice recommendations. A written personalized care plan for preventive services as well as general preventive health recommendations were provided to patient.     Dewayne Ford, LPN   09/16/8467   After Visit Summary: (MyChart) Due to this being a telephonic visit, the after visit summary with patients personalized plan was offered to patient via MyChart   Notes: Nothing significant to report at this time.

## 2023-09-15 NOTE — Patient Instructions (Addendum)
 Joanna Lambert , Thank you for taking time to come for your Medicare Wellness Visit. I appreciate your ongoing commitment to your health goals. Please review the following plan we discussed and let me know if I can assist you in the future.   Referrals/Orders/Follow-Ups/Clinician Recommendations:   This is a list of the screening recommended for you and due dates:  Health Maintenance  Topic Date Due   COVID-19 Vaccine (6 - 2024-25 season) 01/11/2023   Flu Shot  12/11/2023   Mammogram  08/05/2024   Medicare Annual Wellness Visit  09/14/2024   Colon Cancer Screening  09/18/2028   DTaP/Tdap/Td vaccine (4 - Td or Tdap) 07/30/2031   Pneumonia Vaccine  Completed   DEXA scan (bone density measurement)  Completed   Hepatitis C Screening  Completed   Zoster (Shingles) Vaccine  Completed   HPV Vaccine  Aged Out   Meningitis B Vaccine  Aged Out   HIV Screening  Discontinued    Advanced directives: (Copy Requested) Please bring a copy of your health care power of attorney and living will to the office to be added to your chart at your convenience. You can mail to [facility] [address] [place], [place] [address] or email to [email or web address]  Next Medicare Annual Wellness Visit scheduled for next year: Yes

## 2023-10-01 ENCOUNTER — Ambulatory Visit (INDEPENDENT_AMBULATORY_CARE_PROVIDER_SITE_OTHER): Admitting: Obstetrics and Gynecology

## 2023-10-01 ENCOUNTER — Encounter: Payer: Self-pay | Admitting: Obstetrics and Gynecology

## 2023-10-01 VITALS — BP 110/68 | HR 75

## 2023-10-01 DIAGNOSIS — Z09 Encounter for follow-up examination after completed treatment for conditions other than malignant neoplasm: Secondary | ICD-10-CM

## 2023-10-01 NOTE — Progress Notes (Signed)
 Patient presents for  final postop from [facility], bilateral salpingectomy, cystoscopy. She is doing well. No fevers, VB, dysuria or severe abdominal pain.  BP 110/68   Pulse 75   LMP 05/12/2013   SpO2 99%   SVE: sutures dissolved, good vaginal support, normal discharge, no bleeding  A/p PO from [facility] weeks doing well Resume all activities. Resume vaginal estrogen 3 times a week 3. Encouraged annual mammograms.  RTC with any concerns 4. Dxa in 2 years  Dr. Tia Flowers

## 2023-10-02 ENCOUNTER — Ambulatory Visit: Admitting: Obstetrics and Gynecology

## 2023-10-14 ENCOUNTER — Telehealth: Payer: Self-pay | Admitting: Pharmacist

## 2023-10-14 NOTE — Telephone Encounter (Signed)
 Pharmacy Patient Advocate Encounter   Received notification from Patient Pharmacy that prior authorization for Nurtec 75MG  dispersible tablets is required/requested.   Insurance verification completed.   The patient is insured through [facility] .   Per test claim: PA required; PA submitted to above mentioned insurance via CoverMyMeds Key/confirmation #/EOC [id number] Status is pending

## 2023-10-15 NOTE — Telephone Encounter (Signed)
 Pharmacy Patient Advocate Encounter  Received notification from [facility] that Prior Authorization for Nurtec 75mg has been APPROVED from 07/16/2023 to 10/13/2024

## 2023-10-23 ENCOUNTER — Encounter: Payer: Self-pay | Admitting: Obstetrics and Gynecology

## 2023-11-27 ENCOUNTER — Other Ambulatory Visit: Payer: Self-pay

## 2023-11-27 DIAGNOSIS — E785 Hyperlipidemia, unspecified: Secondary | ICD-10-CM

## 2023-11-27 MED ORDER — ATORVASTATIN CALCIUM 20 MG PO TABS: 20.0000 mg | ORAL_TABLET | Freq: Every day | ORAL | 0 refills | Status: AC

## 2024-02-09 ENCOUNTER — Encounter (INDEPENDENT_AMBULATORY_CARE_PROVIDER_SITE_OTHER): Payer: Self-pay | Admitting: Family

## 2024-02-09 ENCOUNTER — Encounter: Payer: Self-pay | Admitting: Family

## 2024-02-09 DIAGNOSIS — Z8419 Family history of other disorders of kidney and ureter: Secondary | ICD-10-CM

## 2024-02-09 DIAGNOSIS — Z841 Family history of disorders of kidney and ureter: Secondary | ICD-10-CM | POA: Diagnosis not present

## 2024-02-09 NOTE — Telephone Encounter (Signed)

## 2024-02-12 ENCOUNTER — Other Ambulatory Visit (INDEPENDENT_AMBULATORY_CARE_PROVIDER_SITE_OTHER)

## 2024-02-12 DIAGNOSIS — Z8419 Family history of other disorders of kidney and ureter: Secondary | ICD-10-CM

## 2024-02-12 LAB — URINALYSIS, ROUTINE W REFLEX MICROSCOPIC
Bilirubin Urine: NEGATIVE
Hgb urine dipstick: NEGATIVE
Ketones, ur: NEGATIVE
Leukocytes,Ua: NEGATIVE
Nitrite: NEGATIVE
Specific Gravity, Urine: <=1.005 — AB (ref 1.000–1.030)
Total Protein, Urine: NEGATIVE
Urine Glucose: NEGATIVE
Urobilinogen, UA: 0.2 (ref 0.0–1.0)
pH: 5.5 (ref 5.0–8.0)

## 2024-02-12 LAB — MICROALBUMIN / CREATININE URINE RATIO
Creatinine,U: 26.6 mg/dL
Microalb Creat Ratio: UNDETERMINED mg/g (ref 0.0–30.0)
Microalb, Ur: <0.7 mg/dL

## 2024-02-12 LAB — COMPREHENSIVE METABOLIC PANEL WITH GFR
ALT: 19 U/L (ref 0–35)
AST: 20 U/L (ref 0–37)
Albumin: 4.8 g/dL (ref 3.5–5.2)
Alkaline Phosphatase: 82 U/L (ref 39–117)
BUN: 13 mg/dL (ref 6–23)
CO2: 31 meq/L (ref 19–32)
Calcium: 10.1 mg/dL (ref 8.4–10.5)
Chloride: 99 meq/L (ref 96–112)
Creatinine, Ser: 0.85 mg/dL (ref 0.40–1.20)
GFR: 71.41 mL/min (ref >60.00)
Glucose, Bld: 99 mg/dL (ref 70–99)
Potassium: 5.1 meq/L (ref 3.5–5.1)
Sodium: 139 meq/L (ref 135–145)
Total Bilirubin: 0.7 mg/dL (ref 0.2–1.2)
Total Protein: 7.6 g/dL (ref 6.0–8.3)

## 2024-02-14 ENCOUNTER — Ambulatory Visit: Payer: Self-pay | Admitting: Family

## 2024-02-29 NOTE — Telephone Encounter (Signed)
 Copied from CRM [id number]. Topic: Clinical - Lab/Test Results >> Feb 26, 2024  1:45 PM Anairis L wrote: Reason for CRM: Follow up on labs. >> Feb 26, 2024  1:51 PM Suzen RAMAN wrote: Patient returned call inquiry if provider had an opportunity to review lab results. There are no present chart notations for recent labs completed on 02/12/24. Patient would like a message in her MyChart with provider comments once reviewed.

## 2024-02-29 NOTE — Telephone Encounter (Signed)
**Note De-identified  [place] Obfuscation** Please advise 

## 2024-09-20 ENCOUNTER — Ambulatory Visit
